# Patient Record
Sex: Male | Born: 1970 | Race: White | Hispanic: No | Marital: Married | Smoking: Never smoker
Health system: Southern US, Community
[De-identification: ages and names within clinical notes are randomized; demographics above are authoritative.]

## PROBLEM LIST (undated history)

## (undated) DIAGNOSIS — K222 Esophageal obstruction: Secondary | ICD-10-CM

## (undated) DIAGNOSIS — F32A Depression, unspecified: Secondary | ICD-10-CM

## (undated) DIAGNOSIS — F419 Anxiety disorder, unspecified: Secondary | ICD-10-CM

## (undated) DIAGNOSIS — F329 Major depressive disorder, single episode, unspecified: Secondary | ICD-10-CM

## (undated) DIAGNOSIS — K5790 Diverticulosis of intestine, part unspecified, without perforation or abscess without bleeding: Secondary | ICD-10-CM

## (undated) DIAGNOSIS — K219 Gastro-esophageal reflux disease without esophagitis: Secondary | ICD-10-CM

## (undated) DIAGNOSIS — I10 Essential (primary) hypertension: Secondary | ICD-10-CM

## (undated) DIAGNOSIS — E669 Obesity, unspecified: Secondary | ICD-10-CM

## (undated) DIAGNOSIS — K5792 Diverticulitis of intestine, part unspecified, without perforation or abscess without bleeding: Secondary | ICD-10-CM

## (undated) DIAGNOSIS — G4730 Sleep apnea, unspecified: Secondary | ICD-10-CM

## (undated) DIAGNOSIS — M479 Spondylosis, unspecified: Secondary | ICD-10-CM

## (undated) DIAGNOSIS — R011 Cardiac murmur, unspecified: Secondary | ICD-10-CM

## (undated) DIAGNOSIS — T7840XA Allergy, unspecified, initial encounter: Secondary | ICD-10-CM

## (undated) HISTORY — DX: Diverticulitis of intestine, part unspecified, without perforation or abscess without bleeding: K57.92

## (undated) HISTORY — DX: Depression, unspecified: F32.A

## (undated) HISTORY — DX: Essential (primary) hypertension: I10

## (undated) HISTORY — DX: Major depressive disorder, single episode, unspecified: F32.9

## (undated) HISTORY — DX: Obesity, unspecified: E66.9

## (undated) HISTORY — DX: Anxiety disorder, unspecified: F41.9

## (undated) HISTORY — DX: Allergy, unspecified, initial encounter: T78.40XA

## (undated) HISTORY — DX: Diverticulosis of intestine, part unspecified, without perforation or abscess without bleeding: K57.90

## (undated) HISTORY — PX: ESOPHAGEAL DILATION: SHX303

## (undated) HISTORY — DX: Sleep apnea, unspecified: G47.30

## (undated) HISTORY — PX: SHOULDER SURGERY: SHX246

## (undated) HISTORY — PX: FINGER SURGERY: SHX640

---

## 1999-03-10 ENCOUNTER — Encounter: Payer: Self-pay | Admitting: Neurosurgery

## 1999-03-10 ENCOUNTER — Encounter: Admission: RE | Admit: 1999-03-10 | Discharge: 1999-03-10 | Payer: Self-pay | Admitting: Neurosurgery

## 1999-10-07 ENCOUNTER — Ambulatory Visit (HOSPITAL_COMMUNITY): Admission: RE | Admit: 1999-10-07 | Discharge: 1999-10-07 | Payer: Self-pay | Admitting: Internal Medicine

## 1999-10-07 ENCOUNTER — Encounter: Payer: Self-pay | Admitting: Internal Medicine

## 2000-03-30 ENCOUNTER — Encounter: Payer: Self-pay | Admitting: Neurosurgery

## 2000-03-30 ENCOUNTER — Encounter: Admission: RE | Admit: 2000-03-30 | Discharge: 2000-03-30 | Payer: Self-pay | Admitting: Neurosurgery

## 2000-06-21 ENCOUNTER — Ambulatory Visit (HOSPITAL_COMMUNITY): Admission: RE | Admit: 2000-06-21 | Discharge: 2000-06-21 | Payer: Self-pay | Admitting: Family Medicine

## 2000-06-21 ENCOUNTER — Encounter: Payer: Self-pay | Admitting: Family Medicine

## 2002-07-04 ENCOUNTER — Encounter: Payer: Self-pay | Admitting: Family Medicine

## 2002-07-04 ENCOUNTER — Ambulatory Visit (HOSPITAL_COMMUNITY): Admission: RE | Admit: 2002-07-04 | Discharge: 2002-07-04 | Payer: Self-pay | Admitting: Family Medicine

## 2002-07-18 ENCOUNTER — Ambulatory Visit (HOSPITAL_BASED_OUTPATIENT_CLINIC_OR_DEPARTMENT_OTHER): Admission: RE | Admit: 2002-07-18 | Discharge: 2002-07-18 | Payer: Self-pay | Admitting: Orthopedic Surgery

## 2008-01-09 DIAGNOSIS — K219 Gastro-esophageal reflux disease without esophagitis: Secondary | ICD-10-CM | POA: Insufficient documentation

## 2008-01-09 DIAGNOSIS — K319 Disease of stomach and duodenum, unspecified: Secondary | ICD-10-CM | POA: Insufficient documentation

## 2008-01-10 ENCOUNTER — Telehealth: Payer: Self-pay | Admitting: Internal Medicine

## 2008-01-10 ENCOUNTER — Ambulatory Visit: Payer: Self-pay | Admitting: Internal Medicine

## 2008-01-10 DIAGNOSIS — R1310 Dysphagia, unspecified: Secondary | ICD-10-CM | POA: Insufficient documentation

## 2008-01-10 DIAGNOSIS — K222 Esophageal obstruction: Secondary | ICD-10-CM | POA: Insufficient documentation

## 2008-01-10 DIAGNOSIS — R1319 Other dysphagia: Secondary | ICD-10-CM | POA: Insufficient documentation

## 2008-02-08 ENCOUNTER — Ambulatory Visit: Payer: Self-pay | Admitting: Internal Medicine

## 2008-02-08 ENCOUNTER — Ambulatory Visit (HOSPITAL_COMMUNITY): Admission: RE | Admit: 2008-02-08 | Discharge: 2008-02-08 | Payer: Self-pay | Admitting: Internal Medicine

## 2008-10-12 ENCOUNTER — Encounter: Admission: RE | Admit: 2008-10-12 | Discharge: 2008-10-12 | Payer: Self-pay | Admitting: Neurosurgery

## 2009-04-09 ENCOUNTER — Encounter: Admission: RE | Admit: 2009-04-09 | Discharge: 2009-04-09 | Payer: Self-pay | Admitting: Neurosurgery

## 2009-06-24 ENCOUNTER — Encounter: Admission: RE | Admit: 2009-06-24 | Discharge: 2009-06-24 | Payer: Self-pay | Admitting: Neurosurgery

## 2009-11-20 ENCOUNTER — Ambulatory Visit (HOSPITAL_COMMUNITY): Admission: RE | Admit: 2009-11-20 | Discharge: 2009-11-20 | Payer: Self-pay | Admitting: Family Medicine

## 2009-11-20 ENCOUNTER — Encounter: Payer: Self-pay | Admitting: Cardiology

## 2009-11-20 ENCOUNTER — Ambulatory Visit: Payer: Self-pay | Admitting: Internal Medicine

## 2009-11-20 ENCOUNTER — Ambulatory Visit: Payer: Self-pay

## 2009-11-20 ENCOUNTER — Encounter: Payer: Self-pay | Admitting: Family Medicine

## 2010-04-18 ENCOUNTER — Encounter: Payer: Self-pay | Admitting: Cardiology

## 2010-04-29 ENCOUNTER — Encounter: Payer: Self-pay | Admitting: Cardiology

## 2010-04-30 ENCOUNTER — Encounter: Payer: Self-pay | Admitting: Cardiology

## 2010-04-30 ENCOUNTER — Ambulatory Visit (INDEPENDENT_AMBULATORY_CARE_PROVIDER_SITE_OTHER): Payer: PRIVATE HEALTH INSURANCE | Admitting: Cardiology

## 2010-04-30 DIAGNOSIS — R9431 Abnormal electrocardiogram [ECG] [EKG]: Secondary | ICD-10-CM

## 2010-04-30 NOTE — Progress Notes (Signed)
Exercise Treadmill Test  Pre-Exercise Testing Evaluation Rhythm: normal sinus  Rate: 67   PR:  .16 QRS:  .11  QT:  .40 QTc: .42     Test  Exercise Tolerance Test Ordering MD: Rudi Heap  Interpreting MD:  Angelina Sheriff, MD  Unique Test No: 1  Treadmill:  1  Indication for ETT: Abnormal EKG  Contraindication to ETT: No   Stress Modality: exercise - treadmill  Cardiac Imaging Performed: none  Protocol: standard Bruce - maximal  Max BP: 165/69  Max MPHR (bpm):  178 85% MPR (bpm):  151  MPHR obtained (bpm):  155 % MPHR obtained:  85  Reached 85% MPHR (min:sec): 10:30 Total Exercise Time (min-sec):  11:00  Workload in METS:  13.6 Borg Scale: 17  Reason ETT Terminated:  desired heart rate attained    ST Segment Analysis At Rest: normal ST segments - no evidence of significant ST depression With Exercise: no evidence of significant ST depression  Other Information Arrhythmia:  No Angina during ETT:  absent (0) Quality of ETT:  diagnostic  ETT Interpretation:  normal - no evidence of ischemia by ST analysis  Comments: The patient had an excellent exercise tolerance.  There was no chest pain.  There was an appropriate level of dyspnea.  There were no arrhythmias, a normal heart rate response and normal BP response.  There were no ischemic ST T wave changes and a normal heart rate recovery.  Recommendations: Negative adequate ETT.  No further testing is indicated.  Based on the above I gave the patient a prescription for exercise.

## 2010-05-05 ENCOUNTER — Encounter: Payer: Self-pay | Admitting: Family Medicine

## 2010-06-20 NOTE — Procedures (Signed)
[facility]  Patient:    Roy Lawrence, Roy Lawrence                       MRN: [id number] Proc. Date: 10/07/99 Adm. Date:  [id number] Disc. Date: [id number] Attending:  Estella Husk CC:         Monica Becton, M.D. in [place]   Procedure Report  PROCEDURE:  Esophagogastroduodenoscopy with Savary (guidewire) dilation of the esophagus with fluoroscopic assistance.  SURGEON:  Wilhemina Bonito. Eda Keys., M.D.  INDICATIONS:  Recurrent dysphagia secondary to known peptic stricture.  HISTORY:  This is a 40 year old gentleman with gastroesophageal reflux disease complicated by erosive esophagitis and peptic stricture for which he underwent prior esophageal dilation.  The last such dilation was performed March 06, 1997.  He was dilated to a maximal diameter of 20 mm.  He did well until recently when he was evaluated in the office for recurrent dysphagia.  He also had breakthrough heartburn symptoms for which he has been placed on Prevacid b.i.d.  He is now for upper endoscopy with esophageal dilation.  The nature of the procedure as well as the risks, benefits, and alternatives were reviewed.  He understood and agreed to proceed.  PHYSICAL EXAMINATION:  A well-appearing male in no acute distress.  He is alert and oriented.  Vital signs are stable.  Lungs are clear.  Heart is regular.  Abdomen is soft.  DESCRIPTION OF PROCEDURE:  After informed consent was obtained, the patient was sedated with 100 mg of Demerol and 10 mg of Versed IV.  The Olympus endoscope was passed orally under direct vision into the esophagus.  The esophagus revealed a large caliber fibrous stricture of the gastroesophageal junction.  No active esophagitis or Barretts esophagus.  The endoscope passed beyond this region without resistance.  The stomach revealed a small sliding hiatal hernia.  In addition, multiple small antral erosions.  The duodenal bulb and postbulbar duodenum were  normal.  THERAPY:  After completing the endoscopic survey, a Savary guidewire was placed into the gastric antrum under fluoroscopic control.  Subsequently, a 17, 18, and 19 mm dilators were passed sequentially over the guidewire. Fluoroscopy was utilized throughout each portion of each dilation.  No resistance was encountered with the passage of any dilator.  Scant heme present upon removal of the largest dilator.  The patient tolerated the procedure well.  IMPRESSION:  Gastroesophageal reflux disease complicated by peptic stricture, status post Savary dilation to a maximum diameter of 19 mm.  RECOMMENDATIONS: 1. NPO for two hours and then clear liquids for two hours and then a soft diet    until a.m. 2. Continue Prevacid 30 mg p.o. b.i.d. 3. GI followup as needed. DD:  10/07/99 TD:  10/08/99 Job: [id number]
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
## 2011-07-29 ENCOUNTER — Encounter: Payer: Self-pay | Admitting: Cardiology

## 2011-07-29 ENCOUNTER — Ambulatory Visit (INDEPENDENT_AMBULATORY_CARE_PROVIDER_SITE_OTHER): Payer: PRIVATE HEALTH INSURANCE | Admitting: Cardiology

## 2011-07-29 VITALS — BP 120/90 | HR 80 | Ht 69.0 in | Wt 240.0 lb

## 2011-07-29 DIAGNOSIS — I10 Essential (primary) hypertension: Secondary | ICD-10-CM

## 2011-07-29 DIAGNOSIS — R55 Syncope and collapse: Secondary | ICD-10-CM

## 2011-07-29 DIAGNOSIS — E663 Overweight: Secondary | ICD-10-CM

## 2011-07-29 NOTE — Assessment & Plan Note (Signed)
For now he can continue his Bystolic although if he has worsening symptoms we would need to get rid of this.

## 2011-07-29 NOTE — Assessment & Plan Note (Signed)
I do suspect this was a day let us. However, I would like to apply a 21 day event monitor. We discussed orthostatic symptoms and avoidance. I don't want salt loading fluid load because of his baseline hypertension.

## 2011-07-29 NOTE — Assessment & Plan Note (Signed)
The patient understands the need to lose weight with diet and exercise. We have discussed specific strategies for this.  

## 2011-07-29 NOTE — Patient Instructions (Addendum)
The current medical regimen is effective;  continue present plan and medications.  Your physician has recommended that you wear an event monitor for 21 days. Event monitors are medical devices that record the heart's electrical activity. Doctors most often [place] these monitors to diagnose arrhythmias. Arrhythmias are problems with the speed or rhythm of the heartbeat. The monitor is a small, portable device. You can wear one while you do your normal daily activities. This is usually used to diagnose what is causing palpitations/syncope (passing out).  Follow up will be based on the results of this testing.

## 2011-07-29 NOTE — Progress Notes (Signed)
HPI The patient is referred for evaluation of syncope.  I saw him in the past for an exercise treadmill test. This was done in March of last year and was unremarkable.  He had had an abnormal EKG at work that was not reproducible in the hospital. Because of a childhood murmur he also had an echocardiogram in 2011 was normal.  He presents now because of syncope it has been happening when he laughs. He has to get a good laugh he stops breathing and he becomes presyncopal and at one point he actually lost consciousness. He said he felt things going gray.   His hearing will start to go.  He doesn't feel palpitations. She's had some mild orthostasis and his wife who is a nurse after one episode did take orthostatic blood pressures and noted to be abnormal. He denies any chest pressure, neck or arm discomfort. He's not had any shortness of breath, PND or orthopnea. He's had no weight gain or edema. He does wheeze with exercise which has been chronic with his asthma.  Allergies  Allergen Reactions  . Diovan (Valsartan)   . Quinapril Hcl     Current Outpatient Prescriptions  Medication Sig Dispense Refill  . albuterol (PROVENTIL HFA;VENTOLIN HFA) 108 (90 BASE) MCG/ACT inhaler Inhale 2 puffs into the lungs every 6 (six) hours as needed.        . Ascorbic Acid (VITAMIN C) 1000 MG tablet Take 1,000 mg by mouth 2 (two) times daily.      Marland Kitchen b complex vitamins tablet Take 1 tablet by mouth daily.      . budesonide-formoterol (SYMBICORT) 160-4.5 MCG/ACT inhaler Inhale 2 puffs into the lungs daily.      . Cholecalciferol (VITAMIN D3) 5000 UNITS CAPS Take by mouth.      . dexlansoprazole (DEXILANT) 60 MG capsule Take 60 mg by mouth daily.      Marland Kitchen escitalopram (LEXAPRO) 10 MG tablet Take 10 mg by mouth daily.      . fexofenadine (ALLEGRA) 180 MG tablet Take 180 mg by mouth daily as needed.        . Magnesium Oxide (MAG-OX PO) Take by mouth daily.      . Melatonin 10 MG TABS Take by mouth daily.      .  Multiple Vitamin (MULTIVITAMIN) tablet Take 1 tablet by mouth daily.      . nebivolol (BYSTOLIC) 10 MG tablet Take 10 mg by mouth daily.      . niacin (NIASPAN) 500 MG CR tablet Take 500 mg by mouth. 3 tabs qhs       . omega-3 acid ethyl esters (LOVAZA) 1 G capsule Take 1 g by mouth 2 (two) times daily.        . Testosterone (AXIRON TD) Place 60 mcg onto the skin daily.      . traMADol (ULTRAM) 50 MG tablet Take 50 mg by mouth every 6 (six) hours as needed.        Past Medical History  Diagnosis Date  . Asthma   . Hypertension     Prehypertension    Past Surgical History  Procedure Date  . Shoulder surgery     right x 2    ROS:  As stated in the HPI and negative for all other systems.  PHYSICAL EXAM BP 120/90  Pulse 80  Ht 5\' 9"  (1.753 m)  Wt 240 lb (108.863 kg)  BMI 35.44 kg/m2 GENERAL:  Well appearing HEENT:  Pupils equal round and  reactive, fundi not visualized, oral mucosa unremarkable NECK:  No jugular venous distention, waveform within normal limits, carotid upstroke brisk and symmetric, no bruits, no thyromegaly LYMPHATICS:  No cervical, inguinal adenopathy LUNGS:  Clear to auscultation bilaterally BACK:  No CVA tenderness CHEST:  Unremarkable HEART:  PMI not displaced or sustained,S1 and S2 within normal limits, no S3, no S4, no clicks, no rubs, no murmurs ABD:  Flat, positive bowel sounds normal in frequency in pitch, no bruits, no rebound, no guarding, no midline pulsatile mass, no hepatomegaly, no splenomegaly, obese EXT:  2 plus pulses throughout, no edema, no cyanosis no clubbing SKIN:  No rashes no nodules NEURO:  Cranial nerves II through XII grossly intact, motor grossly intact throughout PSYCH:  Cognitively intact, oriented to person place and time   EKG:  Sinus rhythm, rate 80, axis within normal limits, intervals within normal limits, no acute ST-T wave changes.  ASSESSMENT AND PLAN

## 2012-04-25 ENCOUNTER — Other Ambulatory Visit: Payer: Self-pay | Admitting: Family Medicine

## 2012-04-25 ENCOUNTER — Telehealth: Payer: Self-pay | Admitting: Family Medicine

## 2012-04-25 NOTE — Telephone Encounter (Signed)
Needs rx for lexapro. [facility] [place]

## 2012-04-25 NOTE — Telephone Encounter (Signed)
Rx sent to [facility] in [place] on 04/14/2012

## 2012-04-25 NOTE — Telephone Encounter (Signed)
Pt.notified

## 2012-05-04 ENCOUNTER — Telehealth: Payer: Self-pay | Admitting: Family Medicine

## 2012-05-04 ENCOUNTER — Other Ambulatory Visit: Payer: Self-pay | Admitting: Family Medicine

## 2012-05-04 ENCOUNTER — Other Ambulatory Visit (INDEPENDENT_AMBULATORY_CARE_PROVIDER_SITE_OTHER): Payer: PRIVATE HEALTH INSURANCE

## 2012-05-04 DIAGNOSIS — R5381 Other malaise: Secondary | ICD-10-CM

## 2012-05-04 DIAGNOSIS — R7309 Other abnormal glucose: Secondary | ICD-10-CM

## 2012-05-04 DIAGNOSIS — E291 Testicular hypofunction: Secondary | ICD-10-CM

## 2012-05-04 DIAGNOSIS — I10 Essential (primary) hypertension: Secondary | ICD-10-CM

## 2012-05-04 DIAGNOSIS — K219 Gastro-esophageal reflux disease without esophagitis: Secondary | ICD-10-CM

## 2012-05-04 DIAGNOSIS — E559 Vitamin D deficiency, unspecified: Secondary | ICD-10-CM

## 2012-05-04 DIAGNOSIS — R5383 Other fatigue: Secondary | ICD-10-CM

## 2012-05-04 DIAGNOSIS — E785 Hyperlipidemia, unspecified: Secondary | ICD-10-CM

## 2012-05-04 DIAGNOSIS — E1059 Type 1 diabetes mellitus with other circulatory complications: Secondary | ICD-10-CM

## 2012-05-04 LAB — POCT CBC
Granulocyte percent: 63.8 %G (ref 37–80)
HCT, POC: 50.6 % (ref 43.5–53.7)
Hemoglobin: 16.8 g/dL (ref 14.1–18.1)
Lymph, poc: 1.5 (ref 0.6–3.4)
MCH, POC: 30 pg (ref 27–31.2)
MCHC: 33.2 g/dL (ref 31.8–35.4)
MCV: 90.3 fL (ref 80–97)
MPV: 9.9 fL (ref 0–99.8)
POC Granulocyte: 3.2 (ref 2–6.9)
POC LYMPH PERCENT: 30.1 %L (ref 10–50)
Platelet Count, POC: 104 10*3/uL — AB (ref 142–424)
RBC: 5.6 M/uL (ref 4.69–6.13)
RDW, POC: 12.8 %
WBC: 5 10*3/uL (ref 4.6–10.2)

## 2012-05-04 LAB — POCT GLYCOSYLATED HEMOGLOBIN (HGB A1C): Hemoglobin A1C: 5.1

## 2012-05-04 NOTE — Telephone Encounter (Signed)
Have him come in and be seen. For a visit. Then we can set things up.

## 2012-05-04 NOTE — Progress Notes (Unsigned)
Patient came in today for labs only. Has an appointment next week with doctor moore.

## 2012-05-04 NOTE — Telephone Encounter (Signed)
Please advise 

## 2012-05-05 ENCOUNTER — Other Ambulatory Visit: Payer: Self-pay | Admitting: *Deleted

## 2012-05-05 LAB — BASIC METABOLIC PANEL
BUN: 15 mg/dL (ref 6–23)
CO2: 31 mEq/L (ref 19–32)
Calcium: 9.6 mg/dL (ref 8.4–10.5)
Chloride: 103 mEq/L (ref 96–112)
Creat: 1.07 mg/dL (ref 0.50–1.35)
Glucose, Bld: 95 mg/dL (ref 70–99)
Potassium: 4.7 mEq/L (ref 3.5–5.3)
Sodium: 140 mEq/L (ref 135–145)

## 2012-05-05 LAB — HEPATIC FUNCTION PANEL
ALT: 28 U/L (ref 0–53)
AST: 20 U/L (ref 0–37)
Albumin: 4.7 g/dL (ref 3.5–5.2)
Alkaline Phosphatase: 47 U/L (ref 39–117)
Bilirubin, Direct: 0.2 mg/dL (ref 0.0–0.3)
Indirect Bilirubin: 0.5 mg/dL (ref 0.0–0.9)
Total Bilirubin: 0.7 mg/dL (ref 0.3–1.2)
Total Protein: 6.7 g/dL (ref 6.0–8.3)

## 2012-05-05 LAB — VITAMIN D 25 HYDROXY (VIT D DEFICIENCY, FRACTURES): Vit D, 25-Hydroxy: 46 ng/mL (ref 30–89)

## 2012-05-05 MED ORDER — ALPRAZOLAM 0.5 MG PO TABS: 0.5000 mg | ORAL_TABLET | Freq: Two times a day (BID) | ORAL | Status: DC | PRN

## 2012-05-05 NOTE — Telephone Encounter (Signed)
Pt requesting refill on xanax, last seen 11/17/11, last filled on that date. This will print, so have kay to call pt. At [phone] to pick up

## 2012-05-06 LAB — NMR LIPOPROFILE WITH LIPIDS
Cholesterol, Total: 132 mg/dL (ref <200)
HDL Particle Number: 28 umol/L — ABNORMAL LOW (ref >=30.5)
HDL Size: 8.5 nm — ABNORMAL LOW (ref >=9.2)
HDL-C: 38 mg/dL — ABNORMAL LOW (ref >=40)
LDL (calc): 72 mg/dL (ref <100)
LDL Particle Number: 927 nmol/L (ref <1000)
LDL Size: 20.4 nm — ABNORMAL LOW (ref >20.5)
LP-IR Score: 46 — ABNORMAL HIGH (ref <=45)
Large HDL-P: 1.3 umol/L — ABNORMAL LOW (ref >=4.8)
Large VLDL-P: <0.8 nmol/L (ref <=2.7)
Small LDL Particle Number: 442 nmol/L (ref <=527)
Triglycerides: 111 mg/dL (ref <150)
VLDL Size: 41.7 nm (ref <=46.6)

## 2012-05-06 LAB — TESTOSTERONE, TOTAL AND FREE DIRECT MEASURE
Free Testosterone, Direct: 13.5 pg/mL (ref 3.8–34.2)
Testosterone: 723 ng/dL (ref 300–890)

## 2012-05-06 NOTE — Telephone Encounter (Signed)
Script approved by Dr. Christell Constant and called to [facility] in [place].

## 2012-05-09 ENCOUNTER — Encounter: Payer: Self-pay | Admitting: Family Medicine

## 2012-05-09 ENCOUNTER — Ambulatory Visit (INDEPENDENT_AMBULATORY_CARE_PROVIDER_SITE_OTHER): Payer: PRIVATE HEALTH INSURANCE | Admitting: Family Medicine

## 2012-05-09 ENCOUNTER — Ambulatory Visit (INDEPENDENT_AMBULATORY_CARE_PROVIDER_SITE_OTHER): Payer: PRIVATE HEALTH INSURANCE

## 2012-05-09 VITALS — BP 101/65 | HR 74 | Temp 97.8°F | Ht 69.0 in | Wt 245.0 lb

## 2012-05-09 DIAGNOSIS — G8929 Other chronic pain: Secondary | ICD-10-CM

## 2012-05-09 DIAGNOSIS — M25579 Pain in unspecified ankle and joints of unspecified foot: Secondary | ICD-10-CM

## 2012-05-09 DIAGNOSIS — K219 Gastro-esophageal reflux disease without esophagitis: Secondary | ICD-10-CM

## 2012-05-09 DIAGNOSIS — E349 Endocrine disorder, unspecified: Secondary | ICD-10-CM | POA: Insufficient documentation

## 2012-05-09 DIAGNOSIS — M722 Plantar fascial fibromatosis: Secondary | ICD-10-CM

## 2012-05-09 DIAGNOSIS — I10 Essential (primary) hypertension: Secondary | ICD-10-CM

## 2012-05-09 DIAGNOSIS — E291 Testicular hypofunction: Secondary | ICD-10-CM

## 2012-05-09 LAB — PSA: PSA: 0.84 ng/mL (ref <=4.00)

## 2012-05-09 NOTE — Progress Notes (Signed)
  Subjective:    Patient ID: Roy Lawrence, male    DOB: 11-09-1970, 42 y.o.   MRN: [id number]  HPI This patient presents for recheck of multiple medical problems. No one accompanies the patient today.  Patient Active Problem List  Diagnosis  . ESOPHAGEAL STRICTURE  . GERD  . PEPTIC STRICTURE  . DYSPHAGIA UNSPECIFIED  . DYSPHAGIA  . Syncope  . HTN (hypertension)  . Overweight  . Testosterone deficiency    In addition, see ROS, he also has spondylolisthesis and this is followed by Dr. Channing Mutters q 6 months  The allergies, current medications, past medical history, surgical history, family and social history are reviewed.  Immunizations reviewed.  Health maintenance reviewed.  The following items are outstanding: none      Review of Systems  HENT: Positive for congestion, sneezing (due to allergies), postnasal drip and sinus pressure. Negative for ear pain.   Eyes: Negative.   Respiratory: Positive for wheezing (intermitent). Negative for cough and shortness of breath.   Cardiovascular: Negative.   Gastrointestinal: Negative.   Genitourinary: Negative.   Musculoskeletal: Positive for arthralgias (knees,ankles, R hip, R foot) and gait problem (due to foot and hip pain).  Skin: Negative.   Allergic/Immunologic: Positive for environmental allergies.  Neurological: Negative.   Psychiatric/Behavioral: Positive for sleep disturbance (nightly, shift work).       Objective:   Physical Exam BP 101/65  Pulse 74  Temp(Src) 97.8 F (36.6 C) (Oral)  Ht 5\' 9"  (1.753 m)  Wt 245 lb (111.131 kg)  BMI 36.16 kg/m2  The patient appeared well nourished and normally developed, alert and oriented to time and place. Speech, behavior and judgement appear normal. Vital signs as documented.  Head exam is unremarkable. No scleral icterus or pallor noted.  Neck is without jugular venous distension, thyromegally, or carotid bruits. Carotid upstrokes are brisk bilaterally. No cervical  adenopathy. Lungs are clear anteriorly and posteriorly to auscultation. Normal respiratory effort. Cardiac exam reveals regular rate and rhythm. First and second heart sounds normal. No murmurs, rubs or gallops.  Abdominal exam reveals normal bowl sounds, no masses, no organomegaly and no aortic enlargement. No inguinal adenopathy. Extremities are nonedematous and both femoral and pedal pulses are normal. Skin without pallor or jaundice.  Warm and dry, without rash. Neurologic exam reveals normal deep tendon reflexes and normal sensation.   [facility] reading (PRIMARY) by  Dr.Don Christell Constant  x-ray appears normal, there is no  heel spur present                                       Assessment & Plan:  1. GERD Continue PPI  2. HTN (hypertension) Continue current meds  3. Testosterone deficiency  4. Heel pain, chronic, right - DG Foot Complete Right; Future Moist heat and consider firm sole shoes as mentioned  5. Plantar fasciitis of right foot As above

## 2012-05-09 NOTE — Patient Instructions (Addendum)
FOBT already has, will return Continue current meds and therapeutic lifestyle changes

## 2012-05-19 NOTE — Telephone Encounter (Signed)
Resolved.  Patient was seen for an office visit.

## 2012-05-21 ENCOUNTER — Other Ambulatory Visit: Payer: Self-pay | Admitting: Family Medicine

## 2012-05-23 ENCOUNTER — Other Ambulatory Visit: Payer: Self-pay | Admitting: Nurse Practitioner

## 2012-05-23 NOTE — Telephone Encounter (Signed)
LAST OV 10/13 

## 2012-05-27 ENCOUNTER — Ambulatory Visit: Payer: PRIVATE HEALTH INSURANCE | Admitting: Nurse Practitioner

## 2012-06-16 ENCOUNTER — Other Ambulatory Visit: Payer: Self-pay | Admitting: Family Medicine

## 2012-06-17 NOTE — Telephone Encounter (Signed)
This is okay with a refills designated

## 2012-06-17 NOTE — Telephone Encounter (Signed)
Last filled 05/13/12,

## 2012-06-18 ENCOUNTER — Other Ambulatory Visit: Payer: Self-pay | Admitting: Family Medicine

## 2012-06-20 ENCOUNTER — Other Ambulatory Visit: Payer: Self-pay | Admitting: Family Medicine

## 2012-06-21 NOTE — Telephone Encounter (Signed)
RX called into [facility] Pt notified

## 2012-07-16 ENCOUNTER — Other Ambulatory Visit: Payer: Self-pay | Admitting: Family Medicine

## 2012-07-19 ENCOUNTER — Other Ambulatory Visit: Payer: Self-pay | Admitting: *Deleted

## 2012-07-19 MED ORDER — NIACIN ER (ANTIHYPERLIPIDEMIC) 500 MG PO TBCR: 500.0000 mg | EXTENDED_RELEASE_TABLET | Freq: Four times a day (QID) | ORAL | Status: DC

## 2012-08-22 ENCOUNTER — Other Ambulatory Visit: Payer: Self-pay | Admitting: Family Medicine

## 2012-08-24 NOTE — Telephone Encounter (Signed)
These are OK to refill 

## 2012-08-24 NOTE — Telephone Encounter (Signed)
Last seen 05/09/12  DWM   If approved print and have nurse call patient to pick up

## 2012-08-25 ENCOUNTER — Other Ambulatory Visit: Payer: Self-pay | Admitting: Family Medicine

## 2012-08-25 NOTE — Telephone Encounter (Signed)
Pt notified Rx's sent to pharmacy. 

## 2012-10-13 ENCOUNTER — Other Ambulatory Visit: Payer: Self-pay | Admitting: Family Medicine

## 2012-10-14 NOTE — Telephone Encounter (Signed)
Last seen 05/09/12  DWM  If approved route to nurse to call into [facility]

## 2012-10-15 NOTE — Telephone Encounter (Signed)
This is ok with 5 refills

## 2012-10-18 NOTE — Telephone Encounter (Signed)
Called into [facility] in [place]

## 2012-10-31 ENCOUNTER — Other Ambulatory Visit: Payer: Self-pay | Admitting: Family Medicine

## 2012-11-09 ENCOUNTER — Ambulatory Visit (INDEPENDENT_AMBULATORY_CARE_PROVIDER_SITE_OTHER): Payer: PRIVATE HEALTH INSURANCE

## 2012-11-09 ENCOUNTER — Ambulatory Visit (INDEPENDENT_AMBULATORY_CARE_PROVIDER_SITE_OTHER): Payer: PRIVATE HEALTH INSURANCE | Admitting: Family Medicine

## 2012-11-09 ENCOUNTER — Encounter: Payer: Self-pay | Admitting: Family Medicine

## 2012-11-09 VITALS — BP 111/72 | HR 52 | Temp 97.0°F | Ht 69.0 in | Wt 247.0 lb

## 2012-11-09 DIAGNOSIS — K219 Gastro-esophageal reflux disease without esophagitis: Secondary | ICD-10-CM

## 2012-11-09 DIAGNOSIS — Z8709 Personal history of other diseases of the respiratory system: Secondary | ICD-10-CM

## 2012-11-09 DIAGNOSIS — E291 Testicular hypofunction: Secondary | ICD-10-CM

## 2012-11-09 DIAGNOSIS — F411 Generalized anxiety disorder: Secondary | ICD-10-CM

## 2012-11-09 DIAGNOSIS — I10 Essential (primary) hypertension: Secondary | ICD-10-CM

## 2012-11-09 DIAGNOSIS — M545 Low back pain, unspecified: Secondary | ICD-10-CM

## 2012-11-09 DIAGNOSIS — M4316 Spondylolisthesis, lumbar region: Secondary | ICD-10-CM | POA: Insufficient documentation

## 2012-11-09 DIAGNOSIS — E559 Vitamin D deficiency, unspecified: Secondary | ICD-10-CM

## 2012-11-09 DIAGNOSIS — E349 Endocrine disorder, unspecified: Secondary | ICD-10-CM

## 2012-11-09 DIAGNOSIS — R1319 Other dysphagia: Secondary | ICD-10-CM

## 2012-11-09 DIAGNOSIS — F419 Anxiety disorder, unspecified: Secondary | ICD-10-CM | POA: Insufficient documentation

## 2012-11-09 DIAGNOSIS — M4310 Spondylolisthesis, site unspecified: Secondary | ICD-10-CM

## 2012-11-09 LAB — POCT CBC
Granulocyte percent: 57.4 %G (ref 37–80)
HCT, POC: 49.8 % (ref 43.5–53.7)
Hemoglobin: 16.7 g/dL (ref 14.1–18.1)
Lymph, poc: 1.9 (ref 0.6–3.4)
MCH, POC: 29.5 pg (ref 27–31.2)
MCHC: 33.5 g/dL (ref 31.8–35.4)
MCV: 87.8 fL (ref 80–97)
MPV: 9.3 fL (ref 0–99.8)
POC Granulocyte: 3.1 (ref 2–6.9)
POC LYMPH PERCENT: 35.6 %L (ref 10–50)
Platelet Count, POC: 114 10*3/uL — AB (ref 142–424)
RBC: 5.7 M/uL (ref 4.69–6.13)
RDW, POC: 12.4 %
WBC: 5.4 10*3/uL (ref 4.6–10.2)

## 2012-11-09 NOTE — Progress Notes (Signed)
Subjective:    Patient ID: Roy Lawrence, male    DOB: 08/22/1970, 42 y.o.   MRN: [id number]  HPI Pt here for follow up and management of chronic medical problems. Patient is doing well with his multiple problems. He visits with the chiropractor and this has helped his back. His asthma has been stable using the long acting bronchodilator with rescue help from albuterol. He visits with the neurosurgeon about every 6 months for his spondylolisthesis.     Patient Active Problem List   Diagnosis Date Noted  . Testosterone deficiency 05/09/2012  . Syncope 07/29/2011  . HTN (hypertension) 07/29/2011  . Overweight 07/29/2011  . ESOPHAGEAL STRICTURE 01/10/2008  . DYSPHAGIA UNSPECIFIED 01/10/2008  . DYSPHAGIA 01/10/2008  . GERD 01/09/2008  . PEPTIC STRICTURE 01/09/2008   Outpatient Encounter Prescriptions as of 11/09/2012  Medication Sig Dispense Refill  . albuterol (VENTOLIN HFA) 108 (90 BASE) MCG/ACT inhaler Inhale 1 puff into the lungs every 6 (six) hours as needed for wheezing.  18 each  3  . ALPRAZolam (XANAX) 0.5 MG tablet TAKE ONE TABLET BY MOUTH TWICE DAILY AS NEEDED  60 tablet  0  . Ascorbic Acid (VITAMIN C) 1000 MG tablet Take 1,000 mg by mouth 2 (two) times daily.      Randa Ngo 30 MG/ACT SOLN APPLY TWO PUMPS OF SOLUTION TOPICALLY TO EACH ARM ONCE A DAY  90 mL  0  . budesonide-formoterol (SYMBICORT) 160-4.5 MCG/ACT inhaler Inhale 2 puffs into the lungs daily.      Marland Kitchen BYSTOLIC 20 MG TABS TAKE ONE TABLET BY MOUTH EVERY DAY  30 each  2  . Cholecalciferol (VITAMIN D3) 5000 UNITS CAPS Take by mouth.      . escitalopram (LEXAPRO) 20 MG tablet One half to one tablet daily as directed  30 tablet  5  . fexofenadine (ALLEGRA) 180 MG tablet Take 180 mg by mouth daily as needed.        . Magnesium Oxide (MAG-OX PO) Take by mouth daily.      . Melatonin 10 MG TABS Take by mouth daily.      . Multiple Vitamin (MULTIVITAMIN) tablet Take 1 tablet by mouth daily.      Marland Kitchen omega-3 acid ethyl esters  (LOVAZA) 1 G capsule TAKE ONE CAPSULE BY MOUTH TWICE DAILY  60 capsule  5  . traMADol (ULTRAM) 50 MG tablet Take 50 mg by mouth every 6 (six) hours as needed.      . [DISCONTINUED] b complex vitamins tablet Take 1 tablet by mouth daily.      . [DISCONTINUED] niacin (NIASPAN) 500 MG CR tablet Take 1 tablet (500 mg total) by mouth 4 (four) times daily.  120 tablet  4  . DEXILANT 60 MG capsule TAKE ONE CAPSULE BY MOUTH EVERY DAY  30 capsule  5  . [DISCONTINUED] LYSINE HCL PO Take 1 tablet by mouth daily.       No facility-administered encounter medications on file as of 11/09/2012.    Review of Systems  Constitutional: Negative.   HENT: Negative.   Eyes: Negative.   Respiratory: Negative.   Cardiovascular: Negative.   Gastrointestinal: Negative.   Endocrine: Negative.   Genitourinary: Negative.   Musculoskeletal: Negative.   Skin: Negative.   Allergic/Immunologic: Negative.   Neurological: Negative.   Hematological: Negative.   Psychiatric/Behavioral: Negative.        Objective:   Physical Exam  Nursing note and vitals reviewed. Constitutional: He is oriented to person, place, and time. He  appears well-developed and well-nourished. No distress.  Calm  HENT:  Head: Normocephalic and atraumatic.  Right Ear: External ear normal.  Left Ear: External ear normal.  Nose: Nose normal.  Mouth/Throat: Oropharynx is clear and moist. No oropharyngeal exudate.  Eyes: Conjunctivae and EOM are normal. Pupils are equal, round, and reactive to light. Right eye exhibits no discharge. Left eye exhibits no discharge. No scleral icterus.  Neck: Normal range of motion. Neck supple. No tracheal deviation present. No thyromegaly present.  Cardiovascular: Normal rate, regular rhythm, normal heart sounds and intact distal pulses.  Exam reveals no gallop and no friction rub.   No murmur heard. At 72 per minute  Pulmonary/Chest: Effort normal and breath sounds normal. No respiratory distress. He has no  wheezes. He has no rales.  Abdominal: Soft. Bowel sounds are normal. He exhibits no distension and no mass. There is no tenderness. There is no rebound and no guarding.  Mild obesity  Genitourinary: Rectum normal, prostate normal and penis normal.  No rectal masses , no prostate nodules, no prostate enlargement, no hernia  Musculoskeletal: Normal range of motion. He exhibits no edema and no tenderness.  Lymphadenopathy:    He has no cervical adenopathy.  Neurological: He is alert and oriented to person, place, and time. He has normal reflexes. No cranial nerve deficit.  Skin: Skin is warm and dry. No rash noted. No erythema. No pallor.  Psychiatric: He has a normal mood and affect. His behavior is normal. Judgment and thought content normal.   BP 111/72  Pulse 52  Temp(Src) 97 F (36.1 C) (Oral)  Ht 5\' 9"  (1.753 m)  Wt 247 lb (112.038 kg)  BMI 36.46 kg/m2  [facility] reading (PRIMARY) by  Dr.Moore; chest x-ray:  Within normal limit                                    Assessment & Plan:    1. GERD   2. DYSPHAGIA   3. HTN (hypertension)   4. Testosterone deficiency   5. Vitamin D deficiency   6. Anxiety   7. History of asthma   8. Low back pain syndrome   9. Spondylolisthesis of lumbar region    Orders Placed This Encounter  Procedures  . DG Chest 2 View    Standing Status: Future     Number of Occurrences:      Standing Expiration Date: 01/09/2014    Order Specific Question:  Reason for Exam (SYMPTOM  OR DIAGNOSIS REQUIRED)    Answer:  htn    Order Specific Question:  Preferred imaging location?    Answer:  Internal  . Hepatic function panel  . BMP8+EGFR  . NMR, lipoprofile  . Testosterone,Free and Total  . Vit D  25 hydroxy (rtn osteoporosis monitoring)  . PSA, total and free  . POCT CBC   Patient Instructions  Continue current medications. Continue good therapeutic lifestyle changes.  Fall precautions discussed with patient. Schedule your flu vaccine the first of  October. Follow up as planned and earlier as needed.     Nyra Capes MD

## 2012-11-09 NOTE — Patient Instructions (Signed)
Continue current medications. Continue good therapeutic lifestyle changes.  Fall precautions discussed with patient. Schedule your flu vaccine the first of October. Follow up as planned and earlier as needed.

## 2012-11-11 ENCOUNTER — Other Ambulatory Visit (INDEPENDENT_AMBULATORY_CARE_PROVIDER_SITE_OTHER): Payer: PRIVATE HEALTH INSURANCE

## 2012-11-11 DIAGNOSIS — Z1212 Encounter for screening for malignant neoplasm of rectum: Secondary | ICD-10-CM

## 2012-11-11 NOTE — Progress Notes (Signed)
Pt dropped of FOBT only

## 2012-11-12 LAB — HEPATIC FUNCTION PANEL
ALT: 64 IU/L — ABNORMAL HIGH (ref 0–44)
AST: 34 IU/L (ref 0–40)
Albumin: 4.7 g/dL (ref 3.5–5.5)
Alkaline Phosphatase: 52 IU/L (ref 39–117)
Bilirubin, Direct: 0.13 mg/dL (ref 0.00–0.40)
Total Bilirubin: 0.5 mg/dL (ref 0.0–1.2)
Total Protein: 6.6 g/dL (ref 6.0–8.5)

## 2012-11-12 LAB — BMP8+EGFR
BUN/Creatinine Ratio: 13 (ref 9–20)
BUN: 15 mg/dL (ref 6–24)
CO2: 27 mmol/L (ref 18–29)
Calcium: 9.6 mg/dL (ref 8.7–10.2)
Chloride: 100 mmol/L (ref 97–108)
Creatinine, Ser: 1.18 mg/dL (ref 0.76–1.27)
GFR calc Af Amer: 86 mL/min/{1.73_m2} (ref >59)
GFR calc non Af Amer: 75 mL/min/{1.73_m2} (ref >59)
Glucose: 87 mg/dL (ref 65–99)
Potassium: 4.7 mmol/L (ref 3.5–5.2)
Sodium: 141 mmol/L (ref 134–144)

## 2012-11-12 LAB — TESTOSTERONE,FREE AND TOTAL
Testosterone, Free: 24 pg/mL — ABNORMAL HIGH (ref 6.8–21.5)
Testosterone: 802 ng/dL (ref 348–1197)

## 2012-11-12 LAB — NMR, LIPOPROFILE
Cholesterol: 161 mg/dL (ref <200)
HDL Cholesterol by NMR: 38 mg/dL — ABNORMAL LOW (ref >=40)
HDL Particle Number: 28.1 umol/L — ABNORMAL LOW (ref >=30.5)
LDL Particle Number: 1493 nmol/L — ABNORMAL HIGH (ref <1000)
LDL Size: 19.9 nm — ABNORMAL LOW (ref >20.5)
LDLC SERPL CALC-MCNC: 96 mg/dL (ref <100)
LP-IR Score: 70 — ABNORMAL HIGH (ref <=45)
Small LDL Particle Number: 1086 nmol/L — ABNORMAL HIGH (ref <=527)
Triglycerides by NMR: 137 mg/dL (ref <150)

## 2012-11-12 LAB — PSA, TOTAL AND FREE
PSA, Free Pct: 42.2 %
PSA, Free: 0.38 ng/mL
PSA: 0.9 ng/mL (ref 0.0–4.0)

## 2012-11-12 LAB — VITAMIN D 25 HYDROXY (VIT D DEFICIENCY, FRACTURES): Vit D, 25-Hydroxy: 45 ng/mL (ref 30.0–100.0)

## 2012-11-13 LAB — FECAL OCCULT BLOOD, IMMUNOCHEMICAL: Fecal Occult Bld: NEGATIVE

## 2012-11-15 ENCOUNTER — Encounter: Payer: Self-pay | Admitting: *Deleted

## 2012-11-16 ENCOUNTER — Telehealth: Payer: Self-pay | Admitting: Family Medicine

## 2012-11-18 NOTE — Telephone Encounter (Signed)
Lm again today 

## 2012-11-23 ENCOUNTER — Telehealth: Payer: Self-pay | Admitting: Family Medicine

## 2012-11-24 NOTE — Telephone Encounter (Signed)
Spoke with pt regarding lab results and results released to my chart

## 2012-11-26 ENCOUNTER — Other Ambulatory Visit: Payer: Self-pay | Admitting: Family Medicine

## 2012-11-29 NOTE — Telephone Encounter (Signed)
This is okay for 6 more months

## 2012-11-29 NOTE — Telephone Encounter (Signed)
Last filled 08/22/12, last seen 11/09/12. Route to pool A, call into [place] [phone]

## 2012-11-30 ENCOUNTER — Other Ambulatory Visit: Payer: Self-pay | Admitting: Family Medicine

## 2012-11-30 NOTE — Telephone Encounter (Signed)
RX for Axiron called into [facility]

## 2012-12-01 NOTE — Telephone Encounter (Signed)
Last seen 10/8 14  DWM  If approved route to nurse to phone into [facility]  [phone]

## 2012-12-04 ENCOUNTER — Emergency Department (INDEPENDENT_AMBULATORY_CARE_PROVIDER_SITE_OTHER)
Admission: EM | Admit: 2012-12-04 | Discharge: 2012-12-04 | Disposition: A | Discharge status: Nursing Home (Includes ICF) | Payer: PRIVATE HEALTH INSURANCE | Source: Home / Self Care | Attending: Emergency Medicine | Admitting: Emergency Medicine

## 2012-12-04 ENCOUNTER — Encounter (HOSPITAL_COMMUNITY): Payer: Self-pay | Admitting: Emergency Medicine

## 2012-12-04 ENCOUNTER — Emergency Department (HOSPITAL_COMMUNITY): Payer: PRIVATE HEALTH INSURANCE

## 2012-12-04 ENCOUNTER — Emergency Department (HOSPITAL_COMMUNITY)
Admission: EM | Admit: 2012-12-04 | Discharge: 2012-12-04 | Disposition: A | Discharge status: Home / Self Care | Payer: PRIVATE HEALTH INSURANCE | Attending: Emergency Medicine | Admitting: Emergency Medicine

## 2012-12-04 DIAGNOSIS — J45909 Unspecified asthma, uncomplicated: Secondary | ICD-10-CM | POA: Insufficient documentation

## 2012-12-04 DIAGNOSIS — K5732 Diverticulitis of large intestine without perforation or abscess without bleeding: Secondary | ICD-10-CM

## 2012-12-04 DIAGNOSIS — I10 Essential (primary) hypertension: Secondary | ICD-10-CM | POA: Insufficient documentation

## 2012-12-04 DIAGNOSIS — K5792 Diverticulitis of intestine, part unspecified, without perforation or abscess without bleeding: Secondary | ICD-10-CM

## 2012-12-04 DIAGNOSIS — Z79899 Other long term (current) drug therapy: Secondary | ICD-10-CM | POA: Insufficient documentation

## 2012-12-04 DIAGNOSIS — IMO0002 Reserved for concepts with insufficient information to code with codable children: Secondary | ICD-10-CM | POA: Insufficient documentation

## 2012-12-04 HISTORY — DX: Cardiac murmur, unspecified: R01.1

## 2012-12-04 HISTORY — DX: Gastro-esophageal reflux disease without esophagitis: K21.9

## 2012-12-04 HISTORY — DX: Spondylosis, unspecified: M47.9

## 2012-12-04 HISTORY — DX: Esophageal obstruction: K22.2

## 2012-12-04 LAB — CBC WITH DIFFERENTIAL/PLATELET
Basophils Absolute: 0 10*3/uL (ref 0.0–0.1)
Basophils Relative: 0 % (ref 0–1)
Eosinophils Absolute: 0 10*3/uL (ref 0.0–0.7)
Eosinophils Relative: 0 % (ref 0–5)
HCT: 46.9 % (ref 39.0–52.0)
Hemoglobin: 17.1 g/dL — ABNORMAL HIGH (ref 13.0–17.0)
Lymphocytes Relative: 17 % (ref 12–46)
Lymphs Abs: 1.8 10*3/uL (ref 0.7–4.0)
MCH: 30.5 pg (ref 26.0–34.0)
MCHC: 36.5 g/dL — ABNORMAL HIGH (ref 30.0–36.0)
MCV: 83.8 fL (ref 78.0–100.0)
Monocytes Absolute: 0.8 10*3/uL (ref 0.1–1.0)
Monocytes Relative: 7 % (ref 3–12)
Neutro Abs: 8.3 10*3/uL — ABNORMAL HIGH (ref 1.7–7.7)
Neutrophils Relative %: 76 % (ref 43–77)
Platelets: 130 10*3/uL — ABNORMAL LOW (ref 150–400)
RBC: 5.6 MIL/uL (ref 4.22–5.81)
RDW: 11.8 % (ref 11.5–15.5)
WBC: 10.9 10*3/uL — ABNORMAL HIGH (ref 4.0–10.5)

## 2012-12-04 LAB — COMPREHENSIVE METABOLIC PANEL
ALT: 73 U/L — ABNORMAL HIGH (ref 0–53)
AST: 33 U/L (ref 0–37)
Albumin: 4.4 g/dL (ref 3.5–5.2)
Alkaline Phosphatase: 64 U/L (ref 39–117)
BUN: 13 mg/dL (ref 6–23)
CO2: 28 mEq/L (ref 19–32)
Calcium: 9.5 mg/dL (ref 8.4–10.5)
Chloride: 100 mEq/L (ref 96–112)
Creatinine, Ser: 1.02 mg/dL (ref 0.50–1.35)
GFR calc Af Amer: >90 mL/min (ref >90)
GFR calc non Af Amer: 88 mL/min — ABNORMAL LOW (ref >90)
Glucose, Bld: 92 mg/dL (ref 70–99)
Potassium: 3.9 mEq/L (ref 3.5–5.1)
Sodium: 138 mEq/L (ref 135–145)
Total Bilirubin: 0.6 mg/dL (ref 0.3–1.2)
Total Protein: 7.3 g/dL (ref 6.0–8.3)

## 2012-12-04 LAB — URINALYSIS, ROUTINE W REFLEX MICROSCOPIC
Bilirubin Urine: NEGATIVE
Glucose, UA: NEGATIVE mg/dL
Hgb urine dipstick: NEGATIVE
Ketones, ur: NEGATIVE mg/dL
Leukocytes, UA: NEGATIVE
Nitrite: NEGATIVE
Protein, ur: NEGATIVE mg/dL
Specific Gravity, Urine: 1.013 (ref 1.005–1.030)
Urobilinogen, UA: 0.2 mg/dL (ref 0.0–1.0)
pH: 6.5 (ref 5.0–8.0)

## 2012-12-04 LAB — POCT URINALYSIS DIP (DEVICE)
Bilirubin Urine: NEGATIVE
Glucose, UA: NEGATIVE mg/dL
Hgb urine dipstick: NEGATIVE
Ketones, ur: NEGATIVE mg/dL
Leukocytes, UA: NEGATIVE
Nitrite: NEGATIVE
Protein, ur: NEGATIVE mg/dL
Specific Gravity, Urine: 1.015 (ref 1.005–1.030)
Urobilinogen, UA: 0.2 mg/dL (ref 0.0–1.0)
pH: 6.5 (ref 5.0–8.0)

## 2012-12-04 LAB — LIPASE, BLOOD: Lipase: 30 U/L (ref 11–59)

## 2012-12-04 MED ORDER — IOHEXOL 300 MG/ML  SOLN: 25.0000 mL | INTRAMUSCULAR | Status: DC | PRN

## 2012-12-04 MED ORDER — ONDANSETRON 4 MG PO TBDP
8.0000 mg | ORAL_TABLET | Freq: Once | ORAL | Status: AC
  Administered 2012-12-04: 8 mg via ORAL

## 2012-12-04 MED ORDER — CIPROFLOXACIN HCL 500 MG PO TABS: 500.0000 mg | ORAL_TABLET | Freq: Two times a day (BID) | ORAL | Status: DC

## 2012-12-04 MED ORDER — CIPROFLOXACIN IN D5W 400 MG/200ML IV SOLN
400.0000 mg | Freq: Once | INTRAVENOUS | Status: AC
  Administered 2012-12-04: 400 mg via INTRAVENOUS
  Filled 2012-12-04: qty 200

## 2012-12-04 MED ORDER — ONDANSETRON 4 MG PO TBDP
ORAL_TABLET | ORAL | Status: AC
  Filled 2012-12-04: qty 1

## 2012-12-04 MED ORDER — HYDROMORPHONE HCL PF 1 MG/ML IJ SOLN
1.0000 mg | Freq: Once | INTRAMUSCULAR | Status: AC
  Administered 2012-12-04: 1 mg via INTRAVENOUS
  Filled 2012-12-04: qty 1

## 2012-12-04 MED ORDER — METRONIDAZOLE IN NACL 5-0.79 MG/ML-% IV SOLN
500.0000 mg | Freq: Once | INTRAVENOUS | Status: AC
  Administered 2012-12-04: 500 mg via INTRAVENOUS
  Filled 2012-12-04: qty 100

## 2012-12-04 MED ORDER — IOHEXOL 300 MG/ML  SOLN
100.0000 mL | Freq: Once | INTRAMUSCULAR | Status: AC | PRN
  Administered 2012-12-04: 100 mL via INTRAVENOUS

## 2012-12-04 MED ORDER — METRONIDAZOLE 500 MG PO TABS: 500.0000 mg | ORAL_TABLET | Freq: Three times a day (TID) | ORAL | Status: DC

## 2012-12-04 MED ORDER — SODIUM CHLORIDE 0.9 % IV SOLN
Freq: Once | INTRAVENOUS | Status: AC
  Administered 2012-12-04: 18:00:00 via INTRAVENOUS

## 2012-12-04 MED ORDER — SODIUM CHLORIDE 0.9 % IV BOLUS (SEPSIS)
1000.0000 mL | Freq: Once | INTRAVENOUS | Status: AC
  Administered 2012-12-04: 1000 mL via INTRAVENOUS

## 2012-12-04 MED ORDER — HYDROMORPHONE HCL PF 1 MG/ML IJ SOLN
INTRAMUSCULAR | Status: AC
  Filled 2012-12-04: qty 2

## 2012-12-04 MED ORDER — OXYCODONE-ACETAMINOPHEN 5-325 MG PO TABS: 1.0000 | ORAL_TABLET | ORAL | Status: DC | PRN

## 2012-12-04 MED ORDER — HYDROMORPHONE HCL PF 1 MG/ML IJ SOLN
2.0000 mg | Freq: Once | INTRAMUSCULAR | Status: AC
  Administered 2012-12-04: 2 mg via INTRAMUSCULAR

## 2012-12-04 MED ORDER — KETOROLAC TROMETHAMINE 30 MG/ML IJ SOLN
30.0000 mg | Freq: Once | INTRAMUSCULAR | Status: AC
  Administered 2012-12-04: 30 mg via INTRAVENOUS
  Filled 2012-12-04: qty 1

## 2012-12-04 NOTE — ED Provider Notes (Signed)
Chief Complaint:   Chief Complaint  Patient presents with  . 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 It does not radiate. He's had mild pressure in this area for the past 2 days. He denies any fever, chills, nausea, or vomiting his appetite is good. He denies constipation, diarrhea, melena, or hematochezia. He has had some urinary frequency but denies any urgency, dysuria, or hematuria. He has had no history of diverticulitis, diverticulosis, kidney stones, kidney infections.  Review of Systems:  Other than noted above, the patient denies any of the following symptoms: Constitutional:  No fever, chills, fatigue, weight loss or anorexia. Lungs:  No cough or shortness of breath. Heart:  No chest pain, palpitations, syncope or edema.  No cardiac history. Abdomen:  No nausea, vomiting, hematememesis, melena, diarrhea, or hematochezia. GU:  No dysuria, frequency, urgency, or hematuria.  No testicular pain or swelling.  PMFSH:  Past medical history, family history, social history, meds, and allergies were reviewed along with nurse's notes.  No prior abdominal surgeries or history of GI problems.  No use of NSAIDs or aspirin.  No excessive  alcohol intake. He is allergic to Diovan quinapril. He takes allopurinol, alprazolam, Exelon, Symbicort, Bystolic, DEXILANT, Lexapro, Allegra, malaise, and tramadol.  Physical Exam:   Vital signs:  BP 117/72  Pulse 70  Temp(Src) 99.5 F (37.5 C) (Oral)  Resp 16  SpO2 100% Gen:  Alert, oriented, in no distress. Lungs:  Breath sounds clear and equal bilaterally.  No wheezes, rales or rhonchi. Heart:  Regular rhythm.  No gallops or murmers.   Abdomen:  There is exquisite tenderness to palpation with guarding and rebound in the left lower quadrant of the abdomen. Otherwise abdomen was nontender. No organomegaly or mass.  Bowel sounds are normally active. Skin:  Clear, warm and dry.  No rash.  Labs:   Results for orders placed during the hospital encounter of 12/04/12  POCT URINALYSIS DIP (DEVICE)      Result Value Range   Glucose, UA NEGATIVE  NEGATIVE mg/dL   Bilirubin Urine NEGATIVE  NEGATIVE   Ketones, ur NEGATIVE  NEGATIVE mg/dL   Specific Gravity, Urine 1.015  1.005 - 1.030   Hgb urine dipstick NEGATIVE  NEGATIVE   pH 6.5  5.0 - 8.0   Protein, ur NEGATIVE  NEGATIVE mg/dL   Urobilinogen, UA 0.2  0.0 - 1.0 mg/dL   Nitrite NEGATIVE  NEGATIVE   Leukocytes, UA NEGATIVE  NEGATIVE    Course in Urgent Care Center:   Given Dilaudid 2 mg IM and Zofran ODT 8 mg sublingually.  Assessment:  The encounter diagnosis was Diverticulitis.  Plan:  The patient was transferred to the ED via shuttle in stable condition.  Medical Decision Making: 42 year old male has a history since 4:30 this morning of severe LLQ pain.  No fever, nausea, vomiting, diarrhea, hematochezia, or urinary Sx.  UA is negative.  On exam he has guarding in LLQ.  I believe he has diverticulitis and needs a CT scan and IV antibiotics.        Reuben Likes, MD 12/04/12 [phone]

## 2012-12-04 NOTE — ED Notes (Signed)
Pt  Reports  llq  Pain       X  2  Days  Ago  Started  As  Pressure     Now  It  Is   Pain     - the     Pain  Scale  Is  10         Nausea             No  Vomiting

## 2012-12-04 NOTE — ED Notes (Signed)
Pt transported to ct scan

## 2012-12-04 NOTE — ED Notes (Signed)
Pt sent here from [facility]. Reports onset this am of LLQ pain, reports feeling "pressure" there for several days. Denies any urinary symptoms or n/v/d. Sent here for ct scan and r/o diverticulitis.

## 2012-12-04 NOTE — ED Notes (Signed)
Advised   npo 

## 2012-12-04 NOTE — ED Provider Notes (Signed)
CSN: [id number]     Arrival date & time 12/04/12  1333 History   First MD Initiated Contact with Patient 12/04/12 1538     Chief Complaint  Patient presents with  . Abdominal Pain   (Consider location/radiation/quality/duration/timing/severity/associated sxs/prior Treatment) HPI  42 year old male with abdominal pain. Left lower quadrant. Gradual onset 2 days ago. Initially intermittent and described as a pressure sensation. Progressively worsening and now constant. Pain does not radiate. No urinary complaints. No diarrhea or constipation. No bright blood parietal or melena. No fevers or chills. No history similar complaints. Nausea, but no vomiting. No prior history of abdominal surgery. Patient was seen in urgent care just prior to arrival. He was referred to the emergency room for further evaluation of suspected diverticulitis.  Past Medical History  Diagnosis Date  . Asthma   . Hypertension     Prehypertension   Past Surgical History  Procedure Laterality Date  . Shoulder surgery      right x 2   Family History  Problem Relation Age of Onset  . Hyperlipidemia Mother   . Hypertension Mother   . GER disease Mother   . Cancer Father     [place]  . Diabetes Father   . Thyroid disease Father    History  Substance Use Topics  . Smoking status: Never Smoker   . Smokeless tobacco: Not on file  . Alcohol Use: Yes     Comment: rarely    Review of Systems  All systems reviewed and negative, other than as noted in HPI.   Allergies  Diovan and Quinapril hcl  Home Medications   Current Outpatient Rx  Name  Route  Sig  Dispense  Refill  . acetaminophen (TYLENOL) 325 MG tablet   Oral   Take 650 mg by mouth every 6 (six) hours as needed for pain.         Marland Kitchen albuterol (VENTOLIN HFA) 108 (90 BASE) MCG/ACT inhaler   Inhalation   Inhale 1 puff into the lungs every 6 (six) hours as needed for wheezing.   18 each   3   . ALPRAZolam (XANAX) 0.5 MG tablet   Oral   Take  0.5 mg by mouth 2 (two) times daily as needed for sleep or anxiety. Takes everyday at bedtime         . Ascorbic Acid (VITAMIN C) 1000 MG tablet   Oral   Take 1,000 mg by mouth 2 (two) times daily.         . budesonide-formoterol (SYMBICORT) 160-4.5 MCG/ACT inhaler   Inhalation   Inhale 2 puffs into the lungs daily as needed (seasonal allergies).          . Cholecalciferol (VITAMIN D3) 5000 UNITS CAPS   Oral   Take 5,000 Units by mouth daily.          Marland Kitchen dexlansoprazole (DEXILANT) 60 MG capsule   Oral   Take 60 mg by mouth daily.         Marland Kitchen escitalopram (LEXAPRO) 20 MG tablet      One half to one tablet daily as directed   30 tablet   5   . fexofenadine (ALLEGRA) 180 MG tablet   Oral   Take 180 mg by mouth daily as needed (allergies).          . Magnesium Oxide (MAG-OX PO)   Oral   Take 1 tablet by mouth daily.          . Melatonin 10 MG  TABS   Oral   Take 10 mg by mouth every evening.          . Multiple Vitamin (MULTIVITAMIN) tablet   Oral   Take 1 tablet by mouth daily.         . nebivolol (BYSTOLIC) 10 MG tablet   Oral   Take 10 mg by mouth daily.         Marland Kitchen omega-3 acid ethyl esters (LOVAZA) 1 G capsule   Oral   Take 2 g by mouth daily.         . Testosterone (AXIRON) 30 MG/ACT SOLN   Topical   Apply 2 application topically daily. To each arm         . traMADol (ULTRAM) 50 MG tablet   Oral   Take 50 mg by mouth every 6 (six) hours as needed for pain.           BP 126/71  Pulse 75  Temp(Src) 98.8 F (37.1 C) (Oral)  Resp 16  Wt 241 lb (109.317 kg)  SpO2 97% Physical Exam  Nursing note and vitals reviewed. Constitutional: He appears well-developed and well-nourished. No distress.  HENT:  Head: Normocephalic and atraumatic.  Eyes: Conjunctivae are normal. Right eye exhibits no discharge. Left eye exhibits no discharge.  Neck: Neck supple.  Cardiovascular: Normal rate, regular rhythm and normal heart sounds.  Exam reveals  no gallop and no friction rub.   No murmur heard. Pulmonary/Chest: Effort normal and breath sounds normal. No respiratory distress.  Abdominal: Soft. He exhibits no distension and no mass. There is tenderness. There is guarding. There is no rebound.  significant tenderness in left lower quadrant and to a lesser degree suprapubically. Voluntary guarding. No rebound tenderness no distention.  Genitourinary:  No CVA tenderness  Musculoskeletal: He exhibits no edema and no tenderness.  Neurological: He is alert.  Skin: Skin is warm and dry.  Psychiatric: He has a normal mood and affect. His behavior is normal. Thought content normal.    ED Course  Procedures (including critical care time) Labs Review Labs Reviewed  CBC WITH DIFFERENTIAL - Abnormal; Notable for the following:    WBC 10.9 (*)    Hemoglobin 17.1 (*)    MCHC 36.5 (*)    Platelets 130 (*)    Neutro Abs 8.3 (*)    All other components within normal limits  COMPREHENSIVE METABOLIC PANEL - Abnormal; Notable for the following:    ALT 73 (*)    GFR calc non Af Amer 88 (*)    All other components within normal limits  LIPASE, BLOOD  URINALYSIS, ROUTINE W REFLEX MICROSCOPIC   Imaging Review No results found.  CT Abdomen Pelvis W Contrast  12/04/2012   CLINICAL DATA:  Left lower quadrant pain.  EXAM: CT ABDOMEN AND PELVIS WITH CONTRAST  TECHNIQUE: Multidetector CT imaging of the abdomen and pelvis was performed using the standard protocol following bolus administration of intravenous contrast.  CONTRAST:  OMNIPAQUE IOHEXOL 300 MG/ML  SOLN  COMPARISON:  None.  FINDINGS: The liver, gallbladder, pancreas, spleen, adrenal glands, and kidneys are normal in appearance. No evidence of hydronephrosis. No soft tissue masses or lymphadenopathy identified within the abdomen or pelvis.  Mild diverticulitis is seen involving the proximal sigmoid colon. No evidence of abscess or free fluid. No evidence of bowel obstruction. Normal appendix  is visualized. Incidentally noted are bilateral L5 pars defects, without evidence of associated spondylolisthesis.  IMPRESSION: Mild sigmoid diverticulitis. No evidence of abscess  or other complication.  Bilateral L5 pars defects incidentally noted.   Electronically Signed   By: Myles Rosenthal M.D.   On: 12/04/2012 17:55   EKG Interpretation   None       MDM   1. Diverticulitis     42 year old male with left lower quadrant pain. Referred from urgent care for evaluation of possible diverticulitis. I am in agreement that this is very high on the differential. No hernia appreciated on exam. Atypical for her UTI/pyelonephritis, renal colic. Previous provider note, nursing note and recent labs were reviewed. Mild leukocytosis. Otherwise fairly unremarkable. UA clean. Will place IV and pursue CT. Continued symptomatic management and reassessment. If ultimately diverticulitis and no evidence of abscess or perforation, anticipate outpatient treatment.     Raeford Razor, MD 12/08/12 [phone]

## 2012-12-07 ENCOUNTER — Ambulatory Visit: Payer: PRIVATE HEALTH INSURANCE | Admitting: Family Medicine

## 2012-12-08 ENCOUNTER — Encounter: Payer: Self-pay | Admitting: Family Medicine

## 2012-12-08 ENCOUNTER — Ambulatory Visit (INDEPENDENT_AMBULATORY_CARE_PROVIDER_SITE_OTHER): Payer: PRIVATE HEALTH INSURANCE | Admitting: Family Medicine

## 2012-12-08 VITALS — BP 117/75 | HR 56 | Temp 98.4°F | Ht 69.0 in | Wt 252.0 lb

## 2012-12-08 DIAGNOSIS — Z8719 Personal history of other diseases of the digestive system: Secondary | ICD-10-CM

## 2012-12-08 DIAGNOSIS — K5732 Diverticulitis of large intestine without perforation or abscess without bleeding: Secondary | ICD-10-CM

## 2012-12-08 DIAGNOSIS — K5792 Diverticulitis of intestine, part unspecified, without perforation or abscess without bleeding: Secondary | ICD-10-CM

## 2012-12-08 LAB — POCT CBC
Granulocyte percent: 62.6 %G (ref 37–80)
HCT, POC: 48.3 % (ref 43.5–53.7)
Hemoglobin: 16.3 g/dL (ref 14.1–18.1)
Lymph, poc: 1.7 (ref 0.6–3.4)
MCH, POC: 29.8 pg (ref 27–31.2)
MCHC: 33.7 g/dL (ref 31.8–35.4)
MCV: 88.4 fL (ref 80–97)
MPV: 8.8 fL (ref 0–99.8)
POC Granulocyte: 3.6 (ref 2–6.9)
POC LYMPH PERCENT: 30 %L (ref 10–50)
Platelet Count, POC: 150 10*3/uL (ref 142–424)
RBC: 5.5 M/uL (ref 4.69–6.13)
RDW, POC: 11.4 %
WBC: 5.7 10*3/uL (ref 4.6–10.2)

## 2012-12-08 NOTE — Progress Notes (Signed)
Subjective:    Patient ID: Roy Lawrence, male    DOB: 1970/02/22, 42 y.o.   MRN: [id number]  HPI Patient here today for hospital follow up from [facility] on 12-04-12 for primary diagnosis of diverticulitis. Patient comes in today with his mom. This is a followup visit from the emergency room because of a bout with sigmoid diverticulitis. Patient was seen in the emergency room, received a CT scan and lab work and was diagnosed with mild diverticulitis of the proximal sigmoid colon. He was given IV Cipro and metronidazole and started on these medications by mouth. He comes in today he, feeling much better still somewhat tender in the left lower quadrant. He indicates he has no more nausea or vomiting and that he has had no blood in the stool or black tarry bowel movements. He is feeling much better. He was given a 7 day course of Cipro and metronidazole which will be completed over this coming weekend.     Patient Active Problem List   Diagnosis Date Noted  . Anxiety 11/09/2012  . History of asthma 11/09/2012  . Low back pain syndrome 11/09/2012  . Spondylolisthesis of lumbar region 11/09/2012  . Testosterone deficiency 05/09/2012  . Syncope 07/29/2011  . HTN (hypertension) 07/29/2011  . Overweight 07/29/2011  . ESOPHAGEAL STRICTURE 01/10/2008  . DYSPHAGIA 01/10/2008  . GERD 01/09/2008  . PEPTIC STRICTURE 01/09/2008   Outpatient Encounter Prescriptions as of 12/08/2012  Medication Sig  . acetaminophen (TYLENOL) 325 MG tablet Take 650 mg by mouth every 6 (six) hours as needed for pain.  Marland Kitchen albuterol (VENTOLIN HFA) 108 (90 BASE) MCG/ACT inhaler Inhale 1 puff into the lungs every 6 (six) hours as needed for wheezing.  Marland Kitchen ALPRAZolam (XANAX) 0.5 MG tablet Take 0.5 mg by mouth 2 (two) times daily as needed for sleep or anxiety. Takes everyday at bedtime  . Ascorbic Acid (VITAMIN C) 1000 MG tablet Take 1,000 mg by mouth 2 (two) times daily.  . budesonide-formoterol (SYMBICORT) 160-4.5 MCG/ACT inhaler  Inhale 2 puffs into the lungs daily as needed (seasonal allergies).   . Cholecalciferol (VITAMIN D3) 5000 UNITS CAPS Take 5,000 Units by mouth daily.   . ciprofloxacin (CIPRO) 500 MG tablet Take 1 tablet (500 mg total) by mouth every 12 (twelve) hours.  Marland Kitchen dexlansoprazole (DEXILANT) 60 MG capsule Take 60 mg by mouth daily.  Marland Kitchen escitalopram (LEXAPRO) 20 MG tablet One half to one tablet daily as directed  . fexofenadine (ALLEGRA) 180 MG tablet Take 180 mg by mouth daily as needed (allergies).   . Magnesium Oxide (MAG-OX PO) Take 1 tablet by mouth daily.   . Melatonin 10 MG TABS Take 10 mg by mouth every evening.   . metroNIDAZOLE (FLAGYL) 500 MG tablet Take 1 tablet (500 mg total) by mouth 3 (three) times daily.  . Multiple Vitamin (MULTIVITAMIN) tablet Take 1 tablet by mouth daily.  . nebivolol (BYSTOLIC) 10 MG tablet Take 10 mg by mouth daily.  Marland Kitchen omega-3 acid ethyl esters (LOVAZA) 1 G capsule Take 2 g by mouth daily.  Marland Kitchen oxyCODONE-acetaminophen (PERCOCET/ROXICET) 5-325 MG per tablet Take 1-2 tablets by mouth every 4 (four) hours as needed for pain.  . Testosterone (AXIRON) 30 MG/ACT SOLN Apply 2 application topically daily. To each arm  . traMADol (ULTRAM) 50 MG tablet Take 50 mg by mouth every 6 (six) hours as needed for pain.     Review of Systems  Constitutional: Negative.   HENT: Negative.   Eyes: Negative.   Respiratory:  Negative.   Cardiovascular: Negative.   Gastrointestinal: Negative.   Endocrine: Negative.   Genitourinary: Negative.   Musculoskeletal: Negative.   Skin: Negative.   Allergic/Immunologic: Negative.   Neurological: Negative.   Hematological: Negative.   Psychiatric/Behavioral: Negative.        Objective:   Physical Exam  Nursing note and vitals reviewed. Constitutional: He is oriented to person, place, and time. He appears well-developed and well-nourished. No distress.  HENT:  Head: Normocephalic.  Eyes: Right eye exhibits no discharge. Left eye exhibits  no discharge. No scleral icterus.  Neck: Normal range of motion.  Cardiovascular: Normal rate, regular rhythm and normal heart sounds.   No murmur heard. 72 per minute  Pulmonary/Chest: Effort normal and breath sounds normal. He has no wheezes. He has no rales.  Abdominal: Soft. Bowel sounds are normal. He exhibits no mass. There is tenderness (he has both epigastricand left lower quadrant tenderness). There is no rebound and no guarding.  Musculoskeletal: Normal range of motion.  Neurological: He is alert and oriented to person, place, and time.  Skin: Skin is warm and dry. No rash noted.  Psychiatric: He has a normal mood and affect. His behavior is normal. Judgment and thought content normal.   BP 117/75  Pulse 56  Temp(Src) 98.4 F (36.9 C) (Oral)  Ht 5\' 9"  (1.753 m)  Wt 252 lb (114.306 kg)  BMI 37.20 kg/m2        Assessment & Plan:   1. Diverticulitis   2. History of diverticulitis of colon    Orders Placed This Encounter  Procedures  . Ambulatory referral to Gastroenterology    Referral Priority:  Routine    Referral Type:  Consultation    Referral Reason:  Specialty Services Required    Requested Specialty:  Gastroenterology    Number of Visits Requested:  1  . POCT CBC   No orders of the defined types were placed in this encounter.   Patient Instructions  Continue current medications. Finish antibiotics Continue good therapeutic lifestyle changes.  Follow up as planned and earlier as needed.  We will work on appointment for you for followup with the gastroenterologist for a future colonoscopy Continue to watch your diet especially for peanuts popcorn seedy type foods    Nyra Capes MD

## 2012-12-08 NOTE — Patient Instructions (Addendum)
Continue current medications. Finish antibiotics Continue good therapeutic lifestyle changes.  Follow up as planned and earlier as needed.  We will work on appointment for you for followup with the gastroenterologist for a future colonoscopy Continue to watch your diet especially for peanuts popcorn seedy type foods

## 2012-12-27 ENCOUNTER — Encounter: Payer: Self-pay | Admitting: Internal Medicine

## 2012-12-28 ENCOUNTER — Other Ambulatory Visit: Payer: Self-pay | Admitting: Family Medicine

## 2013-01-06 ENCOUNTER — Ambulatory Visit: Payer: PRIVATE HEALTH INSURANCE | Admitting: Internal Medicine

## 2013-01-24 ENCOUNTER — Encounter: Payer: Self-pay | Admitting: Cardiology

## 2013-02-07 ENCOUNTER — Encounter: Payer: Self-pay | Admitting: Internal Medicine

## 2013-02-07 ENCOUNTER — Ambulatory Visit (INDEPENDENT_AMBULATORY_CARE_PROVIDER_SITE_OTHER): Payer: PRIVATE HEALTH INSURANCE | Admitting: Internal Medicine

## 2013-02-07 VITALS — BP 110/78 | HR 64 | Ht 69.0 in | Wt 256.0 lb

## 2013-02-07 DIAGNOSIS — K219 Gastro-esophageal reflux disease without esophagitis: Secondary | ICD-10-CM

## 2013-02-07 DIAGNOSIS — K5732 Diverticulitis of large intestine without perforation or abscess without bleeding: Secondary | ICD-10-CM

## 2013-02-07 DIAGNOSIS — R1032 Left lower quadrant pain: Secondary | ICD-10-CM

## 2013-02-07 DIAGNOSIS — K5792 Diverticulitis of intestine, part unspecified, without perforation or abscess without bleeding: Secondary | ICD-10-CM

## 2013-02-07 DIAGNOSIS — K222 Esophageal obstruction: Secondary | ICD-10-CM

## 2013-02-07 MED ORDER — MOVIPREP 100 G PO SOLR: 1.0000 | Freq: Once | ORAL | Status: DC

## 2013-02-07 NOTE — Patient Instructions (Signed)

## 2013-02-07 NOTE — Progress Notes (Signed)
HISTORY OF PRESENT ILLNESS:  Roy Lawrence is a 43 y.o. male with hypertension and, asthma, and GERD complicated by peptic stricture for which she has required esophageal dilation. He presents today, upon referral from his PCP, regarding recently diagnosed diverticulitis. The patient was last seen January 2010 when he underwent upper endoscopy with esophageal dilation to 18 mm. He has continued on Dexilant 60 mg daily. Occasional breakthrough reflux symptoms. Occasional mild dysphagia to meats and breads. His current history is that of left lower quadrant pain began around Halloween. The discomfort progress. On 12/04/2012 he was evaluated and underwent CT scan of the abdomen and pelvis. Uncomplicated sigmoid diverticulitis diagnosed. He was treated with ciprofloxacin and metronidazole, he believes for 2 weeks. Symptoms resolved without recurrence. Currently doing well. No change in bowel habits or bleeding. No family history of colon cancer. Mother with significant diverticular disease and required segmental resection of her colon.  REVIEW OF SYSTEMS:  All non-GI ROS negative   Past Medical History  Diagnosis Date  . Asthma   . Hypertension     Prehypertension  . Spondylosis   . Heart murmur   . GERD (gastroesophageal reflux disease)   . Esophageal stricture   . Diverticulitis   . Anxiety   . Depression   . Diverticulosis   . Obesity     Past Surgical History  Procedure Laterality Date  . Shoulder surgery Right     x 2  . Finger surgery Right     3rd digit; imbedded glass  . Esophageal dilation      Social History Roy Lawrence  reports that he has never smoked. He has never used smokeless tobacco. He reports that he drinks alcohol. He reports that he does not use illicit drugs.  family history includes Diabetes in his father; Diverticulitis in his mother; GER disease in his mother; Hodgkin's lymphoma in his father; Hyperlipidemia in his mother; Hypertension in his mother;  Thyroid disease in his father. There is no history of Colon cancer.  Allergies  Allergen Reactions  . Diovan [Valsartan] Swelling  . Quinapril Hcl Swelling       PHYSICAL EXAMINATION: Vital signs: BP 110/78  Pulse 64  Ht 5\' 9"  (1.753 m)  Wt 256 lb (116.121 kg)  BMI 37.79 kg/m2  Constitutional: generally well-appearing, no acute distress Psychiatric: alert and oriented x3, cooperative Eyes: extraocular movements intact, anicteric, conjunctiva pink Mouth: oral pharynx moist, no lesions Neck: supple no lymphadenopathy Cardiovascular: heart regular rate and rhythm, no murmur Lungs: clear to auscultation bilaterally Abdomen: soft, nontender, nondistended, no obvious ascites, no peritoneal signs, normal bowel sounds, no organomegaly Rectal: Deferred until colonoscopy Extremities: no lower extremity edema bilaterally Skin: no lesions on visible extremities Neuro: No focal deficits.   ASSESSMENT:  #1. Acute sigmoid diverticulitis. Resolved #2. GERD complicated by peptic stricture. Some breakthrough symptoms and mild dysphagia. He was offered dilation, but does not feel it is necessary at this time   PLAN:  #1. Continue PPI #2. Continue reflux precautions #3. Repeat esophageal dilation if dysphagia worsens #4. Colonoscopy to rule out other entities masquerading as diverticulitis. As well, provide colorectal neoplasia screening in this 43 year old

## 2013-02-20 ENCOUNTER — Encounter: Payer: Self-pay | Admitting: Internal Medicine

## 2013-02-20 ENCOUNTER — Ambulatory Visit (AMBULATORY_SURGERY_CENTER): Payer: PRIVATE HEALTH INSURANCE | Admitting: Internal Medicine

## 2013-02-20 VITALS — BP 122/68 | HR 61 | Temp 96.1°F | Resp 18 | Ht 69.0 in | Wt 256.0 lb

## 2013-02-20 DIAGNOSIS — K5792 Diverticulitis of intestine, part unspecified, without perforation or abscess without bleeding: Secondary | ICD-10-CM

## 2013-02-20 DIAGNOSIS — K5730 Diverticulosis of large intestine without perforation or abscess without bleeding: Secondary | ICD-10-CM

## 2013-02-20 DIAGNOSIS — K5732 Diverticulitis of large intestine without perforation or abscess without bleeding: Secondary | ICD-10-CM

## 2013-02-20 DIAGNOSIS — R1032 Left lower quadrant pain: Secondary | ICD-10-CM

## 2013-02-20 DIAGNOSIS — R933 Abnormal findings on diagnostic imaging of other parts of digestive tract: Secondary | ICD-10-CM

## 2013-02-20 MED ORDER — SODIUM CHLORIDE 0.9 % IV SOLN: 500.0000 mL | INTRAVENOUS | Status: DC

## 2013-02-20 NOTE — Progress Notes (Signed)
Report to pacu rn, vss, bbs=clear 

## 2013-02-20 NOTE — Op Note (Signed)
[facility]  [address] [place], [id number]   COLONOSCOPY PROCEDURE REPORT  PATIENT: Roy Lawrence, Roy Lawrence  MR#: [id number] BIRTHDATE: [birth year] , 42  yrs. old GENDER: Male ENDOSCOPIST: Eustace Quail, MD REFERRED BY:.  Self / Office PROCEDURE DATE:  02/20/2013 PROCEDURE:   Colonoscopy, diagnostic First Screening Colonoscopy - Avg.  risk and is 50 yrs.  old or older - No.  Prior Negative Screening - Now for repeat screening. N/A  History of Adenoma - Now for follow-up colonoscopy & has been > or = to 3 yrs.  N/A  Polyps Removed Today? No.  Recommend repeat exam, <10 yrs? No. ASA CLASS:   Class II INDICATIONS:an abnormal CT and abdominal pain in the lower left quadrant.   Dx and treated for diverticulitis recently MEDICATIONS: MAC sedation, administered by CRNA and propofol (Diprivan) 300mg  IV  DESCRIPTION OF PROCEDURE:   After the risks benefits and alternatives of the procedure were thoroughly explained, informed consent was obtained.  A digital rectal exam revealed no abnormalities of the rectum.   The LB [id number]  endoscope was introduced through the anus and advanced to the cecum, which was identified by both the appendix and ileocecal valve. No adverse events experienced.   The quality of the prep was excellent, using MoviPrep  The instrument was then slowly withdrawn as the colon was fully examined.   COLON FINDINGS: Moderate diverticulosis was noted throughout the entire examined colon.   The colon mucosa was otherwise normal. Retroflexed views revealed internal hemorrhoids. The time to cecum=2 minutes 42 seconds.  Withdrawal time=9 minutes 38 seconds. The scope was withdrawn and the procedure completed. COMPLICATIONS: There were no complications.  ENDOSCOPIC IMPRESSION: 1.   Moderate diverticulosis was noted throughout the entire examined colon 2.   The colon mucosa was otherwise normal  RECOMMENDATIONS: 1. Continue current colorectal  screening recommendations for "routine risk" patients with a repeat colonoscopy in 10 years.   eSigned:  Eustace Quail, MD 02/20/2013 8:57 AM   cc: Redge Gainer, MD and The Patient

## 2013-02-20 NOTE — Patient Instructions (Signed)
YOU HAD AN ENDOSCOPIC PROCEDURE TODAY AT THE [facility]: Refer to the procedure report that was given to you for any specific questions about what was found during the examination.  If the procedure report does not answer your questions, please call your gastroenterologist to clarify.  If you requested that your care partner not be given the details of your procedure findings, then the procedure report has been included in a sealed envelope for you to review at your convenience later.  YOU SHOULD EXPECT: Some feelings of bloating in the abdomen. Passage of more gas than usual.  Walking can help get rid of the air that was put into your GI tract during the procedure and reduce the bloating. If you had a lower endoscopy (such as a colonoscopy or flexible sigmoidoscopy) you may notice spotting of blood in your stool or on the toilet paper. If you underwent a bowel prep for your procedure, then you may not have a normal bowel movement for a few days.  DIET: Your first meal following the procedure should be a light meal and then it is ok to progress to your normal diet.  A half-sandwich or bowl of soup is an example of a good first meal.  Heavy or fried foods are harder to digest and may make you feel nauseous or bloated.  Likewise meals heavy in dairy and vegetables can cause extra gas to form and this can also increase the bloating.  Drink plenty of fluids but you should avoid alcoholic beverages for 24 hours.  ACTIVITY: Your care partner should take you home directly after the procedure.  You should plan to take it easy, moving slowly for the rest of the day.  You can resume normal activity the day after the procedure however you should NOT DRIVE or use heavy machinery for 24 hours (because of the sedation medicines used during the test).    SYMPTOMS TO REPORT IMMEDIATELY: A gastroenterologist can be reached at any hour.  During normal business hours, 8:30 AM to 5:00 PM Monday through Friday,  call [phone].  After hours and on weekends, please call the GI answering service at [phone] who will take a message and have the physician on call contact you.   Following lower endoscopy (colonoscopy or flexible sigmoidoscopy):  Excessive amounts of blood in the stool  Significant tenderness or worsening of abdominal pains  Swelling of the abdomen that is new, acute  Fever of 100F or higher    FOLLOW UP: If any biopsies were taken you will be contacted by phone or by letter within the next 1-3 weeks.  Call your gastroenterologist if you have not heard about the biopsies in 3 weeks.  Our staff will call the home number listed on your records the next business day following your procedure to check on you and address any questions or concerns that you may have at that time regarding the information given to you following your procedure. This is a courtesy call and so if there is no answer at the home number and we have not heard from you through the emergency physician on call, we will assume that you have returned to your regular daily activities without incident.  SIGNATURES/CONFIDENTIALITY: You and/or your care partner have signed paperwork which will be entered into your electronic medical record.  These signatures attest to the fact that that the information above on your After Visit Summary has been reviewed and is understood.  Full responsibility of the confidentiality   this discharge information lies with you and/or your care-partner.   INFORMATION ON DIVERTICULOSIS GIVEN TO YOU TODAY 

## 2013-02-21 ENCOUNTER — Telehealth: Payer: Self-pay | Admitting: *Deleted

## 2013-02-21 NOTE — Telephone Encounter (Signed)
  Follow up Call-  Call back number 02/20/2013  Post procedure Call Back phone  # [phone]  Permission to leave phone message Yes     Patient questions:  Do you have a fever, pain , or abdominal swelling? no Pain Score  0 *  Have you tolerated food without any problems? yes  Have you been able to return to your normal activities? yes  Do you have any questions about your discharge instructions: Diet   no Medications  no Follow up visit  no  Do you have questions or concerns about your Care? no  Actions: * If pain score is 4 or above: No action needed, pain <4.

## 2013-03-06 ENCOUNTER — Other Ambulatory Visit: Payer: Self-pay | Admitting: Family Medicine

## 2013-03-15 ENCOUNTER — Telehealth: Payer: Self-pay | Admitting: *Deleted

## 2013-03-15 NOTE — Telephone Encounter (Signed)
Call in a prescription for10 milligrams and have him take 2 day enough for one month with refills x6

## 2013-03-15 NOTE — Telephone Encounter (Signed)
Received fax from [facility] about bystolic. He currently takes 20mg  but pharmacy is no longer able to get. Do you want to change dose? Please advise

## 2013-03-17 ENCOUNTER — Other Ambulatory Visit: Payer: Self-pay | Admitting: *Deleted

## 2013-03-17 MED ORDER — NEBIVOLOL HCL 10 MG PO TABS: ORAL_TABLET | ORAL | Status: DC

## 2013-03-17 NOTE — Telephone Encounter (Signed)
Sent rx in to [facility]

## 2013-04-03 ENCOUNTER — Encounter: Payer: Self-pay | Admitting: *Deleted

## 2013-04-24 ENCOUNTER — Other Ambulatory Visit: Payer: Self-pay | Admitting: Family Medicine

## 2013-04-27 ENCOUNTER — Other Ambulatory Visit: Payer: Self-pay | Admitting: Family Medicine

## 2013-05-05 ENCOUNTER — Other Ambulatory Visit: Payer: Self-pay | Admitting: Family Medicine

## 2013-05-09 NOTE — Telephone Encounter (Signed)
Patient last seen in office on 12-08-12. Rx last filled on 03-27-13 for #[phone]. Please advise. If approved please route to Pool A so nurse can phone in to pharmacy

## 2013-05-09 NOTE — Telephone Encounter (Signed)
Please call in xanax with 1 refills 

## 2013-05-09 NOTE — Telephone Encounter (Signed)
Left authorization on pharmacy voicemail. 

## 2013-05-14 ENCOUNTER — Other Ambulatory Visit: Payer: Self-pay | Admitting: Family Medicine

## 2013-05-22 ENCOUNTER — Ambulatory Visit: Payer: PRIVATE HEALTH INSURANCE | Admitting: Family Medicine

## 2013-05-24 ENCOUNTER — Ambulatory Visit: Payer: PRIVATE HEALTH INSURANCE | Admitting: Family Medicine

## 2013-06-14 ENCOUNTER — Other Ambulatory Visit: Payer: Self-pay | Admitting: Family Medicine

## 2013-06-15 NOTE — Telephone Encounter (Signed)
Last lipid 11/09/12 [facility]

## 2013-06-16 ENCOUNTER — Other Ambulatory Visit: Payer: Self-pay | Admitting: Family Medicine

## 2013-06-19 ENCOUNTER — Ambulatory Visit (INDEPENDENT_AMBULATORY_CARE_PROVIDER_SITE_OTHER): Payer: PRIVATE HEALTH INSURANCE | Admitting: Family Medicine

## 2013-06-19 ENCOUNTER — Encounter: Payer: Self-pay | Admitting: Family Medicine

## 2013-06-19 VITALS — BP 115/70 | HR 64 | Temp 97.7°F | Ht 69.0 in | Wt 248.0 lb

## 2013-06-19 DIAGNOSIS — E785 Hyperlipidemia, unspecified: Secondary | ICD-10-CM

## 2013-06-19 DIAGNOSIS — M5010 Cervical disc disorder with radiculopathy, unspecified cervical region: Secondary | ICD-10-CM | POA: Insufficient documentation

## 2013-06-19 DIAGNOSIS — M5412 Radiculopathy, cervical region: Secondary | ICD-10-CM

## 2013-06-19 DIAGNOSIS — I10 Essential (primary) hypertension: Secondary | ICD-10-CM

## 2013-06-19 DIAGNOSIS — E291 Testicular hypofunction: Secondary | ICD-10-CM

## 2013-06-19 DIAGNOSIS — K219 Gastro-esophageal reflux disease without esophagitis: Secondary | ICD-10-CM

## 2013-06-19 DIAGNOSIS — R51 Headache: Secondary | ICD-10-CM

## 2013-06-19 DIAGNOSIS — E559 Vitamin D deficiency, unspecified: Secondary | ICD-10-CM

## 2013-06-19 DIAGNOSIS — E349 Endocrine disorder, unspecified: Secondary | ICD-10-CM

## 2013-06-19 LAB — POCT URINALYSIS DIPSTICK
Bilirubin, UA: NEGATIVE
Blood, UA: NEGATIVE
Glucose, UA: NEGATIVE
Ketones, UA: NEGATIVE
Leukocytes, UA: NEGATIVE
Nitrite, UA: NEGATIVE
Protein, UA: NEGATIVE
Spec Grav, UA: 1.01
Urobilinogen, UA: NEGATIVE
pH, UA: 6.5

## 2013-06-19 LAB — POCT UA - MICROSCOPIC ONLY
Casts, Ur, LPF, POC: NEGATIVE
Crystals, Ur, HPF, POC: NEGATIVE
Epithelial cells, urine per micros: NEGATIVE
Mucus, UA: NEGATIVE
RBC, urine, microscopic: NEGATIVE
WBC, Ur, HPF, POC: NEGATIVE
Yeast, UA: NEGATIVE

## 2013-06-19 LAB — POCT CBC
Granulocyte percent: 62 %G (ref 37–80)
HCT, POC: 51.1 % (ref 43.5–53.7)
Hemoglobin: 16.4 g/dL (ref 14.1–18.1)
Lymph, poc: 2.2 (ref 0.6–3.4)
MCH, POC: 27.8 pg (ref 27–31.2)
MCHC: 32 g/dL (ref 31.8–35.4)
MCV: 86.9 fL (ref 80–97)
MPV: 9.4 fL (ref 0–99.8)
POC Granulocyte: 4 (ref 2–6.9)
POC LYMPH PERCENT: 33.7 %L (ref 10–50)
Platelet Count, POC: 132 10*3/uL — AB (ref 142–424)
RBC: 5.9 M/uL (ref 4.69–6.13)
RDW, POC: 13.2 %
WBC: 6.4 10*3/uL (ref 4.6–10.2)

## 2013-06-19 MED ORDER — TESTOSTERONE 30 MG/ACT TD SOLN: 2.0000 "application " | Freq: Every day | TRANSDERMAL | Status: DC

## 2013-06-19 MED ORDER — ESCITALOPRAM OXALATE 20 MG PO TABS: ORAL_TABLET | ORAL | Status: DC

## 2013-06-19 MED ORDER — ALPRAZOLAM 0.5 MG PO TABS: ORAL_TABLET | ORAL | Status: DC

## 2013-06-19 MED ORDER — NEBIVOLOL HCL 10 MG PO TABS: 10.0000 mg | ORAL_TABLET | Freq: Two times a day (BID) | ORAL | Status: DC

## 2013-06-19 MED ORDER — DEXLANSOPRAZOLE 60 MG PO CPDR: DELAYED_RELEASE_CAPSULE | ORAL | Status: DC

## 2013-06-19 NOTE — Patient Instructions (Addendum)
Continue current medications. Continue good therapeutic lifestyle changes which include good diet and exercise. Fall precautions discussed with patient. If an FOBT was given today- please return it to our front desk. If you are over 45 years old - you may need Prevnar 54 or the adult Pneumonia vaccine.  Please get a copy of your recent lab work and have that sent to our office We will try to obtain the x-rays of your neck that were done at an outside hospital. If you keep having these episodes of sharp headaches and numbness on the face we may need to get an MRI Continue her current medication return to clinic in 6 month

## 2013-06-19 NOTE — Progress Notes (Addendum)
Subjective:    Patient ID: Roy Lawrence, male    DOB: [birth year], 43 y.o.   MRN: [id number]  HPI Pt here for follow up and management of chronic medical problems. The patient complains of some numbness on the left side of his face and a sharp pain on the right parietal scalp, this has occurred on 2 occasions over the past month. PSA was done at [facility] and was 0.9- this is the same as 7 mos prior.        Patient Active Problem List   Diagnosis Date Noted  . History of diverticulitis of colon 12/08/2012  . Anxiety 11/09/2012  . History of asthma 11/09/2012  . Low back pain syndrome 11/09/2012  . Spondylolisthesis of lumbar region 11/09/2012  . Testosterone deficiency 05/09/2012  . Syncope 07/29/2011  . HTN (hypertension) 07/29/2011  . Overweight 07/29/2011  . ESOPHAGEAL STRICTURE 01/10/2008  . DYSPHAGIA 01/10/2008  . GERD 01/09/2008  . PEPTIC STRICTURE 01/09/2008   Outpatient Encounter Prescriptions as of 06/19/2013  Medication Sig  . acetaminophen (TYLENOL) 325 MG tablet Take 650 mg by mouth every 6 (six) hours as needed for pain.  Marland Kitchen albuterol (VENTOLIN HFA) 108 (90 BASE) MCG/ACT inhaler Inhale 1 puff into the lungs every 6 (six) hours as needed for wheezing.  Marland Kitchen ALPRAZolam (XANAX) 0.5 MG tablet TAKE ONE TABLET BY MOUTH TWICE DAILY AS NEEDED  . Ascorbic Acid (VITAMIN C) 1000 MG tablet Take 1,000 mg by mouth 2 (two) times daily.  Marland Kitchen BYSTOLIC 20 MG TABS TAKE ONE TABLET BY MOUTH ONCE DAILY  . Cholecalciferol (VITAMIN D3) 5000 UNITS CAPS Take 5,000 Units by mouth daily.   Marland Kitchen DEXILANT 60 MG capsule TAKE ONE  BY MOUTH ONCE DAILY  . escitalopram (LEXAPRO) 20 MG tablet TAKE ONE-HALF TO ONE TABLET BY MOUTH ONCE DAILY  . fexofenadine (ALLEGRA) 180 MG tablet Take 180 mg by mouth daily as needed (allergies).   . Magnesium Oxide (MAG-OX PO) Take 1 tablet by mouth daily.   . Melatonin 10 MG TABS Take 10 mg by mouth every evening.   . Multiple Vitamin (MULTIVITAMIN) tablet Take 1  tablet by mouth daily.  . nebivolol (BYSTOLIC) 10 MG tablet Take two tabs every day  . omega-3 acid ethyl esters (LOVAZA) 1 G capsule TAKE ONE CAPSULE BY MOUTH TWICE DAILY  . oxyCODONE-acetaminophen (PERCOCET/ROXICET) 5-325 MG per tablet Take 1-2 tablets by mouth every 4 (four) hours as needed for pain.  . SYMBICORT 160-4.5 MCG/ACT inhaler INHALE TWO PUFFS BY MOUTH TWICE DAILY. RINSE MOUTH AFTER USING.  . Testosterone (AXIRON) 30 MG/ACT SOLN Apply 2 application topically daily. To each arm  . traMADol (ULTRAM) 50 MG tablet Take 50 mg by mouth every 6 (six) hours as needed for pain.   . [DISCONTINUED] nebivolol (BYSTOLIC) 10 MG tablet Take 10 mg by mouth daily.    Review of Systems  Constitutional: Negative.   Eyes: Negative.   Respiratory: Negative.   Cardiovascular: Negative.        ?reduced circulation in legs, feet stay cold  Gastrointestinal: Negative.   Endocrine: Negative.   Genitourinary: Negative.   Musculoskeletal: Negative.   Skin: Negative.   Allergic/Immunologic: Negative.   Neurological: Positive for headaches (sharp headaches and some numbness on face- this has happened twice.).  Hematological: Negative.   Psychiatric/Behavioral: Negative.        Objective:   Physical Exam  Nursing note and vitals reviewed. Constitutional: He is oriented to person, place, and time. He appears well-developed  and well-nourished. No distress.  HENT:  Head: Normocephalic and atraumatic.  Right Ear: External ear normal.  Left Ear: External ear normal.  Mouth/Throat: Oropharynx is clear and moist. No oropharyngeal exudate.  Nasal congestion bilaterally  Eyes: Conjunctivae and EOM are normal. Pupils are equal, round, and reactive to light. Right eye exhibits no discharge. Left eye exhibits no discharge. No scleral icterus.  Neck: Normal range of motion. Neck supple. No thyromegaly present.  No carotid bruit  Cardiovascular: Normal rate, regular rhythm, normal heart sounds and intact  distal pulses.  Exam reveals no gallop and no friction rub.   No murmur heard. At 72 per minute  Pulmonary/Chest: Effort normal and breath sounds normal. No respiratory distress. He has no wheezes. He has no rales. He exhibits no tenderness.  No wheezes  Abdominal: Soft. Bowel sounds are normal. He exhibits no mass. There is tenderness. There is no rebound and no guarding.  There was some slight epigastric tenderness.  Genitourinary: Rectum normal, prostate normal and penis normal.  The rectal exam was normal. The prostate was not enlarged. There were no lumps or masses. There were no rectal masses. The external genitalia were within normal limits. There is no inguinal hernia or inguinal nodes.  Musculoskeletal: Normal range of motion. He exhibits no edema and no tenderness.  Lymphadenopathy:    He has no cervical adenopathy.  Neurological: He is alert and oriented to person, place, and time. He has normal reflexes. No cranial nerve deficit.  Skin: Skin is warm and dry. No rash noted. No erythema. No pallor.  Psychiatric: He has a normal mood and affect. His behavior is normal. Judgment and thought content normal.   BP 115/70  Pulse 64  Temp(Src) 97.7 F (36.5 C) (Oral)  Ht 5' 9"  (1.753 m)  Wt 248 lb (112.492 kg)  BMI 36.61 kg/m2        Assessment & Plan:   1. Testosterone deficiency - POCT UA - Microscopic Only - POCT urinalysis dipstick - POCT CBC - Testosterone,Free and Total  2. HTN (hypertension) - POCT CBC - BMP8+EGFR - Hepatic function panel - NMR, lipoprofile  3. GERD - POCT CBC - NMR, lipoprofile  4. Vitamin D deficiency - Vit D  25 hydroxy (rtn osteoporosis monitoring)  5. Headache(784.0)  6. Cervical disc disorder with radiculopathy of cervical region  7. Hyperlipidemia  Meds ordered this encounter  Medications  . DISCONTD: nebivolol (BYSTOLIC) 10 MG tablet    Sig: Take 10 mg by mouth daily.  . Testosterone (AXIRON) 30 MG/ACT SOLN    Sig: Apply 2  application topically daily. To each arm    Dispense:  180 mL    Refill:  5  . ALPRAZolam (XANAX) 0.5 MG tablet    Sig: TAKE ONE TABLET BY MOUTH TWICE DAILY AS NEEDED    Dispense:  60 tablet    Refill:  2  . dexlansoprazole (DEXILANT) 60 MG capsule    Sig: TAKE ONE  BY MOUTH ONCE DAILY    Dispense:  30 capsule    Refill:  6  . nebivolol (BYSTOLIC) 10 MG tablet    Sig: Take 1 tablet (10 mg total) by mouth 2 (two) times daily.    Dispense:  180 tablet    Refill:  1  . escitalopram (LEXAPRO) 20 MG tablet    Sig: TAKE ONE-HALF TO ONE TABLET BY MOUTH ONCE DAILY    Dispense:  90 tablet    Refill:  1   Patient Instructions  Continue current medications. Continue good therapeutic lifestyle changes which include good diet and exercise. Fall precautions discussed with patient. If an FOBT was given today- please return it to our front desk. If you are over 89 years old - you may need Prevnar 37 or the adult Pneumonia vaccine.  Please get a copy of your recent lab work and have that sent to our office We will try to obtain the x-rays of your neck that were done at an outside hospital. If you keep having these episodes of sharp headaches and numbness on the face we may need to get an MRI Continue her current medication return to clinic in 6 month   Arrie Senate MD

## 2013-06-19 NOTE — Addendum Note (Signed)
Addended by: Zannie Cove on: 06/19/2013 04:21 PM   Modules accepted: Orders

## 2013-06-20 LAB — NMR, LIPOPROFILE
Cholesterol: 160 mg/dL (ref 100–199)
HDL Cholesterol by NMR: 37 mg/dL — ABNORMAL LOW (ref >39)
HDL Particle Number: 29 umol/L — ABNORMAL LOW (ref >=30.5)
LDL Particle Number: 1119 nmol/L — ABNORMAL HIGH (ref <1000)
LDL Size: 20.9 nm (ref >20.5)
LDLC SERPL CALC-MCNC: 95 mg/dL (ref 0–99)
LP-IR Score: 74 — ABNORMAL HIGH (ref <=45)
Small LDL Particle Number: 149 nmol/L (ref <=527)
Triglycerides by NMR: 139 mg/dL (ref 0–149)

## 2013-06-20 LAB — HEPATIC FUNCTION PANEL
ALT: 52 IU/L — ABNORMAL HIGH (ref 0–44)
AST: 40 IU/L (ref 0–40)
Albumin: 5 g/dL (ref 3.5–5.5)
Alkaline Phosphatase: 61 IU/L (ref 39–117)
Bilirubin, Direct: 0.15 mg/dL (ref 0.00–0.40)
Total Bilirubin: 0.5 mg/dL (ref 0.0–1.2)
Total Protein: 6.7 g/dL (ref 6.0–8.5)

## 2013-06-20 LAB — BMP8+EGFR
BUN/Creatinine Ratio: 14 (ref 9–20)
BUN: 17 mg/dL (ref 6–24)
CO2: 19 mmol/L (ref 18–29)
Calcium: 10.1 mg/dL (ref 8.7–10.2)
Chloride: 102 mmol/L (ref 97–108)
Creatinine, Ser: 1.25 mg/dL (ref 0.76–1.27)
GFR calc Af Amer: 80 mL/min/{1.73_m2} (ref >59)
GFR calc non Af Amer: 69 mL/min/{1.73_m2} (ref >59)
Glucose: 96 mg/dL (ref 65–99)
Potassium: 4.7 mmol/L (ref 3.5–5.2)
Sodium: 144 mmol/L (ref 134–144)

## 2013-06-20 LAB — TESTOSTERONE,FREE AND TOTAL
Testosterone, Free: 18.9 pg/mL (ref 6.8–21.5)
Testosterone: 600 ng/dL (ref 348–1197)

## 2013-06-20 LAB — PSA, TOTAL AND FREE
PSA, Free Pct: 52.5 %
PSA, Free: 0.42 ng/mL
PSA: 0.8 ng/mL (ref 0.0–4.0)

## 2013-06-20 LAB — VITAMIN D 25 HYDROXY (VIT D DEFICIENCY, FRACTURES): Vit D, 25-Hydroxy: 35.4 ng/mL (ref 30.0–100.0)

## 2013-06-29 ENCOUNTER — Other Ambulatory Visit (INDEPENDENT_AMBULATORY_CARE_PROVIDER_SITE_OTHER): Payer: PRIVATE HEALTH INSURANCE

## 2013-06-29 DIAGNOSIS — M5412 Radiculopathy, cervical region: Secondary | ICD-10-CM

## 2013-06-29 DIAGNOSIS — R51 Headache: Secondary | ICD-10-CM

## 2013-06-29 DIAGNOSIS — M5010 Cervical disc disorder with radiculopathy, unspecified cervical region: Secondary | ICD-10-CM

## 2013-06-30 ENCOUNTER — Telehealth: Payer: Self-pay

## 2013-06-30 NOTE — Telephone Encounter (Signed)
[facility] on home answering machine

## 2013-06-30 NOTE — Telephone Encounter (Signed)
Message copied by Koren Bound on Fri Jun 30, 2013 11:00 AM ------      Message from: Chipper Herb      Created: Thu Jun 29, 2013 12:52 PM       As per radiology report ------

## 2013-07-03 NOTE — Telephone Encounter (Signed)
DR Laurance Flatten - he is still having facial numbness- is there anything else you suggest???

## 2013-07-03 NOTE — Telephone Encounter (Signed)
Please arrange for an appointment for this patient with the neurologist to see if maybe they want to get an MRI to further evaluate the facial numbness

## 2013-07-04 ENCOUNTER — Telehealth: Payer: Self-pay | Admitting: Family Medicine

## 2013-07-04 NOTE — Telephone Encounter (Signed)
Dr Jannifer Franklin per Laurance Flatten

## 2013-07-04 NOTE — Telephone Encounter (Signed)
Neurologist referral made  Pt not aware at this time- lmtcb jhb

## 2013-07-04 NOTE — Telephone Encounter (Signed)
Will call pt on original encounter

## 2013-07-04 NOTE — Telephone Encounter (Signed)
Pt aware.

## 2013-07-14 ENCOUNTER — Other Ambulatory Visit: Payer: Self-pay | Admitting: Family Medicine

## 2013-10-17 ENCOUNTER — Other Ambulatory Visit: Payer: Self-pay | Admitting: Family Medicine

## 2013-10-17 NOTE — Telephone Encounter (Signed)
Last seen 06/19/13, last filled 09/14/13. Route to pool A.Nurse call into [facility] [phone]

## 2013-10-26 ENCOUNTER — Other Ambulatory Visit: Payer: Self-pay | Admitting: Family Medicine

## 2013-10-27 NOTE — Telephone Encounter (Signed)
Called in.

## 2013-12-18 ENCOUNTER — Ambulatory Visit: Payer: PRIVATE HEALTH INSURANCE | Admitting: Family Medicine

## 2014-01-29 ENCOUNTER — Other Ambulatory Visit: Payer: Self-pay | Admitting: Family Medicine

## 2014-01-30 NOTE — Telephone Encounter (Signed)
Please advise on refill.  Last seen 06/19/13, no follow up scheduled.

## 2014-02-26 ENCOUNTER — Other Ambulatory Visit: Payer: Self-pay | Admitting: Family Medicine

## 2014-03-02 ENCOUNTER — Ambulatory Visit (INDEPENDENT_AMBULATORY_CARE_PROVIDER_SITE_OTHER): Payer: PRIVATE HEALTH INSURANCE | Admitting: Family Medicine

## 2014-03-02 ENCOUNTER — Encounter: Payer: Self-pay | Admitting: Family Medicine

## 2014-03-02 VITALS — BP 102/60 | HR 65 | Temp 97.0°F | Ht 69.0 in | Wt 258.0 lb

## 2014-03-02 DIAGNOSIS — N400 Enlarged prostate without lower urinary tract symptoms: Secondary | ICD-10-CM

## 2014-03-02 DIAGNOSIS — K219 Gastro-esophageal reflux disease without esophagitis: Secondary | ICD-10-CM

## 2014-03-02 DIAGNOSIS — R0683 Snoring: Secondary | ICD-10-CM

## 2014-03-02 DIAGNOSIS — E785 Hyperlipidemia, unspecified: Secondary | ICD-10-CM

## 2014-03-02 DIAGNOSIS — E291 Testicular hypofunction: Secondary | ICD-10-CM

## 2014-03-02 DIAGNOSIS — I10 Essential (primary) hypertension: Secondary | ICD-10-CM

## 2014-03-02 DIAGNOSIS — R5383 Other fatigue: Secondary | ICD-10-CM

## 2014-03-02 DIAGNOSIS — E559 Vitamin D deficiency, unspecified: Secondary | ICD-10-CM

## 2014-03-02 DIAGNOSIS — E349 Endocrine disorder, unspecified: Secondary | ICD-10-CM

## 2014-03-02 DIAGNOSIS — M25562 Pain in left knee: Secondary | ICD-10-CM

## 2014-03-02 DIAGNOSIS — R079 Chest pain, unspecified: Secondary | ICD-10-CM

## 2014-03-02 LAB — POCT UA - MICROSCOPIC ONLY
Bacteria, U Microscopic: NEGATIVE
Casts, Ur, LPF, POC: NEGATIVE
Crystals, Ur, HPF, POC: NEGATIVE
Mucus, UA: NEGATIVE
RBC, urine, microscopic: NEGATIVE
WBC, Ur, HPF, POC: NEGATIVE
Yeast, UA: NEGATIVE

## 2014-03-02 LAB — POCT URINALYSIS DIPSTICK
Bilirubin, UA: NEGATIVE
Blood, UA: NEGATIVE
Glucose, UA: NEGATIVE
Ketones, UA: NEGATIVE
Leukocytes, UA: NEGATIVE
Nitrite, UA: NEGATIVE
Protein, UA: NEGATIVE
Spec Grav, UA: 1.015
Urobilinogen, UA: NEGATIVE
pH, UA: 7

## 2014-03-02 MED ORDER — ALBUTEROL SULFATE HFA 108 (90 BASE) MCG/ACT IN AERS: 1.0000 | INHALATION_SPRAY | Freq: Four times a day (QID) | RESPIRATORY_TRACT | Status: DC | PRN

## 2014-03-02 MED ORDER — ALPRAZOLAM 0.5 MG PO TABS: 0.5000 mg | ORAL_TABLET | Freq: Two times a day (BID) | ORAL | Status: DC | PRN

## 2014-03-02 MED ORDER — OMEGA-3-ACID ETHYL ESTERS 1 G PO CAPS: 1.0000 | ORAL_CAPSULE | Freq: Two times a day (BID) | ORAL | Status: DC

## 2014-03-02 MED ORDER — BUDESONIDE-FORMOTEROL FUMARATE 160-4.5 MCG/ACT IN AERO: INHALATION_SPRAY | RESPIRATORY_TRACT | Status: DC

## 2014-03-02 MED ORDER — TESTOSTERONE 30 MG/ACT TD SOLN: 2.0000 "application " | Freq: Every day | TRANSDERMAL | Status: DC

## 2014-03-02 MED ORDER — DEXLANSOPRAZOLE 60 MG PO CPDR: DELAYED_RELEASE_CAPSULE | ORAL | Status: DC

## 2014-03-02 MED ORDER — NEBIVOLOL HCL 10 MG PO TABS: 10.0000 mg | ORAL_TABLET | Freq: Two times a day (BID) | ORAL | Status: DC

## 2014-03-02 NOTE — Progress Notes (Signed)
Subjective:    Patient ID: Roy Lawrence, male    DOB: 04/22/1970, 44 y.o.   MRN: [id number]  HPI Pt here for follow up and management of chronic medical problems which include hypertension, hyperlipidemia, and testosterone def. He is taking medications regularly. The patient still has shifting hours at work and it is hard to adjust to this. He states fatigued a lot. He is still a Higher education careers adviser. He is having problems with his left knee which comes and goes the chest wall pain on the left side has been giving him trouble for a few weeks. He indicates that his back is stable and still sees the neurosurgeon about this periodically. He had run out of his inhalers recently and has noticed more problems with wheezing. His home blood pressures have been running in the 120s over the 60s. The patient indicates that his sexual function is normal.         Patient Active Problem List   Diagnosis Date Noted  . Cervical disc disorder with radiculopathy of cervical region 06/19/2013  . Vitamin D deficiency 06/19/2013  . History of diverticulitis of colon 12/08/2012  . Anxiety 11/09/2012  . History of asthma 11/09/2012  . Low back pain syndrome 11/09/2012  . Spondylolisthesis of lumbar region 11/09/2012  . Testosterone deficiency 05/09/2012  . Syncope 07/29/2011  . HTN (hypertension) 07/29/2011  . Overweight 07/29/2011  . ESOPHAGEAL STRICTURE 01/10/2008  . DYSPHAGIA 01/10/2008  . GERD 01/09/2008  . PEPTIC STRICTURE 01/09/2008   Outpatient Encounter Prescriptions as of 03/02/2014  Medication Sig  . acetaminophen (TYLENOL) 325 MG tablet Take 650 mg by mouth every 6 (six) hours as needed for pain.  Marland Kitchen albuterol (VENTOLIN HFA) 108 (90 BASE) MCG/ACT inhaler Inhale 1 puff into the lungs every 6 (six) hours as needed for wheezing.  Marland Kitchen ALPRAZolam (XANAX) 0.5 MG tablet TAKE ONE TABLET BY MOUTH TWICE DAILY AS NEEDED  . Ascorbic Acid (VITAMIN C) 1000 MG tablet Take 1,000 mg by mouth 2 (two) times daily.  .  Cholecalciferol (VITAMIN D3) 5000 UNITS CAPS Take 5,000 Units by mouth daily.   Marland Kitchen dexlansoprazole (DEXILANT) 60 MG capsule TAKE ONE  BY MOUTH ONCE DAILY  . escitalopram (LEXAPRO) 20 MG tablet TAKE ONE-HALF TO ONE TABLET BY MOUTH ONCE DAILY  . fexofenadine (ALLEGRA) 180 MG tablet Take 180 mg by mouth daily as needed (allergies).   . Magnesium Oxide (MAG-OX PO) Take 1 tablet by mouth daily.   . Melatonin 10 MG TABS Take 10 mg by mouth every evening.   . Multiple Vitamin (MULTIVITAMIN) tablet Take 1 tablet by mouth daily.  . nebivolol (BYSTOLIC) 10 MG tablet Take 1 tablet (10 mg total) by mouth 2 (two) times daily.  Marland Kitchen omega-3 acid ethyl esters (LOVAZA) 1 G capsule TAKE ONE CAPSULE BY MOUTH TWICE DAILY  . SYMBICORT 160-4.5 MCG/ACT inhaler INHALE TWO PUFFS BY MOUTH TWICE DAILY, RINSE MOUTH AFTER USING  . Testosterone (AXIRON) 30 MG/ACT SOLN Apply 2 application topically daily. To each arm  . traMADol (ULTRAM) 50 MG tablet Take 50 mg by mouth every 6 (six) hours as needed for pain.   . [DISCONTINUED] oxyCODONE-acetaminophen (PERCOCET/ROXICET) 5-325 MG per tablet Take 1-2 tablets by mouth every 4 (four) hours as needed for pain.    Review of Systems  Constitutional: Negative.   HENT: Negative.   Eyes: Negative.   Respiratory: Negative.   Cardiovascular: Negative.   Gastrointestinal: Negative.   Endocrine: Negative.   Genitourinary: Negative.   Musculoskeletal: Positive for  myalgias (left side chest pains- for 3-4 wks) and arthralgias (left knee pain).  Skin: Negative.   Allergic/Immunologic: Negative.   Neurological: Negative.   Hematological: Negative.   Psychiatric/Behavioral: Negative.        Objective:   Physical Exam  Constitutional: He is oriented to person, place, and time. He appears well-developed and well-nourished. No distress.  HENT:  Head: Normocephalic and atraumatic.  Right Ear: External ear normal.  Left Ear: External ear normal.  Nose: Nose normal.  Mouth/Throat:  Oropharynx is clear and moist. No oropharyngeal exudate.  Eyes: Conjunctivae and EOM are normal. Pupils are equal, round, and reactive to light. Right eye exhibits no discharge. Left eye exhibits no discharge. No scleral icterus.  Neck: Normal range of motion. Neck supple. No thyromegaly present.  Neck without bruits or thyroid  Cardiovascular: Normal rate, regular rhythm, normal heart sounds and intact distal pulses.  Exam reveals no gallop and no friction rub.   No murmur heard. The heart has a regular rate and rhythm at 72/m without murmurs or gallops  Pulmonary/Chest: Effort normal and breath sounds normal. No respiratory distress. He has no wheezes. He has no rales. He exhibits no tenderness.  The chest was clear anteriorly and posteriorly without wheezing.  Abdominal: Soft. Bowel sounds are normal. He exhibits no mass. There is no tenderness. There is no rebound and no guarding.  The abdomen is obese and there is slight left lower quadrant tenderness  Genitourinary: Rectum normal and penis normal.  The prostate was minimally enlarged and smooth without lumps or masses. There were no rectal masses. The external genitalia were normal without hernias. There are no inguinal adenopathy.  Musculoskeletal: Normal range of motion. He exhibits no edema or tenderness.  Lymphadenopathy:    He has no cervical adenopathy.  Neurological: He is alert and oriented to person, place, and time. He has normal reflexes. No cranial nerve deficit.  Skin: Skin is warm and dry. No rash noted. No erythema. No pallor.  The skin was dry on his lower extremities  Psychiatric: He has a normal mood and affect. His behavior is normal. Judgment and thought content normal.  Nursing note and vitals reviewed.  BP 102/60 mmHg  Pulse 65  Temp(Src) 97 F (36.1 C) (Oral)  Ht 5' 9"  (1.753 m)  Wt 258 lb (117.028 kg)  BMI 38.08 kg/m2  EKG: Within normal limits       Assessment & Plan:  1. Essential  hypertension -Continue current treatment - POCT CBC; Future - BMP8+EGFR; Future - Hepatic function panel; Future - EKG 12-Lead  2. Testosterone deficiency -Continue current treatment - POCT CBC; Future - PSA, total and free; Future - Testosterone,Free and Total; Future - POCT UA - Microscopic Only - POCT urinalysis dipstick  3. Vitamin D deficiency -Continue current treatment pending lab work to be returned - POCT CBC; Future - Vit D  25 hydroxy (rtn osteoporosis monitoring); Future  4. Gastroesophageal reflux disease, esophagitis presence not specified -Continued excellent and avoid fried foods and NSAIDs. - POCT CBC; Future  5. Hyperlipidemia -Continue current treatment pending lab work returned - POCT CBC; Future - NMR, lipoprofile; Future  6. Left sided chest pain -Take Tylenol and use warm wet compresses -If pain continues refer back to cardiology for additional stress test - EKG 12-Lead  7. Other fatigue -Arrange for sleep study - Ambulatory referral to Pulmonology  8. Snoring -Sleep study - Ambulatory referral to Pulmonology  9. Left knee pain -Exercises for chondromalacia  10. BPH (benign prostatic hyperplasia) -We will check prostate yearly  Meds ordered this encounter  Medications  . Testosterone (AXIRON) 30 MG/ACT SOLN    Sig: Apply 2 application topically daily. To each arm    Dispense:  180 mL    Refill:  5  . ALPRAZolam (XANAX) 0.5 MG tablet    Sig: Take 1 tablet (0.5 mg total) by mouth 2 (two) times daily as needed.    Dispense:  60 tablet    Refill:  1   Patient Instructions  Continue current medications. Continue good therapeutic lifestyle changes which include good diet and exercise. Fall precautions discussed with patient. If an FOBT was given today- please return it to our front desk. If you are over 55 years old - you may need Prevnar 96 or the adult Pneumonia vaccine.  Flu Shots are still available at our office. If you still  haven't had one please call to set up a nurse visit to get one.   After your visit with [place] today you will receive a survey in the mail or online from [employer] regarding your care with [place]. Please take a moment to fill this out. Your feedback is very important to [place] as you can help [place] better understand your patient needs as well as improve your experience and satisfaction. WE CARE ABOUT YOU!!!  The patient should do knee exercises and if the knee pain doesn't improve we should get him see the orthopedic surgeon Because of the increased snoring and fatigue we will arrange for him to have a sleep study He should use in inhalers regularly and try not to run out of these. If the patient continues to have left chest pain, we should arrange for him to see the cardiologist and get another stress test. The last stress test was about 4 years ago. Remember to use scent free soaps, detergents, and fabric softeners. In the wintertime moisturizers are very helpful for the skin.   Roy Senate MD

## 2014-03-02 NOTE — Patient Instructions (Addendum)
Continue current medications. Continue good therapeutic lifestyle changes which include good diet and exercise. Fall precautions discussed with patient. If an FOBT was given today- please return it to our front desk. If you are over 46 years old - you may need Prevnar 68 or the adult Pneumonia vaccine.  Flu Shots are still available at our office. If you still haven't had one please call to set up a nurse visit to get one.   After your visit with [place] today you will receive a survey in the mail or online from [employer] regarding your care with [place]. Please take a moment to fill this out. Your feedback is very important to [place] as you can help [place] better understand your patient needs as well as improve your experience and satisfaction. WE CARE ABOUT YOU!!!  The patient should do knee exercises and if the knee pain doesn't improve we should get him see the orthopedic surgeon Because of the increased snoring and fatigue we will arrange for him to have a sleep study He should use in inhalers regularly and try not to run out of these. If the patient continues to have left chest pain, we should arrange for him to see the cardiologist and get another stress test. The last stress test was about 4 years ago. Remember to use scent free soaps, detergents, and fabric softeners. In the wintertime moisturizers are very helpful for the skin.

## 2014-03-06 ENCOUNTER — Telehealth: Payer: Self-pay | Admitting: Family Medicine

## 2014-03-07 ENCOUNTER — Encounter: Payer: Self-pay | Admitting: Family Medicine

## 2014-03-07 ENCOUNTER — Other Ambulatory Visit (INDEPENDENT_AMBULATORY_CARE_PROVIDER_SITE_OTHER): Payer: PRIVATE HEALTH INSURANCE

## 2014-03-07 DIAGNOSIS — E559 Vitamin D deficiency, unspecified: Secondary | ICD-10-CM

## 2014-03-07 DIAGNOSIS — E785 Hyperlipidemia, unspecified: Secondary | ICD-10-CM

## 2014-03-07 DIAGNOSIS — E291 Testicular hypofunction: Secondary | ICD-10-CM

## 2014-03-07 DIAGNOSIS — K219 Gastro-esophageal reflux disease without esophagitis: Secondary | ICD-10-CM

## 2014-03-07 DIAGNOSIS — D696 Thrombocytopenia, unspecified: Secondary | ICD-10-CM | POA: Insufficient documentation

## 2014-03-07 DIAGNOSIS — I10 Essential (primary) hypertension: Secondary | ICD-10-CM

## 2014-03-07 LAB — POCT CBC
Granulocyte percent: 58 %G (ref 37–80)
HCT, POC: 49.2 % (ref 43.5–53.7)
Hemoglobin: 15.3 g/dL (ref 14.1–18.1)
Lymph, poc: 1.7 (ref 0.6–3.4)
MCH, POC: 27.5 pg (ref 27–31.2)
MCHC: 31.2 g/dL — AB (ref 31.8–35.4)
MCV: 88.2 fL (ref 80–97)
MPV: 10.3 fL (ref 0–99.8)
POC Granulocyte: 2.7 (ref 2–6.9)
POC LYMPH PERCENT: 36.4 %L (ref 10–50)
Platelet Count, POC: 107 10*3/uL — AB (ref 142–424)
RBC: 5.6 M/uL (ref 4.69–6.13)
RDW, POC: 12.7 %
WBC: 4.6 10*3/uL (ref 4.6–10.2)

## 2014-03-07 NOTE — Progress Notes (Signed)
Lab only 

## 2014-03-09 LAB — TESTOSTERONE,FREE AND TOTAL
Testosterone, Free: 28.3 pg/mL — ABNORMAL HIGH (ref 6.8–21.5)
Testosterone: 693 ng/dL (ref 348–1197)

## 2014-03-09 LAB — PSA, TOTAL AND FREE
PSA, Free Pct: 35 %
PSA, Free: 0.28 ng/mL
PSA: 0.8 ng/mL (ref 0.0–4.0)

## 2014-03-09 LAB — NMR, LIPOPROFILE
Cholesterol: 169 mg/dL (ref 100–199)
HDL Cholesterol by NMR: 31 mg/dL — ABNORMAL LOW (ref >39)
HDL Particle Number: 27.3 umol/L — ABNORMAL LOW (ref >=30.5)
LDL Particle Number: 1255 nmol/L — ABNORMAL HIGH (ref <1000)
LDL Size: 20.6 nm (ref >20.5)
LDL-C: 103 mg/dL — ABNORMAL HIGH (ref 0–99)
LP-IR Score: 89 — ABNORMAL HIGH (ref <=45)
Small LDL Particle Number: 426 nmol/L (ref <=527)
Triglycerides by NMR: 176 mg/dL — ABNORMAL HIGH (ref 0–149)

## 2014-03-09 LAB — HEPATIC FUNCTION PANEL
ALT: 50 IU/L — ABNORMAL HIGH (ref 0–44)
AST: 30 IU/L (ref 0–40)
Albumin: 4.4 g/dL (ref 3.5–5.5)
Alkaline Phosphatase: 51 IU/L (ref 39–117)
Bilirubin, Direct: 0.15 mg/dL (ref 0.00–0.40)
Total Bilirubin: 0.7 mg/dL (ref 0.0–1.2)
Total Protein: 6.4 g/dL (ref 6.0–8.5)

## 2014-03-09 LAB — BMP8+EGFR
BUN/Creatinine Ratio: 10 (ref 9–20)
BUN: 13 mg/dL (ref 6–24)
CO2: 27 mmol/L (ref 18–29)
Calcium: 9.3 mg/dL (ref 8.7–10.2)
Chloride: 100 mmol/L (ref 97–108)
Creatinine, Ser: 1.25 mg/dL (ref 0.76–1.27)
GFR calc Af Amer: 80 mL/min/{1.73_m2} (ref >59)
GFR calc non Af Amer: 69 mL/min/{1.73_m2} (ref >59)
Glucose: 98 mg/dL (ref 65–99)
Potassium: 4.7 mmol/L (ref 3.5–5.2)
Sodium: 139 mmol/L (ref 134–144)

## 2014-03-09 LAB — VITAMIN D 25 HYDROXY (VIT D DEFICIENCY, FRACTURES): Vit D, 25-Hydroxy: 33.1 ng/mL (ref 30.0–100.0)

## 2014-03-26 ENCOUNTER — Telehealth: Payer: Self-pay | Admitting: Family Medicine

## 2014-03-26 NOTE — Telephone Encounter (Signed)
Stp he needs an rx for nexium 20 mg 2 a day since his insurance is no longer paying for dexilant. If approved will go to [facility] in [place].

## 2014-03-26 NOTE — Telephone Encounter (Signed)
You can do 20 twice a day or 40 twice a day. I would recommend to start with 20 twice a day and make sure this controls his symptoms first and if it works we will continue this

## 2014-03-27 MED ORDER — ESOMEPRAZOLE MAGNESIUM 20 MG PO CPDR: 20.0000 mg | DELAYED_RELEASE_CAPSULE | Freq: Two times a day (BID) | ORAL | Status: DC

## 2014-03-27 NOTE — Telephone Encounter (Signed)
Pt aware and med sent to pharm 

## 2014-04-04 ENCOUNTER — Other Ambulatory Visit: Payer: Self-pay | Admitting: Family Medicine

## 2014-04-05 NOTE — Telephone Encounter (Signed)
Last seen 03/02/14. Looks like there was a refill left, but pharmacy sent for one

## 2014-04-20 ENCOUNTER — Encounter: Payer: Self-pay | Admitting: Pulmonary Disease

## 2014-04-20 ENCOUNTER — Ambulatory Visit (INDEPENDENT_AMBULATORY_CARE_PROVIDER_SITE_OTHER): Payer: PRIVATE HEALTH INSURANCE | Admitting: Pulmonary Disease

## 2014-04-20 VITALS — BP 124/72 | HR 53 | Temp 97.0°F | Ht 69.0 in | Wt 257.2 lb

## 2014-04-20 DIAGNOSIS — G4733 Obstructive sleep apnea (adult) (pediatric): Secondary | ICD-10-CM | POA: Insufficient documentation

## 2014-04-20 NOTE — Patient Instructions (Signed)
Will schedule for home sleep testing, and arrange for followup once the results are available. Work on weight loss

## 2014-04-20 NOTE — Progress Notes (Signed)
Subjective:    Patient ID: Roy Lawrence, male    DOB: [birth year], 44 y.o.   MRN: [id number]  HPI The patient is a 44 year old male who I've been asked to see for possible obstructive sleep apnea. He has been noted to have loud snoring as well as witnessed apneas by his bed partner, and he also admits to having snoring arousals. He has frequent awakenings at night, as well as nonrestorative sleep. He notes intermittent sleep pressure during the day with periods of inactivity, and his spouse states that he will doze in the evening while watching television or movies. He also notes sleep pressure driving longer distances, and occasionally with shorter distances. Complicating all of this is that he works rotating shifts as a Engineer, structural. This can certainly contribute to his sleepiness issues. The patient states that his weight is up about 10 pounds over the last 2 years, and his Epworth score today is 10   Sleep Questionnaire What time do you typically go to bed?( Between what hours) 8:30p-9:30 or 7:30a-8:30 a. pt works 1st and 3 rd shift 8:30p-9:30 or 7:30a-8:30 a. pt works 1st and 3 rd shift at 1337 on 04/20/14 by Inge Rise, CMA How long does it take you to fall asleep? 36min-1 1/2 hrs 44min-1 1/2 hrs at 1337 on 04/20/14 by Inge Rise, CMA How many times during the night do you wake up? 3 3 at 1337 on 04/20/14 by Inge Rise, [place] What time do you get out of bed to start your day? 0400 0400 at 1337 on 04/20/14 by Inge Rise, CMA Do you drive or operate heavy machinery in your occupation? Yes Yes at 1337 on 04/20/14 by Inge Rise, CMA How much has your weight changed (up or down) over the past two years? (In pounds) 10 lb (4.536 kg) 10 lb (4.536 kg) at 1337 on 04/20/14 by Inge Rise, CMA Have you ever had a sleep study before? No No at 1337 on 04/20/14 by Inge Rise, CMA Do you currently use CPAP? No No at 1337 on 04/20/14 by Inge Rise, CMA Do you wear  oxygen at any time? No No at 1337 on 04/20/14 by Inge Rise, CMA   Review of Systems  Constitutional: Negative for fever, chills, activity change, appetite change and unexpected weight change.  HENT: Positive for dental problem and trouble swallowing. Negative for congestion, postnasal drip, rhinorrhea, sneezing, sore throat and voice change.   Eyes: Negative for visual disturbance.  Respiratory: Positive for shortness of breath. Negative for cough and choking.   Cardiovascular: Negative for chest pain and leg swelling.  Gastrointestinal: Negative for nausea, vomiting and abdominal pain.  Genitourinary: Negative for difficulty urinating.  Musculoskeletal: Negative for arthralgias.  Skin: Negative for rash.  Neurological: Positive for headaches.  Psychiatric/Behavioral: Negative for behavioral problems and confusion. The patient is nervous/anxious.        Objective:   Physical Exam Constitutional:  Obese male, no acute distress  HENT:  Nares patent without discharge, but narrowed bilat.  Oropharynx without exudate, palate and uvula are mildly elongated.  Eyes:  Perrla, eomi, no scleral icterus  Neck:  No JVD, no TMG  Cardiovascular:  Normal rate, regular rhythm, no rubs or gallops.  No murmurs        Intact distal pulses  Pulmonary :  Normal breath sounds, no stridor or respiratory distress   No rales, rhonchi, or wheezing  Abdominal:  Soft, nondistended, bowel sounds  present.  No tenderness noted.   Musculoskeletal:  No lower extremity edema noted.  Lymph Nodes:  No cervical lymphadenopathy noted  Skin:  No cyanosis noted  Neurologic:  Alert, appropriate, moves all 4 extremities without obvious deficit.         Assessment & Plan:

## 2014-04-20 NOTE — Assessment & Plan Note (Signed)
The patient's history is very suggestive of clinically significant sleep disordered breathing. I have had a long discussion with him about the pathophysiology of sleep apnea, including its impact to his quality of life and cardiovascular health. Will need to have a sleep study for diagnosis, and I think he is a good candidate for home sleep testing. The patient is agreeable to this approach.

## 2014-05-18 DIAGNOSIS — G4733 Obstructive sleep apnea (adult) (pediatric): Secondary | ICD-10-CM | POA: Diagnosis not present

## 2014-05-23 ENCOUNTER — Other Ambulatory Visit: Payer: Self-pay | Admitting: *Deleted

## 2014-05-23 ENCOUNTER — Telehealth: Payer: Self-pay | Admitting: Pulmonary Disease

## 2014-05-23 DIAGNOSIS — G4733 Obstructive sleep apnea (adult) (pediatric): Secondary | ICD-10-CM | POA: Diagnosis not present

## 2014-05-23 NOTE — Telephone Encounter (Signed)
Pt needs ov to review sleep study 

## 2014-05-23 NOTE — Telephone Encounter (Signed)
Called pt and appt scheduled for 4/27 at 4:30. Nothing further needed

## 2014-05-23 NOTE — Telephone Encounter (Signed)
[id number] x1 

## 2014-05-30 ENCOUNTER — Encounter: Payer: Self-pay | Admitting: Pulmonary Disease

## 2014-05-30 ENCOUNTER — Ambulatory Visit (INDEPENDENT_AMBULATORY_CARE_PROVIDER_SITE_OTHER): Payer: PRIVATE HEALTH INSURANCE | Admitting: Pulmonary Disease

## 2014-05-30 VITALS — BP 114/62 | HR 64 | Temp 97.7°F | Ht 69.0 in | Wt 256.8 lb

## 2014-05-30 DIAGNOSIS — G4733 Obstructive sleep apnea (adult) (pediatric): Secondary | ICD-10-CM

## 2014-05-30 NOTE — Patient Instructions (Signed)
Will arrange for cpap at a moderate pressure level.  Please call if having issues with tolerance. Work on weight loss followup again in 12 weeks.

## 2014-05-30 NOTE — Assessment & Plan Note (Signed)
The patient has mild to moderate obstructive sleep apnea by his recent home sleep test, and I have reviewed both a conservative and aggressive approach to treatment. The conservative approach would be a trial of weight loss alone and avoiding supine position while sleeping. A more aggressive approach would be a trial of a dental appliance or CPAP while trying to work on the weight loss. After a long discussion, he and I both agreed that he would do best with more aggressive approach, and he would like to start with C Pap.

## 2014-05-30 NOTE — Progress Notes (Signed)
   Subjective:    Patient ID: Roy Lawrence, male    DOB: 1970-11-23, 44 y.o.   MRN: [id number]  HPI The patient comes in today for follow-up of his recent home sleep test. He was found to have mild to moderate OSA, with an AHI of 15 events per hour and oxygen desaturation transiently as low as 70%. I have reviewed the study with he and his wife in detail, and answered all of their questions.   Review of Systems  Constitutional: Negative for fever and unexpected weight change.  HENT: Negative for congestion, dental problem, ear pain, nosebleeds, postnasal drip, rhinorrhea, sinus pressure, sneezing, sore throat and trouble swallowing.   Eyes: Negative for redness and itching.  Respiratory: Negative for cough, chest tightness, shortness of breath and wheezing.   Cardiovascular: Negative for palpitations and leg swelling.  Gastrointestinal: Negative for nausea and vomiting.  Genitourinary: Negative for dysuria.  Musculoskeletal: Negative for joint swelling.  Skin: Negative for rash.  Neurological: Negative for headaches.  Hematological: Does not bruise/bleed easily.  Psychiatric/Behavioral: Negative for dysphoric mood. The patient is not nervous/anxious.        Objective:   Physical Exam Obese male in no acute distress Nose without purulence or discharge noted Neck without lymphadenopathy or thyromegaly Lower extremities without edema, no cyanosis Alert and oriented, moves all 4 extremities.       Assessment & Plan:

## 2014-06-13 ENCOUNTER — Other Ambulatory Visit: Payer: Self-pay | Admitting: *Deleted

## 2014-06-13 ENCOUNTER — Telehealth: Payer: Self-pay | Admitting: Family Medicine

## 2014-06-13 NOTE — Telephone Encounter (Signed)
Last filled 05/06/14, last seen 03/02/14, Call in at [facility] [phone]

## 2014-06-15 MED ORDER — ALPRAZOLAM 0.5 MG PO TABS: 0.5000 mg | ORAL_TABLET | Freq: Two times a day (BID) | ORAL | Status: DC | PRN

## 2014-08-07 ENCOUNTER — Telehealth: Payer: Self-pay | Admitting: Family Medicine

## 2014-08-07 NOTE — Telephone Encounter (Signed)
Stp and he is c/o abdominal discomfort with full bladder and weaker flow, pt also states he had a PSA level drawn in April and it had went up from Feb level. Pt is going to bring those results in when he comes to his appt with Osa Craver 7/8 at 8:25.

## 2014-08-10 ENCOUNTER — Ambulatory Visit (INDEPENDENT_AMBULATORY_CARE_PROVIDER_SITE_OTHER): Payer: PRIVATE HEALTH INSURANCE | Admitting: Physician Assistant

## 2014-08-10 ENCOUNTER — Encounter: Payer: Self-pay | Admitting: Physician Assistant

## 2014-08-10 VITALS — BP 118/80 | HR 54 | Temp 97.0°F | Ht 69.0 in | Wt 258.8 lb

## 2014-08-10 DIAGNOSIS — R300 Dysuria: Secondary | ICD-10-CM | POA: Diagnosis not present

## 2014-08-10 LAB — POCT UA - MICROSCOPIC ONLY
Bacteria, U Microscopic: NEGATIVE
Casts, Ur, LPF, POC: NEGATIVE
Crystals, Ur, HPF, POC: NEGATIVE
Yeast, UA: NEGATIVE

## 2014-08-10 LAB — POCT URINALYSIS DIPSTICK
Bilirubin, UA: NEGATIVE
Blood, UA: NEGATIVE
Glucose, UA: NEGATIVE
Ketones, UA: NEGATIVE
Leukocytes, UA: NEGATIVE
Nitrite, UA: NEGATIVE
Protein, UA: NEGATIVE
Spec Grav, UA: 1.015
Urobilinogen, UA: NEGATIVE
pH, UA: 5

## 2014-08-10 NOTE — Patient Instructions (Signed)

## 2014-08-10 NOTE — Progress Notes (Signed)
Subjective:     Patient ID: Roy Lawrence, male   DOB: Feb 18, 1970, 44 y.o.   MRN: [id number]  HPI Pt with 6 month intermit hx of lower abd pain This is assoc with some urinary sx Sx will resolve in a day but this time has lasted for several days He has noted an increase in his PSA over this time  Review of Systems  Constitutional: Negative for fever, chills, activity change, appetite change and fatigue.  Gastrointestinal: Negative.   Genitourinary: Positive for dysuria, frequency, decreased urine volume and difficulty urinating. Negative for hematuria, penile swelling, scrotal swelling, penile pain and testicular pain.       Objective:   Physical Exam  Constitutional: He appears well-developed and well-nourished.  Cardiovascular: Normal rate, regular rhythm and normal heart sounds.   Pulmonary/Chest: Effort normal and breath sounds normal.  Abdominal: Soft. Bowel sounds are normal. He exhibits no distension and no mass. There is tenderness. There is no rebound and no guarding.  Suprapubic TTP  Nursing note and vitals reviewed.      Assessment:     Dysuria- prob prostatitis    Plan:     Septra DS bid x 2weeks Nl course reviewed Pt has f/u with [facility] near that time F/U sooner if sx worsen

## 2014-08-21 ENCOUNTER — Other Ambulatory Visit: Payer: Self-pay | Admitting: Family Medicine

## 2014-09-10 ENCOUNTER — Encounter: Payer: Self-pay | Admitting: Family Medicine

## 2014-09-10 ENCOUNTER — Ambulatory Visit (INDEPENDENT_AMBULATORY_CARE_PROVIDER_SITE_OTHER): Payer: PRIVATE HEALTH INSURANCE | Admitting: Family Medicine

## 2014-09-10 VITALS — BP 121/78 | HR 59 | Temp 97.1°F | Ht 69.0 in | Wt 257.2 lb

## 2014-09-10 DIAGNOSIS — E785 Hyperlipidemia, unspecified: Secondary | ICD-10-CM | POA: Diagnosis not present

## 2014-09-10 DIAGNOSIS — E559 Vitamin D deficiency, unspecified: Secondary | ICD-10-CM

## 2014-09-10 DIAGNOSIS — I10 Essential (primary) hypertension: Secondary | ICD-10-CM

## 2014-09-10 DIAGNOSIS — N400 Enlarged prostate without lower urinary tract symptoms: Secondary | ICD-10-CM

## 2014-09-10 DIAGNOSIS — K219 Gastro-esophageal reflux disease without esophagitis: Secondary | ICD-10-CM

## 2014-09-10 DIAGNOSIS — E291 Testicular hypofunction: Secondary | ICD-10-CM | POA: Diagnosis not present

## 2014-09-10 DIAGNOSIS — E349 Endocrine disorder, unspecified: Secondary | ICD-10-CM

## 2014-09-10 LAB — POCT URINALYSIS DIPSTICK
Bilirubin, UA: NEGATIVE
Blood, UA: NEGATIVE
Glucose, UA: NEGATIVE
Ketones, UA: NEGATIVE
Leukocytes, UA: NEGATIVE
Nitrite, UA: NEGATIVE
Protein, UA: NEGATIVE
Spec Grav, UA: >=1.03
Urobilinogen, UA: NEGATIVE
pH, UA: 5

## 2014-09-10 LAB — POCT UA - MICROSCOPIC ONLY
Bacteria, U Microscopic: NEGATIVE
Casts, Ur, LPF, POC: NEGATIVE
Crystals, Ur, HPF, POC: NEGATIVE
Epithelial cells, urine per micros: NEGATIVE
Mucus, UA: NEGATIVE
RBC, urine, microscopic: NEGATIVE
WBC, Ur, HPF, POC: NEGATIVE
Yeast, UA: NEGATIVE

## 2014-09-10 NOTE — Progress Notes (Signed)
Subjective:    Patient ID: Roy Lawrence, male    DOB: Dec 19, 1970, 44 y.o.   MRN: [id number]  HPI  Pt here for follow up and management of chronic medical problems which consist of hypertension and hyperlipidemia.  He takes medications regularly. The patient brings in lab work from the outside that was done in April of this year. His hemoglobin was good at 15.7 and the platelet was slightly decreased and this is been the case in the past. 141,000. The blood sugar was good, the electrolytes including the creatinine were good also. Cholesterol numbers with traditional lipid testing have a triglyceride that was slightly elevated at 168 and LDL C cholesterol that was slightly elevated at 102. This PSA was 1.6 and previously this was 0.8. The TSH was within normal limits. All of this was reviewed with him again today. A copy will be scanned into the record. 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 He has had a work over 60 hours of overtime the past month. He is under a lot of stress. He has had some problems with his asthma recently. He denies chest pain except she does have occasional tightness which he says is related to gas and belching and not associated with activity. He has no trouble swallowing his food, no heartburn no indigestion no blood in the stool or black tarry bowel movements. He is passing his water without problems. He is using a CPAP and he can't tell that this helps a lot but he does rest more with this. His wife thinks that the testosterone replacement is helpful and that when he is taking this he is less agitated.             Patient Active Problem List   Diagnosis Date Noted  . OSA (obstructive sleep apnea) 04/20/2014  . Thrombocytopenia 03/07/2014  . BPH (benign prostatic hyperplasia) 03/02/2014  . Hyperlipidemia 03/02/2014  . Cervical disc disorder with radiculopathy of cervical region 06/19/2013  .  Vitamin D deficiency 06/19/2013  . History of diverticulitis of colon 12/08/2012  . Anxiety 11/09/2012  . History of asthma 11/09/2012  . Low back pain syndrome 11/09/2012  . Spondylolisthesis of lumbar region 11/09/2012  . Testosterone deficiency 05/09/2012  . Syncope 07/29/2011  . HTN (hypertension) 07/29/2011  . Overweight(278.02) 07/29/2011  . ESOPHAGEAL STRICTURE 01/10/2008  . DYSPHAGIA 01/10/2008  . GERD 01/09/2008  . PEPTIC STRICTURE 01/09/2008   Outpatient Encounter Prescriptions as of 09/10/2014  Medication Sig  . acetaminophen (TYLENOL) 325 MG tablet Take 650 mg by mouth every 6 (six) hours as needed for pain.  Marland Kitchen albuterol (VENTOLIN HFA) 108 (90 BASE) MCG/ACT inhaler Inhale 1 puff into the lungs every 6 (six) hours as needed for wheezing.  Marland Kitchen ALPRAZolam (XANAX) 0.5 MG tablet Take 1 tablet (0.5 mg total) by mouth 2 (two) times daily as needed.  . budesonide-formoterol (SYMBICORT) 160-4.5 MCG/ACT inhaler INHALE TWO PUFFS BY MOUTH TWICE DAILY, RINSE MOUTH AFTER USING  . Cholecalciferol (VITAMIN D3) 5000 UNITS CAPS Take 5,000 Units by mouth daily.   Marland Kitchen escitalopram (LEXAPRO) 20 MG tablet TAKE ONE-HALF TO ONE TABLET BY MOUTH ONCE DAILY  . esomeprazole (NEXIUM) 20 MG capsule TAKE ONE CAPSULE BY MOUTH TWICE DAILY BEFORE A MEAL  . loratadine (CLARITIN) 10 MG tablet Take 10 mg by mouth daily.  . Magnesium Oxide (MAG-OX PO) Take 1 tablet by mouth daily.   . Melatonin 10  MG TABS Take 10 mg by mouth every evening.   . nebivolol (BYSTOLIC) 10 MG tablet Take 1 tablet (10 mg total) by mouth 2 (two) times daily. (Patient taking differently: Take 10 mg by mouth daily. )  . Testosterone (AXIRON) 30 MG/ACT SOLN Apply 2 application topically daily. To each arm  . traMADol (ULTRAM) 50 MG tablet Take 50 mg by mouth every 6 (six) hours as needed for pain.    No facility-administered encounter medications on file as of 09/10/2014.     Review of Systems  Constitutional: Negative.   HENT: Negative.         Muscle spasms left eye  Eyes: Negative.   Respiratory: Negative.   Cardiovascular: Negative.   Gastrointestinal: Negative.   Endocrine: Negative.   Genitourinary: Negative.   Musculoskeletal: Negative.   Skin: Negative.   Allergic/Immunologic: Negative.   Neurological: Negative.   Hematological: Negative.   Psychiatric/Behavioral: Negative.        Objective:   Physical Exam  Constitutional: He is oriented to person, place, and time. He appears well-developed and well-nourished. No distress.  HENT:  Head: Normocephalic and atraumatic.  Right Ear: External ear normal.  Left Ear: External ear normal.  Nose: Nose normal.  Mouth/Throat: Oropharynx is clear and moist. No oropharyngeal exudate.  Eyes: Conjunctivae and EOM are normal. Pupils are equal, round, and reactive to light. Right eye exhibits no discharge. Left eye exhibits no discharge. No scleral icterus.  Neck: Normal range of motion. Neck supple. No thyromegaly present.  No thyromegaly or anterior cervical adenopathy or carotid bruits  Cardiovascular: Normal rate, regular rhythm, normal heart sounds and intact distal pulses.  Exam reveals no gallop and no friction rub.   No murmur heard. At 60/m  Pulmonary/Chest: Effort normal and breath sounds normal. No respiratory distress. He has no wheezes. He has no rales. He exhibits no tenderness.  No axillary adenopathy and no wheezing or chest tightness. There is minimal congestion with coughing.  Abdominal: Soft. Bowel sounds are normal. He exhibits no mass. There is no tenderness. There is no rebound and no guarding.  Abdomen is obese without masses tenderness or organ enlargement and there was no inguinal adenopathy.  Genitourinary: Rectum normal and penis normal.  The prostate is slightly enlarged 83 or 4 out of 10. It is smooth to palpation and there are no rectal masses. There are no inguinal hernias. There were no inguinal nodes. External genitalia were within normal  limits.  Musculoskeletal: Normal range of motion. He exhibits no edema or tenderness.  Lymphadenopathy:    He has no cervical adenopathy.  Neurological: He is alert and oriented to person, place, and time. He has normal reflexes. No cranial nerve deficit.  He does have a slight twitch of the left eye which was noticeable during the visit today.  Skin: Skin is warm and dry. No rash noted. No erythema. No pallor.  Psychiatric: He has a normal mood and affect. His behavior is normal. Judgment and thought content normal.  Nursing note and vitals reviewed.   BP 121/78 mmHg  Pulse 59  Temp(Src) 97.1 F (36.2 C) (Oral)  Ht _0  (1.753 m)  Wt 257 lb 3.2 oz (116.665 kg)  BMI 37.96 kg/m2         Assessment & Plan:  1. Essential hypertension -The blood pressure is good today the patient should continue with his current treatment in addition to watching his sodium intake. - POCT CBC - BMP8+EGFR - Hepatic function panel  2. Vitamin D deficiency -The patient should continue with his current dose of vitamin D pending results of lab work - POCT CBC - Vit D  25 hydroxy (rtn osteoporosis monitoring)  3. Gastroesophageal reflux disease, esophagitis presence not specified -He is doing well with his reflux and stricture with no trouble swallowing and he should continue with his Nexium once daily - POCT CBC  4. Hyperlipidemia -Traditional cholesterol numbers in April were slightly elevated and we will follow-up with additional testing today and call him with the results. In the meantime he should continue with aggressive therapeutic lifestyle changes - POCT CBC - BMP8+EGFR - Hepatic function panel - NMR, lipoprofile - PSA - POCT urinalysis dipstick - POCT UA - Microscopic Only  5. BPH (benign prostatic hyperplasia) -The prostate was slightly enlarged and he is not having symptoms regarding this. - PSA - POCT urinalysis dipstick - POCT UA - Microscopic Only  6. Testosterone  deficiency -He continues to use his testosterone replacement. - Testosterone,Free and Total  No orders of the defined types were placed in this encounter.   Patient Instructions  Continue current medications. Continue good therapeutic lifestyle changes which include good diet and exercise. Fall precautions discussed with patient. If an FOBT was given today- please return it to our front desk. If you are over 77 years old - you may need Prevnar 38 or the adult Pneumonia vaccine.   After your visit with [place] today you will receive a survey in the mail or online from [employer] regarding your care with [place]. Please take a moment to fill this out. Your feedback is very important to [place] as you can help [place] better understand your patient needs as well as improve your experience and satisfaction. WE CARE ABOUT YOU!!!   The patient should drink more water and less caffeine if possible Continue the CPAP Continue follow-up with neurosurgeon regarding back Use inhalers as recommended Get as much rest as possible    Arrie Senate MD

## 2014-09-10 NOTE — Patient Instructions (Addendum)
Continue current medications. Continue good therapeutic lifestyle changes which include good diet and exercise. Fall precautions discussed with patient. If an FOBT was given today- please return it to our front desk. If you are over 46 years old - you may need Prevnar 7 or the adult Pneumonia vaccine.   After your visit with [place] today you will receive a survey in the mail or online from [employer] regarding your care with [place]. Please take a moment to fill this out. Your feedback is very important to [place] as you can help [place] better understand your patient needs as well as improve your experience and satisfaction. WE CARE ABOUT YOU!!!   The patient should drink more water and less caffeine if possible Continue the CPAP Continue follow-up with neurosurgeon regarding back Use inhalers as recommended Get as much rest as possible

## 2014-09-11 ENCOUNTER — Other Ambulatory Visit: Payer: Self-pay | Admitting: Family Medicine

## 2014-09-11 LAB — BMP8+EGFR
BUN/Creatinine Ratio: 13 (ref 9–20)
BUN: 16 mg/dL (ref 6–24)
CO2: 23 mmol/L (ref 18–29)
Calcium: 8.9 mg/dL (ref 8.7–10.2)
Chloride: 102 mmol/L (ref 97–108)
Creatinine, Ser: 1.22 mg/dL (ref 0.76–1.27)
GFR calc Af Amer: 82 mL/min/{1.73_m2} (ref >59)
GFR calc non Af Amer: 71 mL/min/{1.73_m2} (ref >59)
Glucose: 97 mg/dL (ref 65–99)
Potassium: 4.6 mmol/L (ref 3.5–5.2)
Sodium: 143 mmol/L (ref 134–144)

## 2014-09-11 LAB — CBC WITH DIFFERENTIAL/PLATELET
Basophils Absolute: 0.1 10*3/uL (ref 0.0–0.2)
Basos: 1 %
EOS (ABSOLUTE): 0.2 10*3/uL (ref 0.0–0.4)
Eos: 3 %
Hematocrit: 45.2 % (ref 37.5–51.0)
Hemoglobin: 15.2 g/dL (ref 12.6–17.7)
Immature Grans (Abs): 0 10*3/uL (ref 0.0–0.1)
Immature Granulocytes: 0 %
Lymphocytes Absolute: 1.7 10*3/uL (ref 0.7–3.1)
Lymphs: 36 %
MCH: 29.1 pg (ref 26.6–33.0)
MCHC: 33.6 g/dL (ref 31.5–35.7)
MCV: 86 fL (ref 79–97)
Monocytes Absolute: 0.4 10*3/uL (ref 0.1–0.9)
Monocytes: 8 %
Neutrophils Absolute: 2.3 10*3/uL (ref 1.4–7.0)
Neutrophils: 52 %
Platelets: 138 10*3/uL — ABNORMAL LOW (ref 150–379)
RBC: 5.23 x10E6/uL (ref 4.14–5.80)
RDW: 13.7 % (ref 12.3–15.4)
WBC: 4.6 10*3/uL (ref 3.4–10.8)

## 2014-09-11 LAB — NMR, LIPOPROFILE
Cholesterol: 161 mg/dL (ref 100–199)
HDL Cholesterol by NMR: 35 mg/dL — ABNORMAL LOW (ref >39)
HDL Particle Number: 27.2 umol/L — ABNORMAL LOW (ref >=30.5)
LDL Particle Number: 1192 nmol/L — ABNORMAL HIGH (ref <1000)
LDL Size: 20.7 nm (ref >20.5)
LDL-C: 96 mg/dL (ref 0–99)
LP-IR Score: 77 — ABNORMAL HIGH (ref <=45)
Small LDL Particle Number: 453 nmol/L (ref <=527)
Triglycerides by NMR: 148 mg/dL (ref 0–149)

## 2014-09-11 LAB — TESTOSTERONE,FREE AND TOTAL
Testosterone, Free: 28.5 pg/mL — ABNORMAL HIGH (ref 6.8–21.5)
Testosterone: 914 ng/dL (ref 348–1197)

## 2014-09-11 LAB — HEPATIC FUNCTION PANEL
ALT: 60 IU/L — ABNORMAL HIGH (ref 0–44)
AST: 25 IU/L (ref 0–40)
Albumin: 4.3 g/dL (ref 3.5–5.5)
Alkaline Phosphatase: 58 IU/L (ref 39–117)
Bilirubin Total: 0.3 mg/dL (ref 0.0–1.2)
Bilirubin, Direct: 0.09 mg/dL (ref 0.00–0.40)
Total Protein: 6.1 g/dL (ref 6.0–8.5)

## 2014-09-11 LAB — PSA: Prostate Specific Ag, Serum: 1 ng/mL (ref 0.0–4.0)

## 2014-09-11 LAB — VITAMIN D 25 HYDROXY (VIT D DEFICIENCY, FRACTURES): Vit D, 25-Hydroxy: 37 ng/mL (ref 30.0–100.0)

## 2014-09-12 NOTE — Telephone Encounter (Signed)
Last filled 08/13/14, last seen 09/10/14. Call into [facility]

## 2014-09-18 ENCOUNTER — Ambulatory Visit: Payer: PRIVATE HEALTH INSURANCE | Admitting: Pulmonary Disease

## 2014-09-20 ENCOUNTER — Other Ambulatory Visit: Payer: Self-pay | Admitting: Family Medicine

## 2014-11-14 ENCOUNTER — Ambulatory Visit (INDEPENDENT_AMBULATORY_CARE_PROVIDER_SITE_OTHER): Payer: PRIVATE HEALTH INSURANCE | Admitting: Pulmonary Disease

## 2014-11-14 ENCOUNTER — Encounter: Payer: Self-pay | Admitting: Pulmonary Disease

## 2014-11-14 VITALS — BP 118/84 | HR 65 | Temp 97.3°F | Ht 69.0 in | Wt 252.8 lb

## 2014-11-14 DIAGNOSIS — G4733 Obstructive sleep apnea (adult) (pediatric): Secondary | ICD-10-CM

## 2014-11-14 DIAGNOSIS — G4700 Insomnia, unspecified: Secondary | ICD-10-CM | POA: Diagnosis not present

## 2014-11-14 DIAGNOSIS — E669 Obesity, unspecified: Secondary | ICD-10-CM | POA: Diagnosis not present

## 2014-11-14 DIAGNOSIS — G4726 Circadian rhythm sleep disorder, shift work type: Secondary | ICD-10-CM | POA: Diagnosis not present

## 2014-11-14 DIAGNOSIS — Z9989 Dependence on other enabling machines and devices: Principal | ICD-10-CM

## 2014-11-14 NOTE — Progress Notes (Signed)
Chief Complaint  Patient presents with  . Follow-up    Former [facility] pt following for OSA. pt states he is doing well. pt states he does sleep better but occasionaly does still wake up restless. pt sttaes he is still working on the temperature in the hose. pt using CPAP every night for at least 5 hours hours. pressure and mask good for pt. DME: [facility]     History of Present Illness: Roy Lawrence is a 44 y.o. male with OSA.  He was previously followed by Dr. Gwenette Greet.  He has been doing well with CPAP.  He has no issues with his mask fit.  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  He will take melatonin before going to sleep.  He has been using xanax before bedtime to help him slow down and fall asleep.  He has been using xanax for about 2 years.  He will sometimes wake up during the night to use the bathroom, but usually will sleep through the night.   TESTS: HST 05/18/14 >> AHI 15 Auto CPAP 10/14/14 to 11/12/14 >> used on 27 of 30 nights with average 5 hrs and 22 min.  Average AHI is 2.2 with median CPAP 7 cm H2O and 95 th percentile CPAP 9 cm H20.   PMhx >> Asthma, Allergies, HTN, Spondylosis, GERD, Esophageal stricture, Diverticulosis, Anxiety, Depression  Past surgical hx, Medications, Allergies, Family hx, Social hx all reviewed.   Physical Exam: BP 118/84 mmHg  Pulse 65  Temp(Src) 97.3 F (36.3 C) (Oral)  Ht 5\' 9"  (1.753 m)  Wt 252 lb 12.8 oz (114.669 kg)  BMI 37.31 kg/m2  SpO2 95%  General - No distress ENT - No sinus tenderness, no oral exudate, no LAN, MP 4 Cardiac - s1s2 regular, no murmur Chest - No wheeze/rales/dullness Back - No focal tenderness Abd - Soft, non-tender Ext - No edema Neuro - Normal strength Skin - No rashes Psych - normal mood, and behavior   Assessment/Plan:  Obstructive sleep apnea. He is compliant with CPAP and reports benefit. Plan: - continue auto CPAP - advised him  to try using his CPAP during naps as able  Obesity. Plan: - discussed importance of weigh loss  Insomnia. Plan: - discussed timing of melatonin dose - he is to continue xanax qhs >> explained that it would be slow process for him to wean off xanax if he wanted to try this - advised he could try alternative to xanax if he wanted to >> he would like to continue his current regimen for now  Shift work sleep schedule. Plan: - reviewed proper sleep hygiene - discussed importance of consolidating his sleep   Chesley Mires, MD [place] [facility] Pager:  [phone]

## 2014-11-14 NOTE — Patient Instructions (Signed)
Follow up in 1 year.

## 2014-11-16 ENCOUNTER — Other Ambulatory Visit: Payer: Self-pay | Admitting: Family Medicine

## 2014-11-16 NOTE — Telephone Encounter (Signed)
Xanax last filled 10/16/14, axiron last filled 04/06/14. Last seen 08/08/146, call both in to [facility] [phone]

## 2014-12-07 ENCOUNTER — Ambulatory Visit (INDEPENDENT_AMBULATORY_CARE_PROVIDER_SITE_OTHER): Payer: PRIVATE HEALTH INSURANCE | Admitting: Family Medicine

## 2014-12-07 ENCOUNTER — Ambulatory Visit (HOSPITAL_COMMUNITY)
Admission: RE | Admit: 2014-12-07 | Discharge: 2014-12-07 | Disposition: A | Discharge status: Home / Self Care | Payer: PRIVATE HEALTH INSURANCE | Source: Ambulatory Visit | Attending: Family Medicine | Admitting: Family Medicine

## 2014-12-07 ENCOUNTER — Encounter: Payer: Self-pay | Admitting: *Deleted

## 2014-12-07 ENCOUNTER — Encounter: Payer: Self-pay | Admitting: Family Medicine

## 2014-12-07 ENCOUNTER — Ambulatory Visit: Payer: PRIVATE HEALTH INSURANCE | Admitting: Sports Medicine

## 2014-12-07 VITALS — BP 112/85 | HR 66 | Temp 97.6°F | Ht 69.0 in | Wt 254.0 lb

## 2014-12-07 DIAGNOSIS — R569 Unspecified convulsions: Secondary | ICD-10-CM

## 2014-12-07 DIAGNOSIS — I10 Essential (primary) hypertension: Secondary | ICD-10-CM | POA: Diagnosis not present

## 2014-12-07 DIAGNOSIS — E291 Testicular hypofunction: Secondary | ICD-10-CM | POA: Diagnosis not present

## 2014-12-07 DIAGNOSIS — R55 Syncope and collapse: Secondary | ICD-10-CM | POA: Diagnosis not present

## 2014-12-07 DIAGNOSIS — R253 Fasciculation: Secondary | ICD-10-CM | POA: Insufficient documentation

## 2014-12-07 DIAGNOSIS — E349 Endocrine disorder, unspecified: Secondary | ICD-10-CM

## 2014-12-07 NOTE — Patient Instructions (Signed)
Since we are not exactly sure what happened with this questionable seizure activity, we would request that you do not drive and did not return to work until we can get additional x-rays and a visit with the neurologist We will arrange appropriate x-rays as soon as possible and a visit to the neurologist Once again, do not drive, do not climb

## 2014-12-07 NOTE — Progress Notes (Signed)
Subjective:    Patient ID: Roy Lawrence, male    DOB: 09-Dec-1970, 44 y.o.   MRN: [id number]  HPI Patient here today for an episode that occurred on Wednesday. He had a spell where he had what appeared to be a seizure and he passed out. His wife witnessed this and she is here with him today. The wife indicates that there was a twitching of the right side of the body and that he was nonresponsive for 10-15 seconds. She checked her blood pressure right after this happened it was 119/71 with a pulse rate of 58. He has seen the cardiologist in the past for what was thought was vasovagal episodes. And he had an echocardiogram and Holter monitor. He also had a recent visit to the pulmonologist and that report was also good. 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 His wife indicates that it seemed to take more time to him recover from the change and she have spent has in the past. He has a remote history of migraine headache in the past. He does play a lot of video games. This is on his phone. He denies any problem with his vision that has been a significant change recently. He also denies headache except after this event he had somewhat of a dull headache following the event. It took him several hours to recover from this witnessed episode by his wife who is a Marine scientist. He did not have any incontinence of bowel or stool. There is no previous history of any seizure activity. He also denies chest pain shortness of breath trouble swallowing heartburn indigestion nausea vomiting diarrhea or blood in the stool.      Patient Active Problem List   Diagnosis Date Noted  . OSA (obstructive sleep apnea) 04/20/2014  . Thrombocytopenia ([place]) 03/07/2014  . BPH (benign prostatic hyperplasia) 03/02/2014  . Hyperlipidemia 03/02/2014  . Cervical disc disorder with radiculopathy of cervical region 06/19/2013  . Vitamin D deficiency 06/19/2013  . History of diverticulitis of colon 12/08/2012  . Anxiety  11/09/2012  . History of asthma 11/09/2012  . Low back pain syndrome 11/09/2012  . Spondylolisthesis of lumbar region 11/09/2012  . Testosterone deficiency 05/09/2012  . Syncope 07/29/2011  . HTN (hypertension) 07/29/2011  . Overweight(278.02) 07/29/2011  . ESOPHAGEAL STRICTURE 01/10/2008  . DYSPHAGIA 01/10/2008  . GERD 01/09/2008  . PEPTIC STRICTURE 01/09/2008   Outpatient Encounter Prescriptions as of 12/07/2014  Medication Sig  . acetaminophen (TYLENOL) 325 MG tablet Take 650 mg by mouth every 6 (six) hours as needed for pain.  Marland Kitchen albuterol (VENTOLIN HFA) 108 (90 BASE) MCG/ACT inhaler Inhale 1 puff into the lungs every 6 (six) hours as needed for wheezing.  Marland Kitchen ALPRAZolam (XANAX) 0.5 MG tablet TAKE ONE TABLET BY MOUTH TWICE DAILY  . AXIRON 30 MG/ACT SOLN APPLY 2 APPLICATIONS TOPICALLY DAILY TO EACH ARM  . budesonide-formoterol (SYMBICORT) 160-4.5 MCG/ACT inhaler INHALE TWO PUFFS BY MOUTH TWICE DAILY, RINSE MOUTH AFTER USING  . Cholecalciferol (VITAMIN D3) 5000 UNITS CAPS Take 5,000 Units by mouth daily.   Marland Kitchen escitalopram (LEXAPRO) 20 MG tablet TAKE ONE-HALF TO ONE TABLET BY MOUTH ONCE DAILY  . esomeprazole (NEXIUM) 20 MG capsule TAKE ONE CAPSULE BY MOUTH TWICE DAILY BEFORE  A MEAL  . loratadine (CLARITIN) 10 MG tablet Take 10 mg by mouth daily.  . Magnesium Oxide (MAG-OX PO) Take 1 tablet by mouth daily.   . nebivolol (BYSTOLIC) 10 MG tablet Take 1 tablet (10 mg total)  by mouth 2 (two) times daily. (Patient taking differently: Take 10 mg by mouth daily. )  . Omega-3 Fatty Acids (FISH OIL) 1000 MG CPDR Take 2 tablets by mouth daily.  . traMADol (ULTRAM) 50 MG tablet Take 50 mg by mouth every 6 (six) hours as needed for pain.   . Melatonin 10 MG TABS Take 30 mg by mouth every evening.    No facility-administered encounter medications on file as of 12/07/2014.      Review of Systems  Constitutional: Negative.   HENT: Negative.   Eyes: Negative.   Respiratory: Negative.     Cardiovascular: Negative.   Gastrointestinal: Negative.   Endocrine: Negative.   Genitourinary: Negative.   Musculoskeletal: Negative.   Skin: Negative.   Allergic/Immunologic: Negative.   Neurological: Negative.   Hematological: Negative.   Psychiatric/Behavioral: Negative.        Objective:   Physical Exam  Constitutional: He is oriented to person, place, and time. He appears well-developed and well-nourished.  HENT:  Head: Normocephalic and atraumatic.  Right Ear: External ear normal.  Left Ear: External ear normal.  Nose: Nose normal.  Mouth/Throat: Oropharynx is clear and moist. No oropharyngeal exudate.  Eyes: Conjunctivae and EOM are normal. Pupils are equal, round, and reactive to light. Right eye exhibits no discharge. Left eye exhibits no discharge. No scleral icterus.  The disc were visualized and they were normal bilaterally.  Neck: Normal range of motion. Neck supple. No thyromegaly present.  No bruits or thyromegaly  Cardiovascular: Normal rate, regular rhythm and normal heart sounds.   No murmur heard. Pulmonary/Chest: Effort normal and breath sounds normal. No respiratory distress. He has no wheezes. He has no rales. He exhibits no tenderness.  Clear anteriorly and posteriorly  Abdominal: Soft. Bowel sounds are normal. He exhibits no mass. There is no tenderness. There is no rebound and no guarding.  Obesity without masses or tenderness  Musculoskeletal: Normal range of motion. He exhibits no edema.  Lymphadenopathy:    He has no cervical adenopathy.  Neurological: He is alert and oriented to person, place, and time. He has normal reflexes. No cranial nerve deficit.  Good strength and normal reflexes bilaterally.  Skin: Skin is warm and dry. No rash noted.  Psychiatric: He has a normal mood and affect. His behavior is normal. Judgment and thought content normal.  Nursing note and vitals reviewed.  BP 112/85 mmHg  Pulse 66  Temp(Src) 97.6 F (36.4 C)  (Oral)  Ht 5' 9" (1.753 m)  Wt 254 lb (115.214 kg)  BMI 37.49 kg/m2        Assessment & Plan:  1. Syncope, unspecified syncope type -Additional studies plus visit to neurology will be planned and patient will not drive or operate a motor vehicle or return to work until further evaluation is obtained - BMP8+EGFR - CBC with Differential/Platelet - Hepatic function panel - Ambulatory referral to Neurology - CT Head Wo Contrast; Future  2. Seizure-like activity (HCC) -As above - BMP8+EGFR - CBC with Differential/Platelet - Hepatic function panel - Ambulatory referral to Neurology - CT Head Wo Contrast; Future  3. Essential hypertension -Blood pressure is good  4. Testosterone deficiency -Recent testosterone levels were good and stable.  Patient Instructions  Since we are not exactly sure what happened with this questionable seizure activity, we would request that you do not drive and did not return to work until we can get additional x-rays and a visit with the neurologist We will arrange appropriate x-rays as   soon as possible and a visit to the neurologist Once again, do not drive, do not climb    Arrie Senate MD

## 2014-12-08 LAB — CBC WITH DIFFERENTIAL/PLATELET
Basophils Absolute: 0.1 10*3/uL (ref 0.0–0.2)
Basos: 1 %
EOS (ABSOLUTE): 0.2 10*3/uL (ref 0.0–0.4)
Eos: 4 %
Hematocrit: 45 % (ref 37.5–51.0)
Hemoglobin: 14.9 g/dL (ref 12.6–17.7)
Immature Grans (Abs): 0 10*3/uL (ref 0.0–0.1)
Immature Granulocytes: 0 %
Lymphocytes Absolute: 1.9 10*3/uL (ref 0.7–3.1)
Lymphs: 38 %
MCH: 28.5 pg (ref 26.6–33.0)
MCHC: 33.1 g/dL (ref 31.5–35.7)
MCV: 86 fL (ref 79–97)
Monocytes Absolute: 0.3 10*3/uL (ref 0.1–0.9)
Monocytes: 7 %
Neutrophils Absolute: 2.4 10*3/uL (ref 1.4–7.0)
Neutrophils: 50 %
Platelets: 126 10*3/uL — ABNORMAL LOW (ref 150–379)
RBC: 5.22 x10E6/uL (ref 4.14–5.80)
RDW: 13.6 % (ref 12.3–15.4)
WBC: 5 10*3/uL (ref 3.4–10.8)

## 2014-12-08 LAB — BMP8+EGFR
BUN/Creatinine Ratio: 19 (ref 9–20)
BUN: 20 mg/dL (ref 6–24)
CO2: 27 mmol/L (ref 18–29)
Calcium: 9.3 mg/dL (ref 8.7–10.2)
Chloride: 102 mmol/L (ref 97–106)
Creatinine, Ser: 1.07 mg/dL (ref 0.76–1.27)
GFR calc Af Amer: 96 mL/min/{1.73_m2} (ref >59)
GFR calc non Af Amer: 83 mL/min/{1.73_m2} (ref >59)
Glucose: 83 mg/dL (ref 65–99)
Potassium: 4.5 mmol/L (ref 3.5–5.2)
Sodium: 143 mmol/L (ref 136–144)

## 2014-12-08 LAB — HEPATIC FUNCTION PANEL
ALT: 82 IU/L — ABNORMAL HIGH (ref 0–44)
AST: 33 IU/L (ref 0–40)
Albumin: 4.4 g/dL (ref 3.5–5.5)
Alkaline Phosphatase: 54 IU/L (ref 39–117)
Bilirubin Total: 0.4 mg/dL (ref 0.0–1.2)
Bilirubin, Direct: 0.12 mg/dL (ref 0.00–0.40)
Total Protein: 6.2 g/dL (ref 6.0–8.5)

## 2014-12-11 ENCOUNTER — Encounter: Payer: Self-pay | Admitting: Neurology

## 2014-12-11 ENCOUNTER — Ambulatory Visit (INDEPENDENT_AMBULATORY_CARE_PROVIDER_SITE_OTHER): Payer: PRIVATE HEALTH INSURANCE | Admitting: Neurology

## 2014-12-11 VITALS — Ht 69.0 in | Wt 254.0 lb

## 2014-12-11 DIAGNOSIS — R55 Syncope and collapse: Secondary | ICD-10-CM | POA: Diagnosis not present

## 2014-12-11 NOTE — Patient Instructions (Addendum)
We will check an EEG and a 2D echo.   Syncope Syncope is a medical term for fainting or passing out. This means you lose consciousness and drop to the ground. People are generally unconscious for less than 5 minutes. You may have some muscle twitches for up to 15 seconds before waking up and returning to normal. Syncope occurs more often in older adults, but it can happen to anyone. While most causes of syncope are not dangerous, syncope can be a sign of a serious medical problem. It is important to seek medical care.  CAUSES  Syncope is caused by a sudden drop in blood flow to the brain. The specific cause is often not determined. Factors that can bring on syncope include:  Taking medicines that lower blood pressure.  Sudden changes in posture, such as standing up quickly.  Taking more medicine than prescribed.  Standing in one place for too long.  Seizure disorders.  Dehydration and excessive exposure to heat.  Low blood sugar (hypoglycemia).  Straining to have a bowel movement.  Heart disease, irregular heartbeat, or other circulatory problems.  Fear, emotional distress, seeing blood, or severe pain. SYMPTOMS  Right before fainting, you may:  Feel dizzy or light-headed.  Feel nauseous.  See all white or all black in your field of vision.  Have cold, clammy skin. DIAGNOSIS  Your health care provider will ask about your symptoms, perform a physical exam, and perform an electrocardiogram (ECG) to record the electrical activity of your heart. Your health care provider may also perform other heart or blood tests to determine the cause of your syncope which may include:  Transthoracic echocardiogram (TTE). During echocardiography, sound waves are used to evaluate how blood flows through your heart.  Transesophageal echocardiogram (TEE).  Cardiac monitoring. This allows your health care provider to monitor your heart rate and rhythm in real time.  Holter monitor. This is a  portable device that records your heartbeat and can help diagnose heart arrhythmias. It allows your health care provider to track your heart activity for several days, if needed.  Stress tests by exercise or by giving medicine that makes the heart beat faster. TREATMENT  In most cases, no treatment is needed. Depending on the cause of your syncope, your health care provider may recommend changing or stopping some of your medicines. HOME CARE INSTRUCTIONS  Have someone stay with you until you feel stable.  Do not drive, use machinery, or play sports until your health care provider says it is okay.  Keep all follow-up appointments as directed by your health care provider.  Lie down right away if you start feeling like you might faint. Breathe deeply and steadily. Wait until all the symptoms have passed.  Drink enough fluids to keep your urine clear or pale yellow.  If you are taking blood pressure or heart medicine, get up slowly and take several minutes to sit and then stand. This can reduce dizziness. SEEK IMMEDIATE MEDICAL CARE IF:   You have a severe headache.  You have unusual pain in the chest, abdomen, or back.  You are bleeding from your mouth or rectum, or you have black or tarry stool.  You have an irregular or very fast heartbeat.  You have pain with breathing.  You have repeated fainting or seizure-like jerking during an episode.  You faint when sitting or lying down.  You have confusion.  You have trouble walking.  You have severe weakness.  You have vision problems. If you fainted,  call your local emergency services (911 in [place] Do not drive yourself to the hospital.    This information is not intended to replace advice given to you by your health care provider. Make sure you discuss any questions you have with your health care provider.   Document Released: 01/19/2005 Document Revised: 06/05/2014 Document Reviewed: 03/20/2011 Elsevier Interactive Patient  Education [employer].

## 2014-12-11 NOTE — Progress Notes (Signed)
Reason for visit: Syncope  Referring physician: Dr. [name] is a 44 y.o. male  History of present illness:  Roy Lawrence is a 44 year old right-handed white male with a history of syncope in the past. The patient has a history of asthma, obesity, and sleep apnea on CPAP. He indicates that over the last 10 years he has had 15 to 20  prior events, the vast majority of the events have been associated with episodes of vigorous laughing. The patient was at home on 12/05/2014, he began laughing with vigor, and suddenly he began to feel dizzy. The patient lost conscious for only a few seconds, certainly less than 15 seconds. His wife noted that he would lean to the right, and had some smooth ataxic movements of the right arm. The patient came to very rapidly, he appeared to be blanched in the face, and diaphoretic. The patient did not feel well, and he lay down afterwards, and he felt drained and wiped out. The patient reported no focal numbness or weakness of the face, arms, or legs. He does have some sensation of numbness on a chronic basis on the left side of the face. He does report daily headaches while working during the day. He denies any issues controlling the bowels or the bladder, and no significant balance issues. He did not bite his tongue, or lose control of the bowels or bladder with the blackout episode. The patient underwent a CT scan of the brain that is unremarkable, no evidence of Arnold-Chiari malformation is noted. He comes to this office for an evaluation. Previously, he has had a 2-D echocardiogram and a cardiac evaluation, but this has been greater than 7 years ago.  Past Medical History  Diagnosis Date  . Asthma   . Hypertension     Prehypertension  . Spondylosis   . Heart murmur   . GERD (gastroesophageal reflux disease)   . Esophageal stricture   . Diverticulitis   . Anxiety   . Depression   . Diverticulosis   . Obesity   . Allergy     SEASONAL  .  Anxiety and depression     Past Surgical History  Procedure Laterality Date  . Shoulder surgery Right     x 2  . Finger surgery Right     3rd digit; imbedded glass  . Esophageal dilation      Family History  Problem Relation Age of Onset  . Hyperlipidemia Mother   . Hypertension Mother   . GER disease Mother   . Diverticulitis Mother     partial colectomy due to this  . Asthma Mother   . Hodgkin's lymphoma Father   . Diabetes Father   . Thyroid disease Father   . Graves' disease Father   . Colon cancer Neg Hx   . Thyroid disease Sister   . Dementia Maternal Grandmother   . Hypertension Maternal Grandmother   . Dementia Maternal Grandfather   . Hypertension Maternal Grandfather     Social history:  reports that he has never smoked. He has never used smokeless tobacco. He reports that he drinks alcohol. He reports that he does not use illicit drugs.  Medications:  Prior to Admission medications   Medication Sig Start Date End Date Taking? Authorizing Provider  b complex vitamins tablet Take 1 tablet by mouth daily.   Yes Historical Provider, MD  diphenhydrAMINE (BENADRYL) 25 mg capsule Take 25 mg by mouth every 6 (six) hours as needed.  Yes Historical Provider, MD  acetaminophen (TYLENOL) 325 MG tablet Take 650 mg by mouth every 6 (six) hours as needed for pain.    Historical Provider, MD  albuterol (VENTOLIN HFA) 108 (90 BASE) MCG/ACT inhaler Inhale 1 puff into the lungs every 6 (six) hours as needed for wheezing. 03/02/14   Chipper Herb, MD  ALPRAZolam Duanne Moron) 0.5 MG tablet TAKE ONE TABLET BY MOUTH TWICE DAILY 11/16/14   Chipper Herb, MD  AXIRON 30 MG/ACT SOLN APPLY 2 APPLICATIONS TOPICALLY DAILY TO [facility] ARM 11/16/14   Chipper Herb, MD  budesonide-formoterol [facility]) 160-4.5 MCG/ACT inhaler INHALE TWO PUFFS BY MOUTH TWICE DAILY, RINSE MOUTH AFTER USING 03/02/14   Chipper Herb, MD  Cholecalciferol (VITAMIN D3) 5000 UNITS CAPS Take 5,000 Units by mouth daily.      Historical Provider, MD  escitalopram (LEXAPRO) 20 MG tablet TAKE ONE-HALF TO ONE TABLET BY MOUTH ONCE DAILY 08/21/14   Chipper Herb, MD  esomeprazole (NEXIUM) 20 MG capsule TAKE ONE CAPSULE BY MOUTH TWICE DAILY BEFORE  A MEAL 09/21/14   Chipper Herb, MD  loratadine (CLARITIN) 10 MG tablet Take 10 mg by mouth daily.    Historical Provider, MD  Magnesium Oxide (MAG-OX PO) Take 1 tablet by mouth daily.     Historical Provider, MD  Melatonin 10 MG TABS Take 30 mg by mouth every evening.     Historical Provider, MD  nebivolol (BYSTOLIC) 10 MG tablet Take 1 tablet (10 mg total) by mouth 2 (two) times daily. Patient taking differently: Take 10 mg by mouth daily.  03/02/14   Chipper Herb, MD  Omega-3 Fatty Acids (FISH OIL) 1000 MG CPDR Take 2 tablets by mouth daily.    Historical Provider, MD  traMADol (ULTRAM) 50 MG tablet Take 50 mg by mouth every 6 (six) hours as needed for pain.     Historical Provider, MD      Allergies  Allergen Reactions  . Diovan [Valsartan] Swelling  . Quinapril Hcl Swelling    ROS:  Out of a complete 14 system review of symptoms, the patient complains only of the following symptoms, and all other reviewed systems are negative.  Fatigue Heart murmur Ringing in the ears, dizziness Shortness of breath, wheezing, snoring Feeling cold, increased thirst Joint pain Headache, numbness, dizziness, passing out Anxiety, not enough sleep, decreased energy, racing thoughts Insomnia, sleepiness, shift work, restless legs  Height 5\' 9"  (1.753 m), weight 254 lb (115.214 kg).  Physical Exam  General: The patient is alert and cooperative at the time of the examination. The patient is moderately to markedly obese.  Eyes: Pupils are equal, round, and reactive to light. Discs are flat bilaterally.  Neck: The neck is supple, no carotid bruits are noted.  Respiratory: The respiratory examination is clear.  Cardiovascular: The cardiovascular examination reveals a regular  rate and rhythm, no obvious murmurs or rubs are noted.  Skin: Extremities are without significant edema.  Neurologic Exam  Mental status: The patient is alert and oriented x 3 at the time of the examination. The patient has apparent normal recent and remote memory, with an apparently normal attention span and concentration ability.  Cranial nerves: Facial symmetry is present. There is good sensation of the face to pinprick and soft touch on the right, somewhat decreased on the left. The strength of the facial muscles and the muscles to head turning and shoulder shrug are normal bilaterally. Speech is well enunciated, no aphasia or dysarthria is noted.  Extraocular movements are full. Visual fields are full. The tongue is midline, and the patient has symmetric elevation of the soft palate. No obvious hearing deficits are noted.  Motor: The motor testing reveals 5 over 5 strength of all 4 extremities. Good symmetric motor tone is noted throughout.  Sensory: Sensory testing is intact to pinprick, soft touch, vibration sensation, and position sense on all 4 extremities. No evidence of extinction is noted.  Coordination: Cerebellar testing reveals good finger-nose-finger and heel-to-shin bilaterally.  Gait and station: Gait is normal. Tandem gait is normal. Romberg is negative. No drift is seen.  Reflexes: Deep tendon reflexes are symmetric and normal bilaterally. Toes are downgoing bilaterally.   Assessment/Plan:  1. Syncope  2. Sleep apnea on CPAP  3. Asthma  The patient has had multiple events of syncope in the past, usually associated with vigorous episodes of laughing. The patient also has some asthma, and history of sleep apnea. The patient is somewhat obese. These factors make him more likely to black out with laughter. This event reduces cardiac blood return to the heart with increased intrathoracic pressure. The mechanism is similar to tussive syncope. The most recent blackout event  appeared to have vasovagal qualities to the episode as well. The patient will be sent for an EEG study, and I will repeat the 2-D echocardiogram as this study has not been done in many years. 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  Jill Alexanders MD 12/11/2014 6:52 PM  [facility] [address] [place], [place] [address]  Phone [phone] Fax [phone]

## 2014-12-12 ENCOUNTER — Encounter: Payer: Self-pay | Admitting: Neurology

## 2014-12-12 ENCOUNTER — Telehealth: Payer: Self-pay

## 2014-12-12 NOTE — Telephone Encounter (Signed)
I will write a letter, this issue is addressed in the new patient evaluation as well.

## 2014-12-12 NOTE — Telephone Encounter (Signed)
Hilda Blades in Medical Records called patient to advise FMLA form completed, ready for pick up. Patient requesting letter stating when he is to return to work.

## 2014-12-13 DIAGNOSIS — Z0289 Encounter for other administrative examinations: Secondary | ICD-10-CM

## 2014-12-17 ENCOUNTER — Telehealth: Payer: Self-pay | Admitting: Neurology

## 2014-12-17 NOTE — Telephone Encounter (Signed)
Pt called inquiring where ECHO is to be done. Please call and advise

## 2014-12-17 NOTE — Telephone Encounter (Signed)
Called Roy Lawrence patient's apt Friday 12/21/2014. Arrive 7:30 . Spoke to patient he is aware of details of his apt.

## 2014-12-21 ENCOUNTER — Other Ambulatory Visit (HOSPITAL_COMMUNITY): Payer: PRIVATE HEALTH INSURANCE | Admitting: Cardiology

## 2014-12-21 ENCOUNTER — Telehealth: Payer: Self-pay | Admitting: Neurology

## 2014-12-21 ENCOUNTER — Ambulatory Visit (HOSPITAL_COMMUNITY): Payer: PRIVATE HEALTH INSURANCE | Attending: Neurology

## 2014-12-21 ENCOUNTER — Other Ambulatory Visit: Payer: Self-pay

## 2014-12-21 DIAGNOSIS — I340 Nonrheumatic mitral (valve) insufficiency: Secondary | ICD-10-CM | POA: Diagnosis not present

## 2014-12-21 DIAGNOSIS — R55 Syncope and collapse: Secondary | ICD-10-CM

## 2014-12-21 DIAGNOSIS — Z6837 Body mass index (BMI) 37.0-37.9, adult: Secondary | ICD-10-CM | POA: Insufficient documentation

## 2014-12-21 DIAGNOSIS — I071 Rheumatic tricuspid insufficiency: Secondary | ICD-10-CM | POA: Diagnosis not present

## 2014-12-21 DIAGNOSIS — I10 Essential (primary) hypertension: Secondary | ICD-10-CM | POA: Insufficient documentation

## 2014-12-21 DIAGNOSIS — I5189 Other ill-defined heart diseases: Secondary | ICD-10-CM | POA: Insufficient documentation

## 2014-12-21 DIAGNOSIS — E669 Obesity, unspecified: Secondary | ICD-10-CM | POA: Diagnosis not present

## 2014-12-21 MED ORDER — PERFLUTREN LIPID MICROSPHERE
1.0000 mL | INTRAVENOUS | Status: AC | PRN
  Administered 2014-12-21: 2 mL via INTRAVENOUS

## 2014-12-21 NOTE — Telephone Encounter (Signed)
I called the patient. The 2-D echo cardiogram was unremarkable. EEG study is pending.   2-D echocardiogram 12/21/14:  Study Conclusions  - Left ventricle: The cavity size was normal. Wall thickness was normal. Systolic function was normal. The estimated ejection fraction was in the range of 55% to 60%. Wall motion was normal; there were no regional wall motion abnormalities. Doppler parameters are consistent with abnormal left ventricular relaxation (grade 1 diastolic dysfunction).  Impressions:  - Technically difficult; definity used; normal LV systolic function; grade 1 diastolic dysfunction; trace MR and TR.

## 2014-12-31 ENCOUNTER — Ambulatory Visit (INDEPENDENT_AMBULATORY_CARE_PROVIDER_SITE_OTHER): Payer: PRIVATE HEALTH INSURANCE | Admitting: Neurology

## 2014-12-31 DIAGNOSIS — R55 Syncope and collapse: Secondary | ICD-10-CM

## 2015-01-01 ENCOUNTER — Encounter: Payer: Self-pay | Admitting: Neurology

## 2015-01-01 ENCOUNTER — Telehealth: Payer: Self-pay | Admitting: Neurology

## 2015-01-01 NOTE — Telephone Encounter (Signed)
I called the patient. EEG study was unremarkable. He has also had an echocardiogram that was unremarkable. He has had a couple episodes over the Thanksgiving holiday where he started laughing, he started feeling dizzy, he was able to put his head down and prevent a blackout. The patient does not have seizures, likely has blackouts related to an increase in intrathoracic pressure associated with laughing. He is cleared to return to work in full capacity.  I will write a letter in this regard.

## 2015-01-01 NOTE — Procedures (Signed)
ELECTROENCEPHALOGRAM REPORT  Date of Study: 12/31/2014  Patient's Name: Roy Lawrence MRN: [id number] Date of Birth: [birth year]  Referring Provider: Dr. Margette Fast  Clinical History: This is a 44 year old man with recurrent episodes of loss of consciousness.  Medications: B complex vitamins, Bendryl. Tylenol, Ventolin HFA, Xanax, Symbicort, Axiron, Vitamin D3, Lexapro, Nexium, Claritin. Omega 3 fatty acids  Technical Summary: A multichannel digital EEG recording measured by the international 10-20 system with electrodes applied with paste and impedances below 5000 ohms performed in our laboratory with EKG monitoring in an awake and drowsy patient.  Hyperventilation and photic stimulation were performed.  The digital EEG was referentially recorded, reformatted, and digitally filtered in a variety of bipolar and referential montages for optimal display.    Description: The patient is awake and drowsy during the recording.  During maximal wakefulness, there is a symmetric, medium voltage 10.5 Hz posterior dominant rhythm that attenuates with eye opening.  The record is symmetric.  During drowsiness and sleep, there is an increase in theta slowing of the background. Deeper stages of sleep were not seen. Hyperventilation and photic stimulation did not elicit any abnormalities.  There were no epileptiform discharges or electrographic seizures seen.    EKG lead showed sinus bradycardia at 54 bpm.  Impression: This awake and drowsy EEG is normal.    Clinical Correlation: A normal EEG does not exclude a clinical diagnosis of epilepsy. Clinical correlation is advised.   Ellouise Newer, M.D.

## 2015-01-22 ENCOUNTER — Other Ambulatory Visit: Payer: Self-pay | Admitting: Family Medicine

## 2015-01-23 NOTE — Telephone Encounter (Signed)
Last seen 12/07/14  dwm  If approved route to nurse to call into [facility]  [phone]

## 2015-02-07 ENCOUNTER — Telehealth: Payer: Self-pay | Admitting: Family Medicine

## 2015-02-07 MED ORDER — ESOMEPRAZOLE MAGNESIUM 20 MG PO CPDR: DELAYED_RELEASE_CAPSULE | ORAL | Status: DC

## 2015-02-07 NOTE — Telephone Encounter (Signed)
Pt aware and rx re- submitted

## 2015-02-22 ENCOUNTER — Telehealth: Payer: Self-pay | Admitting: Family Medicine

## 2015-02-22 NOTE — Telephone Encounter (Signed)
Stp and he states he had some discomfort in his left testicle for about an hour today, no dysuria but does state that his urine flow has been weaker the past few days but this is common for him as he has had prostatitis and is being monitored by Dr. Laurance Flatten. Offered appt this afternoon with Dr.Bradshaw but pt declined due to work. Pt states he has an appt early Feb and will continue to monitor and if it occurs again he will CB to schedule an earlier appt.

## 2015-03-09 ENCOUNTER — Other Ambulatory Visit: Payer: Self-pay | Admitting: Family Medicine

## 2015-03-15 ENCOUNTER — Encounter: Payer: Self-pay | Admitting: Family Medicine

## 2015-03-15 ENCOUNTER — Ambulatory Visit (INDEPENDENT_AMBULATORY_CARE_PROVIDER_SITE_OTHER): Payer: PRIVATE HEALTH INSURANCE | Admitting: Family Medicine

## 2015-03-15 VITALS — BP 119/78 | HR 77 | Temp 97.3°F | Ht 69.0 in | Wt 255.0 lb

## 2015-03-15 DIAGNOSIS — E291 Testicular hypofunction: Secondary | ICD-10-CM | POA: Diagnosis not present

## 2015-03-15 DIAGNOSIS — R3912 Poor urinary stream: Secondary | ICD-10-CM | POA: Diagnosis not present

## 2015-03-15 DIAGNOSIS — M4316 Spondylolisthesis, lumbar region: Secondary | ICD-10-CM | POA: Diagnosis not present

## 2015-03-15 DIAGNOSIS — F419 Anxiety disorder, unspecified: Secondary | ICD-10-CM | POA: Diagnosis not present

## 2015-03-15 DIAGNOSIS — K219 Gastro-esophageal reflux disease without esophagitis: Secondary | ICD-10-CM | POA: Diagnosis not present

## 2015-03-15 DIAGNOSIS — D696 Thrombocytopenia, unspecified: Secondary | ICD-10-CM

## 2015-03-15 DIAGNOSIS — E349 Endocrine disorder, unspecified: Secondary | ICD-10-CM

## 2015-03-15 DIAGNOSIS — E559 Vitamin D deficiency, unspecified: Secondary | ICD-10-CM

## 2015-03-15 DIAGNOSIS — R0789 Other chest pain: Secondary | ICD-10-CM | POA: Diagnosis not present

## 2015-03-15 DIAGNOSIS — J9801 Acute bronchospasm: Secondary | ICD-10-CM

## 2015-03-15 DIAGNOSIS — I10 Essential (primary) hypertension: Secondary | ICD-10-CM | POA: Diagnosis not present

## 2015-03-15 DIAGNOSIS — N400 Enlarged prostate without lower urinary tract symptoms: Secondary | ICD-10-CM

## 2015-03-15 DIAGNOSIS — E785 Hyperlipidemia, unspecified: Secondary | ICD-10-CM | POA: Diagnosis not present

## 2015-03-15 LAB — POCT UA - MICROSCOPIC ONLY
Bacteria, U Microscopic: NEGATIVE
Casts, Ur, LPF, POC: NEGATIVE
Crystals, Ur, HPF, POC: NEGATIVE
Epithelial cells, urine per micros: NEGATIVE
RBC, urine, microscopic: NEGATIVE
WBC, Ur, HPF, POC: NEGATIVE
Yeast, UA: NEGATIVE

## 2015-03-15 LAB — POCT URINALYSIS DIPSTICK
Bilirubin, UA: NEGATIVE
Blood, UA: NEGATIVE
Glucose, UA: NEGATIVE
Ketones, UA: NEGATIVE
Leukocytes, UA: NEGATIVE
Nitrite, UA: NEGATIVE
Spec Grav, UA: >=1.03
Urobilinogen, UA: NEGATIVE
pH, UA: 6

## 2015-03-15 MED ORDER — ALPRAZOLAM 0.5 MG PO TABS: 0.5000 mg | ORAL_TABLET | Freq: Two times a day (BID) | ORAL | Status: DC

## 2015-03-15 NOTE — Progress Notes (Signed)
Subjective:    Patient ID: Roy Lawrence, male    DOB: 1970/12/01, 45 y.o.   MRN: [id number]  HPI Pt here for follow up and management of chronic medical problems which includes hypertension and hyperlipidemia. He is taking medications regularly. The patient today is complaining with a week urine stream and feels like he is not emptying completely. He also has some pain in the testicle area. He does complain of some congestion. He is requesting a refill on his Xanax. He is due for rectal exam today because he is taking testosterone. He is also due to return an FOBT card. He will get lab work today in addition to a urinalysis. He had a chest x-ray in December at [facility] and we will get a copy of this for his records. The patient had a GI virus over Christmas but is better from this. This is when he had a chest x-ray. He does have some chest discomfort but attributes this to gas because of belching relieves this. He also has some pressure at times and also attributes this to his breathing and asthma and is using his inhalers a little bit more frequently than usual. Does have a fan in the bedroom even though he uses his CPAP. He denies any trouble with his hernia but does take his Nexium twice daily and sometimes comes in addition to this. He is not seeing any blood in the stool or had any black tarry bowel movements. He notices that his drinking is slower than usual. He denies any burning. He also complains of some discomfort in his left testicle especially if he coughs or lifts something and questions whether there may be a bulge in that area or not. He is up-to-date on his eye exams. He does indicate he had a colonoscopy about 2 years ago from Dr. Henrene Pastor.      Patient Active Problem List   Diagnosis Date Noted  . OSA (obstructive sleep apnea) 04/20/2014  . Thrombocytopenia ([place]) 03/07/2014  . BPH (benign prostatic hyperplasia) 03/02/2014  . Hyperlipidemia 03/02/2014  . Cervical disc  disorder with radiculopathy of cervical region 06/19/2013  . Vitamin D deficiency 06/19/2013  . History of diverticulitis of colon 12/08/2012  . Anxiety 11/09/2012  . History of asthma 11/09/2012  . Low back pain syndrome 11/09/2012  . Spondylolisthesis of lumbar region 11/09/2012  . Testosterone deficiency 05/09/2012  . Syncope 07/29/2011  . HTN (hypertension) 07/29/2011  . Overweight(278.02) 07/29/2011  . ESOPHAGEAL STRICTURE 01/10/2008  . DYSPHAGIA 01/10/2008  . GERD 01/09/2008  . PEPTIC STRICTURE 01/09/2008   Outpatient Encounter Prescriptions as of 03/15/2015  Medication Sig  . acetaminophen (TYLENOL) 325 MG tablet Take 650 mg by mouth every 6 (six) hours as needed for pain.  Marland Kitchen albuterol (VENTOLIN HFA) 108 (90 BASE) MCG/ACT inhaler Inhale 1 puff into the lungs every 6 (six) hours as needed for wheezing.  Marland Kitchen ALPRAZolam (XANAX) 0.5 MG tablet TAKE ONE TABLET BY MOUTH TWICE DAILY  . AXIRON 30 MG/ACT SOLN APPLY 2 APPLICATIONS TOPICALLY DAILY TO EACH ARM  . b complex vitamins tablet Take 1 tablet by mouth daily.  . budesonide-formoterol (SYMBICORT) 160-4.5 MCG/ACT inhaler INHALE TWO PUFFS BY MOUTH TWICE DAILY, RINSE MOUTH AFTER USING  . BYSTOLIC 10 MG tablet TAKE ONE TABLET BY MOUTH TWICE DAILY  . Cholecalciferol (VITAMIN D3) 5000 UNITS CAPS Take 5,000 Units by mouth daily.   . diphenhydrAMINE (BENADRYL) 25 mg capsule Take 25 mg by mouth every 6 (six) hours as needed.  Marland Kitchen  escitalopram (LEXAPRO) 20 MG tablet TAKE ONE-HALF TO ONE TABLET BY MOUTH ONCE DAILY  . esomeprazole (NEXIUM) 20 MG capsule TAKE ONE CAPSULE BY MOUTH TWICE DAILY.OTC  . loratadine (CLARITIN) 10 MG tablet Take 10 mg by mouth daily.  . Magnesium Oxide (MAG-OX PO) Take 1 tablet by mouth daily.   . Melatonin 10 MG TABS Take 30 mg by mouth every evening.   Marland Kitchen omega-3 acid ethyl esters (LOVAZA) 1 g capsule TAKE ONE CAPSULE BY MOUTH TWICE DAILY  . Omega-3 Fatty Acids (FISH OIL) 1000 MG CPDR Take 2 tablets by mouth daily.  .  traMADol (ULTRAM) 50 MG tablet Take 50 mg by mouth every 6 (six) hours as needed for pain.    No facility-administered encounter medications on file as of 03/15/2015.      Review of Systems  Constitutional: Negative.   HENT: Positive for congestion.   Eyes: Negative.   Respiratory: Negative.   Cardiovascular: Negative.   Gastrointestinal: Negative.   Endocrine: Negative.   Genitourinary: Positive for difficulty urinating (weak stream, not empting) and testicular pain (pressure).  Musculoskeletal: Negative.   Skin: Negative.   Allergic/Immunologic: Negative.   Hematological: Negative.   Psychiatric/Behavioral: Negative.        Objective:   Physical Exam  Constitutional: He is oriented to person, place, and time. He appears well-developed and well-nourished. No distress.  HENT:  Head: Normocephalic and atraumatic.  Right Ear: External ear normal.  Left Ear: External ear normal.  Nose: Nose normal.  Mouth/Throat: Oropharynx is clear and moist. No oropharyngeal exudate.  Eyes: Conjunctivae and EOM are normal. Pupils are equal, round, and reactive to light. Right eye exhibits no discharge. Left eye exhibits no discharge. No scleral icterus.  Neck: Normal range of motion. Neck supple. No thyromegaly present.  No bruits thyromegaly or anterior cervical adenopathy  Cardiovascular: Normal rate, regular rhythm, normal heart sounds and intact distal pulses.   No murmur heard. The heart has a regular rate and rhythm at 60/m  Pulmonary/Chest: Effort normal and breath sounds normal. No respiratory distress. He has no wheezes. He has no rales. He exhibits no tenderness.  The lungs are clear anteriorly and posteriorly but he does have a slightly irritated dry cough.  Abdominal: Soft. Bowel sounds are normal. He exhibits no mass. There is no tenderness. There is no rebound and no guarding.  There was slight epigastric tenderness. The patient is obese and was reminded that he needs to continue  to work on his weight.  Genitourinary: Rectum normal and penis normal.  The prostate gland was smooth and slightly enlarged. There were no rectal masses. No inguinal hernias were palpable. No inguinal nodes were palpable. The external genitalia appeared to be within normal limits.  Musculoskeletal: Normal range of motion. He exhibits no edema or tenderness.  He has good mobility without pain.  Lymphadenopathy:    He has no cervical adenopathy.  Neurological: He is alert and oriented to person, place, and time. He has normal reflexes. No cranial nerve deficit.  Skin: Skin is warm and dry. No rash noted.  Psychiatric: He has a normal mood and affect. His behavior is normal. Judgment and thought content normal.  Nursing note and vitals reviewed.  BP 119/78 mmHg  Pulse 77  Temp(Src) 97.3 F (36.3 C) (Oral)  Ht 5' 9"  (1.753 m)  Wt 255 lb (115.667 kg)  BMI 37.64 kg/m2        Assessment & Plan:  1. Vitamin D deficiency -Continue current vitamin D  replacement pending results of lab work - CBC with Differential/Platelet - VITAMIN D 25 Hydroxy (Vit-D Deficiency, Fractures)  2. Essential hypertension -The blood pressure is good today the patient will continue with current treatment - BMP8+EGFR - CBC with Differential/Platelet - Hepatic function panel  3. Gastroesophageal reflux disease, esophagitis presence not specified -He will continue with his Nexium twice daily and when necessary use of times her Zantac - CBC with Differential/Platelet - Hepatic function panel  4. Hyperlipidemia -Continue current treatment pending results of lab work - CBC with Differential/Platelet - NMR, lipoprofile  5. Testosterone deficiency -The patient denies fatigue or sexual dysfunction and he will continue with his testosterone replacement pending results of lab work - CBC with Differential/Platelet - PSA, total and free - Testosterone,Free and Total  6. Weak urinary stream -We will check a  urinalysis today and make sure that there is no infection playing a role with his weak stream. - POCT urinalysis dipstick - POCT UA - Microscopic Only - Urine culture - PSA, total and free - Testosterone,Free and Total  7. Thrombocytopenia ([facility]) -No signs of any bleeding or blood loss.  8. Spondylolisthesis of lumbar region -This appears to be stable with this patient.  9. BPH (benign prostatic hyperplasia) -The prostate is slightly enlarged without lumps or masses and we will check a PSA today and we have been every 6 months as long as he is been using the Taylor testosterone replacement  10. Anxiety -Continue with the Xanax as needed  11. Bronchospasm -Continue with inhalers as doing  Meds ordered this encounter  Medications  . ALPRAZolam (XANAX) 0.5 MG tablet    Sig: Take 1 tablet (0.5 mg total) by mouth 2 (two) times daily.    Dispense:  60 tablet    Refill:  5   Patient Instructions                       Medicare Annual Wellness Visit  [facility] and the medical providers at [facility] strive to bring you the best medical care.  In doing so we not only want to address your current medical conditions and concerns but also to detect new conditions early and prevent illness, disease and health-related problems.    Medicare offers a yearly Wellness Visit which allows our clinical staff to assess your need for preventative services including immunizations, lifestyle education, counseling to decrease risk of preventable diseases and screening for fall risk and other medical concerns.    This visit is provided free of charge (no copay) for all Medicare recipients. The clinical pharmacists at [facility] have begun to conduct these Wellness Visits which will also include a thorough review of all your medications.    As you primary medical provider recommend that you make an appointment for your Annual Wellness Visit if you have not  done so already this year.  You may set up this appointment before you leave today or you may call back ([phone]) and schedule an appointment.  Please make sure when you call that you mention that you are scheduling your Annual Wellness Visit with the clinical pharmacist so that the appointment may be made for the proper length of time.     Continue current medications. Continue good therapeutic lifestyle changes which include good diet and exercise. Fall precautions discussed with patient. If an FOBT was given today- please return it to our front desk. If you are over 1 years old -  you may need Prevnar 13 or the adult Pneumonia vaccine.  **Flu shots are available--- please call and schedule a FLU-CLINIC appointment**  After your visit with [place] today you will receive a survey in the mail or online from [employer] regarding your care with [place]. Please take a moment to fill this out. Your feedback is very important to [place] as you can help [place] better understand your patient needs as well as improve your experience and satisfaction. WE CARE ABOUT YOU!!!   The patient should discontinue the use of the overhead fan He should keep the house as cool as possible and drink plenty of fluids and stay well hydrated He should use a cool mist humidifier in the bedroom at nighttime and a sound machine if necessary He should continue to use his inhalers and even during the day drink plenty of water and fluids We will call with the lab work results as soon as they become available and recommend treatment recommendations after review reviewing those. He should return the FOBT The patient was reminded today that in the future we will only be able to give either pain medicine or anxiety medicine but not both and he understands the reasons for this.   Arrie Senate MD

## 2015-03-15 NOTE — Patient Instructions (Addendum)
Medicare Annual Wellness Visit  [facility] and the medical providers at [facility] strive to bring you the best medical care.  In doing so we not only want to address your current medical conditions and concerns but also to detect new conditions early and prevent illness, disease and health-related problems.    Medicare offers a yearly Wellness Visit which allows our clinical staff to assess your need for preventative services including immunizations, lifestyle education, counseling to decrease risk of preventable diseases and screening for fall risk and other medical concerns.    This visit is provided free of charge (no copay) for all Medicare recipients. The clinical pharmacists at [facility] have begun to conduct these Wellness Visits which will also include a thorough review of all your medications.    As you primary medical provider recommend that you make an appointment for your Annual Wellness Visit if you have not done so already this year.  You may set up this appointment before you leave today or you may call back [phone] and schedule an appointment.  Please make sure when you call that you mention that you are scheduling your Annual Wellness Visit with the clinical pharmacist so that the appointment may be made for the proper length of time.     Continue current medications. Continue good therapeutic lifestyle changes which include good diet and exercise. Fall precautions discussed with patient. If an FOBT was given today- please return it to our front desk. If you are over 54 years old - you may need Prevnar 43 or the adult Pneumonia vaccine.  **Flu shots are available--- please call and schedule a FLU-CLINIC appointment**  After your visit with [place] today you will receive a survey in the mail or online from [employer] regarding your care with [place]. Please take a moment to fill this out. Your feedback is very  important to [place] as you can help [place] better understand your patient needs as well as improve your experience and satisfaction. WE CARE ABOUT YOU!!!   The patient should discontinue the use of the overhead fan He should keep the house as cool as possible and drink plenty of fluids and stay well hydrated He should use a cool mist humidifier in the bedroom at nighttime and a sound machine if necessary He should continue to use his inhalers and even during the day drink plenty of water and fluids We will call with the lab work results as soon as they become available and recommend treatment recommendations after review reviewing those. He should return the FOBT The patient was reminded today that in the future we will only be able to give either pain medicine or anxiety medicine but not both and he understands the reasons for this.

## 2015-03-15 NOTE — Addendum Note (Signed)
Addended by: Zannie Cove on: 03/15/2015 09:52 AM   Modules accepted: Orders

## 2015-03-16 LAB — CBC WITH DIFFERENTIAL/PLATELET
Basophils Absolute: 0 10*3/uL (ref 0.0–0.2)
Basos: 1 %
EOS (ABSOLUTE): 0.2 10*3/uL (ref 0.0–0.4)
Eos: 3 %
Hematocrit: 43 % (ref 37.5–51.0)
Hemoglobin: 14.4 g/dL (ref 12.6–17.7)
Immature Grans (Abs): 0 10*3/uL (ref 0.0–0.1)
Immature Granulocytes: 0 %
Lymphocytes Absolute: 1.5 10*3/uL (ref 0.7–3.1)
Lymphs: 30 %
MCH: 28.5 pg (ref 26.6–33.0)
MCHC: 33.5 g/dL (ref 31.5–35.7)
MCV: 85 fL (ref 79–97)
Monocytes Absolute: 0.4 10*3/uL (ref 0.1–0.9)
Monocytes: 8 %
Neutrophils Absolute: 2.9 10*3/uL (ref 1.4–7.0)
Neutrophils: 58 %
Platelets: 173 10*3/uL (ref 150–379)
RBC: 5.06 x10E6/uL (ref 4.14–5.80)
RDW: 13 % (ref 12.3–15.4)
WBC: 5 10*3/uL (ref 3.4–10.8)

## 2015-03-16 LAB — BMP8+EGFR
BUN/Creatinine Ratio: 18 (ref 9–20)
BUN: 22 mg/dL (ref 6–24)
CO2: 24 mmol/L (ref 18–29)
Calcium: 8.9 mg/dL (ref 8.7–10.2)
Chloride: 102 mmol/L (ref 96–106)
Creatinine, Ser: 1.19 mg/dL (ref 0.76–1.27)
GFR calc Af Amer: 84 mL/min/{1.73_m2} (ref >59)
GFR calc non Af Amer: 73 mL/min/{1.73_m2} (ref >59)
Glucose: 108 mg/dL — ABNORMAL HIGH (ref 65–99)
Potassium: 4.3 mmol/L (ref 3.5–5.2)
Sodium: 143 mmol/L (ref 134–144)

## 2015-03-16 LAB — PSA, TOTAL AND FREE
PSA, Free Pct: 26.7 %
PSA, Free: 0.32 ng/mL
Prostate Specific Ag, Serum: 1.2 ng/mL (ref 0.0–4.0)

## 2015-03-16 LAB — URINE CULTURE

## 2015-03-16 LAB — HEPATIC FUNCTION PANEL
ALT: 54 IU/L — ABNORMAL HIGH (ref 0–44)
AST: 24 IU/L (ref 0–40)
Albumin: 4.4 g/dL (ref 3.5–5.5)
Alkaline Phosphatase: 67 IU/L (ref 39–117)
Bilirubin Total: 0.3 mg/dL (ref 0.0–1.2)
Bilirubin, Direct: 0.11 mg/dL (ref 0.00–0.40)
Total Protein: 6.5 g/dL (ref 6.0–8.5)

## 2015-03-16 LAB — NMR, LIPOPROFILE
Cholesterol: 148 mg/dL (ref 100–199)
HDL Cholesterol by NMR: 34 mg/dL — ABNORMAL LOW (ref >39)
HDL Particle Number: 28.9 umol/L — ABNORMAL LOW (ref >=30.5)
LDL Particle Number: 1139 nmol/L — ABNORMAL HIGH (ref <1000)
LDL Size: 20.8 nm (ref >20.5)
LDL-C: 84 mg/dL (ref 0–99)
LP-IR Score: 75 — ABNORMAL HIGH (ref <=45)
Small LDL Particle Number: 368 nmol/L (ref <=527)
Triglycerides by NMR: 150 mg/dL — ABNORMAL HIGH (ref 0–149)

## 2015-03-16 LAB — TESTOSTERONE,FREE AND TOTAL
Testosterone, Free: 5 pg/mL — ABNORMAL LOW (ref 6.8–21.5)
Testosterone: 161 ng/dL — ABNORMAL LOW (ref 348–1197)

## 2015-03-16 LAB — VITAMIN D 25 HYDROXY (VIT D DEFICIENCY, FRACTURES): Vit D, 25-Hydroxy: 31.5 ng/mL (ref 30.0–100.0)

## 2015-03-25 ENCOUNTER — Telehealth: Payer: Self-pay | Admitting: Family Medicine

## 2015-03-25 NOTE — Telephone Encounter (Signed)
New Message  This message is to inform you that we have made 3 consecutive attempts to contact the patient. We have also mailed a letter to the patient to inform them to call in and schedule. Although we were unsuccessful in these attempts we wanted you to be aware of our efforts. Will remove the patient from our work queue at this time.     [facility]

## 2015-03-26 NOTE — Telephone Encounter (Signed)
Please try to get in touch with this patient regarding these attempts to schedule a visit for him

## 2015-03-26 NOTE — Telephone Encounter (Signed)
Left message for patient to call back to schedule an appt

## 2015-03-29 ENCOUNTER — Encounter: Payer: Self-pay | Admitting: *Deleted

## 2015-05-07 NOTE — Progress Notes (Signed)
Cardiology Office Note   Date:  05/08/2015   ID:  Roy Lawrence, DOB November 15, 1970, MRN [id number]  PCP:  Redge Gainer, MD  Cardiologist:   Minus Breeding, MD   Chief Complaint  Patient presents with  . Chest Pain      History of Present Illness: Roy Lawrence is a 45 y.o. male who presents for evaluation of chest pain. He has had no prior cardiac history. He has had syncope. This has been evaluated without a cardiac etiology being identified. Recently he has had a neurologic workup without clear etiology. I did see an echocardiogram in 2016 which was normal. I last saw him in 2013. He has been diagnosed with vasovagal syncope that happens actually when he laughs heart.  He does not have a history of coronary artery disease. However, he did have some chest discomfort couple of months ago. This was left upper chest. Was an isolated spot that did not radiate to his jaw or to his arms. He has not been able to bring out on with activity. He hasn't had any of this in several weeks. He does not describe associated symptoms. He does get short of breath with these doing exerting physical activity but this has been chronic and he blames on weight and deconditioning. 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  Past Medical History  Diagnosis Date  . Asthma   . Hypertension     Prehypertension  . Spondylosis   . GERD (gastroesophageal reflux disease)   . Esophageal stricture   . Diverticulitis   . Anxiety   . Depression   . Diverticulosis   . Obesity   . Allergy     SEASONAL    Past Surgical History  Procedure Laterality Date  . Shoulder surgery Right     x 2  . Finger surgery Right     3rd digit; imbedded glass  . Esophageal dilation       Current Outpatient Prescriptions  Medication Sig Dispense Refill  . acetaminophen (TYLENOL) 325 MG tablet Take 650 mg by mouth every 6 (six) hours as needed for pain.    Marland Kitchen albuterol (VENTOLIN HFA) 108 (90 BASE) MCG/ACT inhaler  Inhale 1 puff into the lungs every 6 (six) hours as needed for wheezing. 18 each 3  . ALPRAZolam (XANAX) 0.5 MG tablet Take 1 tablet (0.5 mg total) by mouth 2 (two) times daily. 60 tablet 5  . AXIRON 30 MG/ACT SOLN APPLY 2 APPLICATIONS TOPICALLY DAILY TO EACH ARM 180 mL 1  . b complex vitamins tablet Take 1 tablet by mouth daily.    . budesonide-formoterol (SYMBICORT) 160-4.5 MCG/ACT inhaler INHALE TWO PUFFS BY MOUTH TWICE DAILY, RINSE MOUTH AFTER USING 2 Inhaler 11  . BYSTOLIC 10 MG tablet TAKE ONE TABLET BY MOUTH TWICE DAILY 180 tablet 0  . Cholecalciferol (VITAMIN D3) 5000 UNITS CAPS Take 5,000 Units by mouth daily.     . diphenhydrAMINE (BENADRYL) 25 mg capsule Take 25 mg by mouth every 6 (six) hours as needed.    Marland Kitchen escitalopram (LEXAPRO) 20 MG tablet TAKE ONE-HALF TO ONE TABLET BY MOUTH ONCE DAILY 90 tablet 0  . esomeprazole (NEXIUM) 20 MG capsule TAKE ONE CAPSULE BY MOUTH TWICE DAILY.OTC 60 capsule 5  . loratadine (CLARITIN) 10 MG tablet Take 10 mg by mouth daily.    . Magnesium Oxide (MAG-OX PO) Take 1 tablet by mouth daily.     . Melatonin 10 MG TABS Take 30 mg  by mouth every evening.     . meloxicam (MOBIC) 15 MG tablet Take 15 mg by mouth daily.    Marland Kitchen omega-3 acid ethyl esters (LOVAZA) 1 g capsule TAKE ONE CAPSULE BY MOUTH TWICE DAILY 60 capsule 2  . traMADol (ULTRAM) 50 MG tablet Take 50 mg by mouth every 6 (six) hours as needed for pain.      No current facility-administered medications for this visit.    Allergies:   Diovan and Quinapril hcl    Social History:  The patient  reports that he has never smoked. He has never used smokeless tobacco. He reports that he drinks alcohol. He reports that he does not use illicit drugs.   Family History:  The patient's family history includes Asthma in his mother; Dementia in his maternal grandfather and maternal grandmother; Diabetes in his father; Diverticulitis in his mother; GER disease in his mother; Berenice Primas' disease in his father;  Hodgkin's lymphoma in his father; Hyperlipidemia in his mother; Hypertension in his maternal grandfather, maternal grandmother, and mother; Thyroid disease in his father and sister. There is no history of Colon cancer.    ROS:  Please see the history of present illness.   Otherwise, review of systems are positive for Reflux.   All other systems are reviewed and negative.    PHYSICAL EXAM: VS:  BP 130/80 mmHg  Pulse 56  Ht 5\' 9"  (1.753 m)  Wt 262 lb (118.842 kg)  BMI 38.67 kg/m2 , BMI Body mass index is 38.67 kg/(m^2). GENERAL:  Well appearing HEENT:  Pupils equal round and reactive, fundi not visualized, oral mucosa unremarkable NECK:  No jugular venous distention, waveform within normal limits, carotid upstroke brisk and symmetric, no bruits, no thyromegaly LYMPHATICS:  No cervical, inguinal adenopathy LUNGS:  Clear to auscultation bilaterally BACK:  No CVA tenderness CHEST:  Unremarkable HEART:  PMI not displaced or sustained,S1 and S2 within normal limits, no S3, no S4, no clicks, no rubs, no murmurs ABD:  Flat, positive bowel sounds normal in frequency in pitch, no bruits, no rebound, no guarding, no midline pulsatile mass, no hepatomegaly, no splenomegaly EXT:  2 plus pulses throughout, no edema, no cyanosis no clubbing SKIN:  No rashes no nodules NEURO:  Cranial nerves II through XII grossly intact, motor grossly intact throughout PSYCH:  Cognitively intact, oriented to person place and time    EKG:  EKG is ordered today. The ekg ordered today demonstrates sinus rhythm, rate 59, axis within normal limits, intervals within normal limits, no acute ST.   Recent Labs: 03/15/2015: ALT 54*; BUN 22; Creatinine, Ser 1.19; Platelets 173; Potassium 4.3; Sodium 143    Lipid Panel    Component Value Date/Time   CHOL 148 03/15/2015 0846   CHOL 132 05/04/2012 1053   TRIG 150* 03/15/2015 0846   TRIG 111 05/04/2012 1053   HDL 34* 03/15/2015 0846   HDL 38* 05/04/2012 1053   LDLCALC  95 06/19/2013 1053   LDLCALC 72 05/04/2012 1053      Wt Readings from Last 3 Encounters:  05/08/15 262 lb (118.842 kg)  03/15/15 255 lb (115.667 kg)  12/11/14 254 lb (115.214 kg)      Other studies Reviewed: Additional studies/ records that were reviewed today include: Echo 2016. Review of the above records demonstrates:  Please see elsewhere in the note.     ASSESSMENT AND PLAN:  CHEST PAIN:  This is atypical. I will bring the patient back for a POET (Plain Old Exercise Test). This  will allow me to screen for obstructive coronary disease, risk stratify and very importantly provide a prescription for exercise.  Of note I will have the patient take his beta blocker during the study so it might be somewhat difficult to get his heart rate increased.  OBESITY:  The patient understands the need to lose weight with diet and exercise. We have discussed specific strategies for this.    Current medicines are reviewed at length with the patient today.  The patient does not have concerns regarding medicines.  The following changes have been made:  no change  Labs/ tests ordered today include:   Orders Placed This Encounter  Procedures  . Exercise Tolerance Test  . EKG 12-Lead     Disposition:   FU with me as needed.      Signed, Minus Breeding, MD  05/08/2015 6:23 PM    [facility]

## 2015-05-08 ENCOUNTER — Encounter: Payer: Self-pay | Admitting: Cardiology

## 2015-05-08 ENCOUNTER — Ambulatory Visit (INDEPENDENT_AMBULATORY_CARE_PROVIDER_SITE_OTHER): Payer: PRIVATE HEALTH INSURANCE | Admitting: Cardiology

## 2015-05-08 VITALS — BP 130/80 | HR 56 | Ht 69.0 in | Wt 262.0 lb

## 2015-05-08 DIAGNOSIS — R079 Chest pain, unspecified: Secondary | ICD-10-CM

## 2015-05-08 NOTE — Patient Instructions (Signed)
Medication Instructions:  The current medical regimen is effective;  continue present plan and medications.  Testing/Procedures: Your physician has requested that you have an exercise tolerance test. For further information please visit [email or web address]. Please also follow instruction sheet, as given.  Follow-Up: Follow up as needed after testing.  If you need a refill on your cardiac medications before your next appointment, please call your pharmacy.  Thank you for choosing [facility]!!     

## 2015-05-31 ENCOUNTER — Ambulatory Visit (INDEPENDENT_AMBULATORY_CARE_PROVIDER_SITE_OTHER): Payer: PRIVATE HEALTH INSURANCE

## 2015-05-31 DIAGNOSIS — R079 Chest pain, unspecified: Secondary | ICD-10-CM

## 2015-06-03 LAB — EXERCISE TOLERANCE TEST
Estimated workload: 11.5 METS
Exercise duration (min): 9 min
Exercise duration (sec): 51 s
MPHR: 173 {beats}/min
Peak HR: 141 {beats}/min
Percent HR: 81 %
RPE: 17
Rest HR: 64 {beats}/min

## 2015-06-12 ENCOUNTER — Other Ambulatory Visit: Payer: Self-pay | Admitting: Family Medicine

## 2015-08-13 ENCOUNTER — Other Ambulatory Visit: Payer: Self-pay | Admitting: *Deleted

## 2015-08-13 MED ORDER — ESOMEPRAZOLE MAGNESIUM 20 MG PO CPDR: DELAYED_RELEASE_CAPSULE | ORAL | Status: DC

## 2015-08-20 ENCOUNTER — Encounter: Payer: Self-pay | Admitting: Family Medicine

## 2015-08-26 ENCOUNTER — Other Ambulatory Visit: Payer: Self-pay | Admitting: Family Medicine

## 2015-09-13 ENCOUNTER — Ambulatory Visit (INDEPENDENT_AMBULATORY_CARE_PROVIDER_SITE_OTHER): Payer: PRIVATE HEALTH INSURANCE

## 2015-09-13 ENCOUNTER — Ambulatory Visit (INDEPENDENT_AMBULATORY_CARE_PROVIDER_SITE_OTHER): Payer: PRIVATE HEALTH INSURANCE | Admitting: Family Medicine

## 2015-09-13 ENCOUNTER — Encounter: Payer: Self-pay | Admitting: Family Medicine

## 2015-09-13 VITALS — BP 111/67 | HR 65 | Temp 97.3°F | Ht 69.0 in | Wt 251.0 lb

## 2015-09-13 DIAGNOSIS — K219 Gastro-esophageal reflux disease without esophagitis: Secondary | ICD-10-CM

## 2015-09-13 DIAGNOSIS — M4316 Spondylolisthesis, lumbar region: Secondary | ICD-10-CM

## 2015-09-13 DIAGNOSIS — N400 Enlarged prostate without lower urinary tract symptoms: Secondary | ICD-10-CM

## 2015-09-13 DIAGNOSIS — E785 Hyperlipidemia, unspecified: Secondary | ICD-10-CM

## 2015-09-13 DIAGNOSIS — Z0000 Encounter for general adult medical examination without abnormal findings: Secondary | ICD-10-CM

## 2015-09-13 DIAGNOSIS — E559 Vitamin D deficiency, unspecified: Secondary | ICD-10-CM

## 2015-09-13 DIAGNOSIS — I10 Essential (primary) hypertension: Secondary | ICD-10-CM

## 2015-09-13 DIAGNOSIS — E349 Endocrine disorder, unspecified: Secondary | ICD-10-CM

## 2015-09-13 DIAGNOSIS — D696 Thrombocytopenia, unspecified: Secondary | ICD-10-CM

## 2015-09-13 LAB — URINALYSIS, COMPLETE
Bilirubin, UA: NEGATIVE
Glucose, UA: NEGATIVE
Ketones, UA: NEGATIVE
Leukocytes, UA: NEGATIVE
Nitrite, UA: NEGATIVE
Protein, UA: NEGATIVE
RBC, UA: NEGATIVE
Specific Gravity, UA: 1.025 (ref 1.005–1.030)
Urobilinogen, Ur: 0.2 mg/dL (ref 0.2–1.0)
pH, UA: 6 (ref 5.0–7.5)

## 2015-09-13 LAB — MICROSCOPIC EXAMINATION
Epithelial Cells (non renal): NONE SEEN /hpf (ref 0–10)
RBC, UA: NONE SEEN /hpf (ref 0–?)
WBC, UA: NONE SEEN /hpf (ref 0–?)

## 2015-09-13 MED ORDER — ESOMEPRAZOLE MAGNESIUM 20 MG PO CPDR: DELAYED_RELEASE_CAPSULE | ORAL | 11 refills | Status: DC

## 2015-09-13 NOTE — Patient Instructions (Addendum)
Continue current medications. Continue good therapeutic lifestyle changes which include good diet and exercise. Fall precautions discussed with patient. If an FOBT was given today- please return it to our front desk. If you are over 47 years old - you may need Prevnar 43 or the adult Pneumonia vaccine.   After your visit with [place] today you will receive a survey in the mail or online from [employer] regarding your care with [place]. Please take a moment to fill this out. Your feedback is very important to [place] as you can help [place] better understand your patient needs as well as improve your experience and satisfaction. WE CARE ABOUT YOU!!!   The patient should continue with aggressive therapeutic lifestyle changes and most importantly with all efforts to lose more weight and drink more water and fluids  He should continue with current treatment He should follow-up with the neurosurgeon as needed Try to switch over to ranitidine from Nexium over the next 2-3 months You can purchase ranitidine, the equate brand over-the-counter at [facility] 4 $7 or less.

## 2015-09-13 NOTE — Progress Notes (Signed)
 Subjective:    Patient ID: Roy Lawrence, male    DOB: 08/13/1970, 45 y.o.   MRN: [id number]  HPI Patient is here today for annual wellness exam and follow up of chronic medical problems which includes hyperlipidemia and hypertension. He is taking medications regularly.The patient is doing well with no particular complaints. He has testosterone deficiency. 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 He is due to get a chest x-ray today and a rectal exam today. He is asking for refills on his Nexium. He has a history of chronic low back pain hyperlipidemia BPH hypertension and testosterone deficiency. 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 He is going to retire from his job in the spring. He and his wife have been separated now for several months and he is upset about this but says he can't control this. He denies any chest pain shortness of breath. His asthma has been under good control. He denies any problems with heartburn indigestion nausea vomiting diarrhea or blood in the stool. He's passing his water without problems. His eye exams are up-to-date. His low back pain and spondylolisthesis are stable. He follows up with the neurosurgeon on an as needed basis for this. The patient did have a recent stress test by his cardiologist and everything was good with this.   Patient Active Problem List   Diagnosis Date Noted  . OSA (obstructive sleep apnea) 04/20/2014  . Thrombocytopenia (HCC) 03/07/2014  . BPH (benign prostatic hyperplasia) 03/02/2014  . Hyperlipidemia 03/02/2014  . Cervical disc disorder with radiculopathy of cervical region 06/19/2013  . Vitamin D deficiency 06/19/2013  . History of diverticulitis of colon 12/08/2012  . Anxiety 11/09/2012  . History of asthma 11/09/2012  . Low back pain syndrome 11/09/2012  . Spondylolisthesis of lumbar region 11/09/2012  . Testosterone  deficiency 05/09/2012  . Syncope 07/29/2011  . HTN (hypertension) 07/29/2011  . Overweight(278.02) 07/29/2011  . ESOPHAGEAL STRICTURE 01/10/2008  . DYSPHAGIA 01/10/2008  . GERD 01/09/2008  . PEPTIC STRICTURE 01/09/2008   Outpatient Encounter Prescriptions as of 09/13/2015  Medication Sig  . acetaminophen (TYLENOL) 325 MG tablet Take 650 mg by mouth every 6 (six) hours as needed for pain.  . albuterol (VENTOLIN HFA) 108 (90 BASE) MCG/ACT inhaler Inhale 1 puff into the lungs every 6 (six) hours as needed for wheezing.  . ALPRAZolam (XANAX) 0.5 MG tablet Take 1 tablet (0.5 mg total) by mouth 2 (two) times daily.  . AXIRON 30 MG/ACT SOLN APPLY TWO APPLICATIONS OF SOLUTION TOPICALLY ONCE DAILY TO EACH ARM  . b complex vitamins tablet Take 1 tablet by mouth daily.  . budesonide-formoterol (SYMBICORT) 160-4.5 MCG/ACT inhaler INHALE TWO PUFFS BY MOUTH TWICE DAILY, RINSE MOUTH AFTER USING  . BYSTOLIC 10 MG tablet TAKE ONE TABLET BY MOUTH TWICE DAILY  . Cholecalciferol (VITAMIN D3) 5000 UNITS CAPS Take 5,000 Units by mouth daily.   . diphenhydrAMINE (BENADRYL) 25 mg capsule Take 25 mg by mouth every 6 (six) hours as needed.  . escitalopram (LEXAPRO) 20 MG tablet TAKE ONE-HALF TO ONE TABLET BY MOUTH ONCE DAILY  . esomeprazole (NEXIUM) 20 MG capsule TAKE ONE CAPSULE BY MOUTH TWICE DAILY.OTC  . loratadine (CLARITIN) 10 MG tablet Take 10 mg by mouth daily.  . Magnesium Oxide (MAG-OX PO) Take 1 tablet by mouth daily.   . Melatonin 10 MG TABS Take 30 mg by mouth   every evening.   . meloxicam (MOBIC) 15 MG tablet Take 15 mg by mouth daily.  Marland Kitchen omega-3 acid ethyl esters (LOVAZA) 1 g capsule TAKE ONE CAPSULE BY MOUTH TWICE DAILY  . traMADol (ULTRAM) 50 MG tablet Take 50 mg by mouth every 6 (six) hours as needed for pain.    No facility-administered encounter medications on file as of 09/13/2015.      Review of Systems  Constitutional: Negative.   HENT: Negative.   Eyes: Negative.   Respiratory:  Negative.   Cardiovascular: Negative.   Gastrointestinal: Negative.   Endocrine: Negative.   Genitourinary: Negative.   Musculoskeletal: Negative.   Skin: Negative.   Allergic/Immunologic: Negative.   Neurological: Negative.   Hematological: Negative.   Psychiatric/Behavioral: Negative.        Objective:   Physical Exam  Constitutional: He is oriented to person, place, and time. He appears well-developed and well-nourished.  HENT:  Head: Normocephalic and atraumatic.  Right Ear: External ear normal.  Left Ear: External ear normal.  Nose: Nose normal.  Mouth/Throat: Oropharynx is clear and moist. No oropharyngeal exudate.  Eyes: Conjunctivae and EOM are normal. Pupils are equal, round, and reactive to light. Right eye exhibits no discharge. Left eye exhibits no discharge. No scleral icterus.  Neck: Normal range of motion. Neck supple. No thyromegaly present.  No bruits thyromegaly or anterior cervical adenopathy  Cardiovascular: Normal rate, regular rhythm, normal heart sounds and intact distal pulses.   No murmur heard. The heart is regular at 72/m  Pulmonary/Chest: Effort normal and breath sounds normal. No respiratory distress. He has no wheezes. He has no rales. He exhibits no tenderness.  No axillary adenopathy no chest wall masses and lungs are clear anteriorly and posteriorly  Abdominal: Soft. Bowel sounds are normal. He exhibits no mass. There is no tenderness. There is no rebound and no guarding.  The abdomen is obese without masses tenderness or organ enlargement or inguinal adenopathy  Genitourinary: Rectum normal and penis normal.  Genitourinary Comments: The patient is on testosterone therapy for low testosterone. The prostate is smooth and slightly enlarged without lumps or masses. There are no rectal masses. There are no inguinal hernias palpable. External genitalia were within normal limits.  Musculoskeletal: Normal range of motion. He exhibits no edema.  The  patient had good mobility with no signs of any back pain limiting his range of motion.  Lymphadenopathy:    He has no cervical adenopathy.  Neurological: He is alert and oriented to person, place, and time. He has normal reflexes. No cranial nerve deficit.  Skin: Skin is warm and dry. No rash noted.  Psychiatric: He has a normal mood and affect. His behavior is normal. Judgment and thought content normal.  Nursing note and vitals reviewed.  BP 111/67 (BP Location: Right Arm)   Pulse 65   Temp 97.3 F (36.3 C) (Oral)   Ht 5' 9" (1.753 m)   Wt 251 lb (113.9 kg)   BMI 37.07 kg/m         Assessment & Plan:  1. Essential hypertension -Continue to work on losing weight and watch sodium intake - BMP8+EGFR - CBC with Differential/Platelet - Hepatic function panel - DG Chest 2 View; Future  2. Hyperlipidemia -Continue current treatment and aggressive therapeutic lifestyle changes and weight loss pending results of lab work - CBC with Differential/Platelet - NMR, lipoprofile  3. Testosterone deficiency -Continue Axiron pending results of lab work - Urinalysis, Complete - CBC with Differential/Platelet - PSA, total  and free - Testosterone,Free and Total  4. Annual physical exam -Return the FOBT and we will call with lab work results and chest x-ray results as soon as they become available - Urinalysis, Complete - BMP8+EGFR - CBC with Differential/Platelet - Hepatic function panel - PSA, total and free - NMR, lipoprofile - Testosterone,Free and Total - VITAMIN D 25 Hydroxy (Vit-D Deficiency, Fractures) - DG Chest 2 View; Future  5. Gastroesophageal reflux disease, esophagitis presence not specified -This is stable with Nexium but patient is going to try to gradually reduce the Nexium and change over to ranitidine and we explained during the visit how to do this and he had an understanding of this. - CBC with Differential/Platelet  6. Vitamin D deficiency -Continue  current treatment pending results of lab work - CBC with Differential/Platelet - VITAMIN D 25 Hydroxy (Vit-D Deficiency, Fractures)  7. Thrombocytopenia ([place]) -No history of any bleeding issues.  8. Spondylolisthesis of lumbar region -The back pain is stable but he has the neurosurgeon that he can call if he has increasing problems with the spondylolisthesis  9. BPH (benign prostatic hyperplasia) -Currently, this is causing no symptoms.  No orders of the defined types were placed in this encounter.  Patient Instructions  Continue current medications. Continue good therapeutic lifestyle changes which include good diet and exercise. Fall precautions discussed with patient. If an FOBT was given today- please return it to our front desk. If you are over 32 years old - you may need Prevnar 45 or the adult Pneumonia vaccine.   After your visit with [place] today you will receive a survey in the mail or online from [employer] regarding your care with [place]. Please take a moment to fill this out. Your feedback is very important to [place] as you can help [place] better understand your patient needs as well as improve your experience and satisfaction. WE CARE ABOUT YOU!!!   The patient should continue with aggressive therapeutic lifestyle changes and most importantly with all efforts to lose more weight and drink more water and fluids  He should continue with current treatment He should follow-up with the neurosurgeon as needed Try to switch over to ranitidine from Nexium over the next 2-3 months You can purchase ranitidine, the equate brand over-the-counter at [facility] 4 $7 or less.    Arrie Senate MD   .

## 2015-09-13 NOTE — Addendum Note (Signed)
Addended by: Zannie Cove on: 09/13/2015 09:40 AM   Modules accepted: Orders

## 2015-09-15 LAB — CBC WITH DIFFERENTIAL/PLATELET
Basophils Absolute: 0 10*3/uL (ref 0.0–0.2)
Basos: 1 %
EOS (ABSOLUTE): 0.1 10*3/uL (ref 0.0–0.4)
Eos: 2 %
Hematocrit: 44.9 % (ref 37.5–51.0)
Hemoglobin: 14.5 g/dL (ref 12.6–17.7)
Immature Grans (Abs): 0 10*3/uL (ref 0.0–0.1)
Immature Granulocytes: 0 %
Lymphocytes Absolute: 1.7 10*3/uL (ref 0.7–3.1)
Lymphs: 33 %
MCH: 28.3 pg (ref 26.6–33.0)
MCHC: 32.3 g/dL (ref 31.5–35.7)
MCV: 88 fL (ref 79–97)
Monocytes Absolute: 0.3 10*3/uL (ref 0.1–0.9)
Monocytes: 5 %
Neutrophils Absolute: 3 10*3/uL (ref 1.4–7.0)
Neutrophils: 59 %
Platelets: 132 10*3/uL — ABNORMAL LOW (ref 150–379)
RBC: 5.12 x10E6/uL (ref 4.14–5.80)
RDW: 13 % (ref 12.3–15.4)
WBC: 5.1 10*3/uL (ref 3.4–10.8)

## 2015-09-15 LAB — BMP8+EGFR
BUN/Creatinine Ratio: 18 (ref 9–20)
BUN: 23 mg/dL (ref 6–24)
CO2: 26 mmol/L (ref 18–29)
Calcium: 9.1 mg/dL (ref 8.7–10.2)
Chloride: 102 mmol/L (ref 96–106)
Creatinine, Ser: 1.27 mg/dL (ref 0.76–1.27)
GFR calc Af Amer: 77 mL/min/{1.73_m2} (ref >59)
GFR calc non Af Amer: 67 mL/min/{1.73_m2} (ref >59)
Glucose: 99 mg/dL (ref 65–99)
Potassium: 4.7 mmol/L (ref 3.5–5.2)
Sodium: 142 mmol/L (ref 134–144)

## 2015-09-15 LAB — NMR, LIPOPROFILE
Cholesterol: 169 mg/dL (ref 100–199)
HDL Cholesterol by NMR: 34 mg/dL — ABNORMAL LOW (ref >39)
HDL Particle Number: 28.3 umol/L — ABNORMAL LOW (ref >=30.5)
LDL Particle Number: 1232 nmol/L — ABNORMAL HIGH (ref <1000)
LDL Size: 20.8 nm (ref >20.5)
LDL-C: 101 mg/dL — ABNORMAL HIGH (ref 0–99)
LP-IR Score: 76 — ABNORMAL HIGH (ref <=45)
Small LDL Particle Number: 367 nmol/L (ref <=527)
Triglycerides by NMR: 170 mg/dL — ABNORMAL HIGH (ref 0–149)

## 2015-09-15 LAB — HEPATIC FUNCTION PANEL
ALT: 31 IU/L (ref 0–44)
AST: 23 IU/L (ref 0–40)
Albumin: 4.3 g/dL (ref 3.5–5.5)
Alkaline Phosphatase: 54 IU/L (ref 39–117)
Bilirubin Total: 0.4 mg/dL (ref 0.0–1.2)
Bilirubin, Direct: 0.13 mg/dL (ref 0.00–0.40)
Total Protein: 6.3 g/dL (ref 6.0–8.5)

## 2015-09-15 LAB — VITAMIN D 25 HYDROXY (VIT D DEFICIENCY, FRACTURES): Vit D, 25-Hydroxy: 37.6 ng/mL (ref 30.0–100.0)

## 2015-09-15 LAB — TESTOSTERONE,FREE AND TOTAL
Testosterone, Free: 27.1 pg/mL — ABNORMAL HIGH (ref 6.8–21.5)
Testosterone: 1073 ng/dL — ABNORMAL HIGH (ref 264–916)

## 2015-09-15 LAB — PSA, TOTAL AND FREE
PSA, Free Pct: 30 %
PSA, Free: 0.36 ng/mL
Prostate Specific Ag, Serum: 1.2 ng/mL (ref 0.0–4.0)

## 2015-09-19 ENCOUNTER — Ambulatory Visit (HOSPITAL_COMMUNITY)
Admission: EM | Admit: 2015-09-19 | Discharge: 2015-09-19 | Disposition: A | Discharge status: Home / Self Care | Payer: Worker's Compensation | Attending: Family Medicine | Admitting: Family Medicine

## 2015-09-19 ENCOUNTER — Encounter (HOSPITAL_COMMUNITY): Payer: Self-pay | Admitting: Emergency Medicine

## 2015-09-19 DIAGNOSIS — R0789 Other chest pain: Secondary | ICD-10-CM | POA: Diagnosis not present

## 2015-09-19 DIAGNOSIS — F419 Anxiety disorder, unspecified: Secondary | ICD-10-CM | POA: Diagnosis not present

## 2015-09-19 NOTE — ED Triage Notes (Signed)
Patient reports a stress event today.  Afterwards experienced chest tightness and nausea.  Patient continues to have these complaints.  Patient denies sob.  Skin warm and dry.

## 2015-09-19 NOTE — Discharge Instructions (Signed)
For increased chest pain, worsening heaviness, tightness, fullness or pressure, breaking out in a cold sweat, shortness of breath, vomiting, weakness or other symptoms called 911.
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
## 2015-09-30 ENCOUNTER — Other Ambulatory Visit: Payer: Self-pay | Admitting: Family Medicine

## 2015-10-01 ENCOUNTER — Other Ambulatory Visit: Payer: Self-pay | Admitting: *Deleted

## 2015-10-01 NOTE — Telephone Encounter (Signed)
Alprazolam last filled 08/26/15. Last seen 09/13/15. If approved please route to pool for nurse to call into pharmacy

## 2016-01-03 ENCOUNTER — Encounter: Payer: Self-pay | Admitting: Pulmonary Disease

## 2016-01-03 ENCOUNTER — Ambulatory Visit (INDEPENDENT_AMBULATORY_CARE_PROVIDER_SITE_OTHER): Payer: PRIVATE HEALTH INSURANCE | Admitting: Pulmonary Disease

## 2016-01-03 VITALS — BP 122/64 | HR 65 | Ht 69.0 in | Wt 260.0 lb

## 2016-01-03 DIAGNOSIS — F99 Mental disorder, not otherwise specified: Secondary | ICD-10-CM

## 2016-01-03 DIAGNOSIS — Z9989 Dependence on other enabling machines and devices: Secondary | ICD-10-CM | POA: Diagnosis not present

## 2016-01-03 DIAGNOSIS — G4733 Obstructive sleep apnea (adult) (pediatric): Secondary | ICD-10-CM

## 2016-01-03 DIAGNOSIS — G4726 Circadian rhythm sleep disorder, shift work type: Secondary | ICD-10-CM | POA: Diagnosis not present

## 2016-01-03 DIAGNOSIS — F5105 Insomnia due to other mental disorder: Secondary | ICD-10-CM
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
## 2016-01-03 NOTE — Patient Instructions (Signed)
Follow up in 1 year.

## 2016-01-21 ENCOUNTER — Other Ambulatory Visit: Payer: Self-pay | Admitting: Family Medicine

## 2016-03-13 ENCOUNTER — Encounter: Payer: Self-pay | Admitting: Family Medicine

## 2016-03-13 ENCOUNTER — Ambulatory Visit (INDEPENDENT_AMBULATORY_CARE_PROVIDER_SITE_OTHER): Payer: PRIVATE HEALTH INSURANCE | Admitting: Family Medicine

## 2016-03-13 VITALS — BP 115/66 | HR 61 | Temp 97.1°F | Ht 69.0 in | Wt 245.0 lb

## 2016-03-13 DIAGNOSIS — D696 Thrombocytopenia, unspecified: Secondary | ICD-10-CM

## 2016-03-13 DIAGNOSIS — Z113 Encounter for screening for infections with a predominantly sexual mode of transmission: Secondary | ICD-10-CM | POA: Diagnosis not present

## 2016-03-13 DIAGNOSIS — E349 Endocrine disorder, unspecified: Secondary | ICD-10-CM | POA: Diagnosis not present

## 2016-03-13 DIAGNOSIS — I10 Essential (primary) hypertension: Secondary | ICD-10-CM

## 2016-03-13 DIAGNOSIS — E7800 Pure hypercholesterolemia, unspecified: Secondary | ICD-10-CM | POA: Diagnosis not present

## 2016-03-13 DIAGNOSIS — F439 Reaction to severe stress, unspecified: Secondary | ICD-10-CM

## 2016-03-13 DIAGNOSIS — K219 Gastro-esophageal reflux disease without esophagitis: Secondary | ICD-10-CM

## 2016-03-13 DIAGNOSIS — E559 Vitamin D deficiency, unspecified: Secondary | ICD-10-CM

## 2016-03-13 LAB — URINALYSIS
Bilirubin, UA: NEGATIVE
Glucose, UA: NEGATIVE
Ketones, UA: NEGATIVE
Leukocytes, UA: NEGATIVE
Nitrite, UA: NEGATIVE
Protein, UA: NEGATIVE
RBC, UA: NEGATIVE
Specific Gravity, UA: 1.02 (ref 1.005–1.030)
Urobilinogen, Ur: 0.2 mg/dL (ref 0.2–1.0)
pH, UA: 7 (ref 5.0–7.5)

## 2016-03-13 MED ORDER — MELOXICAM 15 MG PO TABS: 15.0000 mg | ORAL_TABLET | Freq: Every day | ORAL | 1 refills | Status: DC

## 2016-03-13 MED ORDER — OMEGA-3-ACID ETHYL ESTERS 1 G PO CAPS: 1.0000 | ORAL_CAPSULE | Freq: Two times a day (BID) | ORAL | 11 refills | Status: DC

## 2016-03-13 MED ORDER — NEBIVOLOL HCL 10 MG PO TABS
10.0000 mg | ORAL_TABLET | Freq: Two times a day (BID) | ORAL | 11 refills | Status: DC
Start: 2016-03-13 — End: 2017-04-12

## 2016-03-13 MED ORDER — ALBUTEROL SULFATE HFA 108 (90 BASE) MCG/ACT IN AERS: 1.0000 | INHALATION_SPRAY | Freq: Four times a day (QID) | RESPIRATORY_TRACT | 3 refills | Status: DC | PRN

## 2016-03-13 MED ORDER — BUDESONIDE-FORMOTEROL FUMARATE 160-4.5 MCG/ACT IN AERO: INHALATION_SPRAY | RESPIRATORY_TRACT | 11 refills | Status: DC

## 2016-03-13 MED ORDER — ESCITALOPRAM OXALATE 20 MG PO TABS: ORAL_TABLET | ORAL | 3 refills | Status: DC

## 2016-03-13 MED ORDER — ALPRAZOLAM 0.5 MG PO TABS: 0.5000 mg | ORAL_TABLET | Freq: Two times a day (BID) | ORAL | 5 refills | Status: DC

## 2016-03-13 MED ORDER — TESTOSTERONE 30 MG/ACT TD SOLN: TRANSDERMAL | 5 refills | Status: DC
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
## 2016-03-13 NOTE — Patient Instructions (Addendum)
Continue current medications. Continue good therapeutic lifestyle changes which include good diet and exercise. Fall precautions discussed with patient. If an FOBT was given today- please return it to our front desk. If you are over 48 years old - you may need Prevnar 78 or the adult Pneumonia vaccine.  **Flu shots are available--- please call and schedule a FLU-CLINIC appointment**  After your visit with [place] today you will receive a survey in the mail or online from [employer] regarding your care with [place]. Please take a moment to fill this out. Your feedback is very important to [place] as you can help [place] better understand your patient needs as well as improve your experience and satisfaction. WE CARE ABOUT YOU!!!   Continue to drink plenty of fluids and stay well hydrated Keep the house as cool as possible and use cool mist humidification if needed Since the spring season is her worst time with your asthma you may want to start your long acting bronchodilator the end of the month and certainly before March 1. You want to use this regularly for 2 or 3 months. Avoid irritating environments in practice good hand and pulmonary hygiene. Continue to attend church and this will help you get through this stressful time in her life

## 2016-03-15 LAB — CBC WITH DIFFERENTIAL/PLATELET
Basophils Absolute: 0.1 10*3/uL (ref 0.0–0.2)
Basos: 1 %
EOS (ABSOLUTE): 0.1 10*3/uL (ref 0.0–0.4)
Eos: 2 %
Hematocrit: 42.4 % (ref 37.5–51.0)
Hemoglobin: 13.9 g/dL (ref 13.0–17.7)
Immature Grans (Abs): 0 10*3/uL (ref 0.0–0.1)
Immature Granulocytes: 0 %
Lymphocytes Absolute: 1.8 10*3/uL (ref 0.7–3.1)
Lymphs: 34 %
MCH: 25.9 pg — ABNORMAL LOW (ref 26.6–33.0)
MCHC: 32.8 g/dL (ref 31.5–35.7)
MCV: 79 fL (ref 79–97)
Monocytes Absolute: 0.3 10*3/uL (ref 0.1–0.9)
Monocytes: 6 %
Neutrophils Absolute: 3 10*3/uL (ref 1.4–7.0)
Neutrophils: 57 %
Platelets: 133 10*3/uL — ABNORMAL LOW (ref 150–379)
RBC: 5.36 x10E6/uL (ref 4.14–5.80)
RDW: 13.9 % (ref 12.3–15.4)
WBC: 5.3 10*3/uL (ref 3.4–10.8)

## 2016-03-15 LAB — BMP8+EGFR
BUN/Creatinine Ratio: 17 (ref 9–20)
BUN: 20 mg/dL (ref 6–24)
CO2: 25 mmol/L (ref 18–29)
Calcium: 9.4 mg/dL (ref 8.7–10.2)
Chloride: 100 mmol/L (ref 96–106)
Creatinine, Ser: 1.21 mg/dL (ref 0.76–1.27)
GFR calc Af Amer: 82 mL/min/{1.73_m2} (ref >59)
GFR calc non Af Amer: 71 mL/min/{1.73_m2} (ref >59)
Glucose: 82 mg/dL (ref 65–99)
Potassium: 4.5 mmol/L (ref 3.5–5.2)
Sodium: 142 mmol/L (ref 134–144)

## 2016-03-15 LAB — HEPATIC FUNCTION PANEL
ALT: 28 IU/L (ref 0–44)
AST: 21 IU/L (ref 0–40)
Albumin: 4.7 g/dL (ref 3.5–5.5)
Alkaline Phosphatase: 59 IU/L (ref 39–117)
Bilirubin Total: <0.2 mg/dL (ref 0.0–1.2)
Bilirubin, Direct: 0.07 mg/dL (ref 0.00–0.40)
Total Protein: 6.8 g/dL (ref 6.0–8.5)

## 2016-03-15 LAB — TESTOSTERONE,FREE AND TOTAL
Testosterone, Free: 29.3 pg/mL — ABNORMAL HIGH (ref 6.8–21.5)
Testosterone: 1021 ng/dL — ABNORMAL HIGH (ref 264–916)

## 2016-03-15 LAB — LIPID PANEL
Chol/HDL Ratio: 4.7 ratio units (ref 0.0–5.0)
Cholesterol, Total: 184 mg/dL (ref 100–199)
HDL: 39 mg/dL — ABNORMAL LOW (ref >39)
LDL Calculated: 118 mg/dL — ABNORMAL HIGH (ref 0–99)
Triglycerides: 136 mg/dL (ref 0–149)
VLDL Cholesterol Cal: 27 mg/dL (ref 5–40)

## 2016-03-15 LAB — STD SCREEN (8)
HIV Screen 4th Generation wRfx: NONREACTIVE
HSV 1 Glycoprotein G Ab, IgG: <0.91 index (ref 0.00–0.90)
HSV 2 Glycoprotein G Ab, IgG: <0.91 index (ref 0.00–0.90)
Hep A IgM: NEGATIVE
Hep B C IgM: NEGATIVE
Hep C Virus Ab: <0.1 s/co ratio (ref 0.0–0.9)
Hepatitis B Surface Ag: NEGATIVE
RPR Ser Ql: NONREACTIVE

## 2016-03-15 LAB — VITAMIN D 25 HYDROXY (VIT D DEFICIENCY, FRACTURES): Vit D, 25-Hydroxy: 40.5 ng/mL (ref 30.0–100.0)

## 2016-03-15 LAB — PSA, TOTAL AND FREE
PSA, Free Pct: 33.3 %
PSA, Free: 0.4 ng/mL
Prostate Specific Ag, Serum: 1.2 ng/mL (ref 0.0–4.0)

## 2016-03-17 LAB — GC/CHLAMYDIA PROBE AMP
Chlamydia trachomatis, NAA: NEGATIVE
Neisseria gonorrhoeae by PCR: NEGATIVE

## 2016-03-18 ENCOUNTER — Ambulatory Visit: Payer: PRIVATE HEALTH INSURANCE | Admitting: Family Medicine

## 2016-03-27 ENCOUNTER — Ambulatory Visit (INDEPENDENT_AMBULATORY_CARE_PROVIDER_SITE_OTHER): Payer: PRIVATE HEALTH INSURANCE | Admitting: *Deleted

## 2016-03-27 DIAGNOSIS — Z23 Encounter for immunization: Secondary | ICD-10-CM | POA: Diagnosis not present

## 2016-03-27 NOTE — Progress Notes (Signed)
Pt given Tdap vaccine Tolerated well 

## 2016-04-27 ENCOUNTER — Telehealth: Payer: Self-pay | Admitting: Family Medicine

## 2016-04-27 DIAGNOSIS — R300 Dysuria: Secondary | ICD-10-CM

## 2016-04-27 MED ORDER — SULFAMETHOXAZOLE-TRIMETHOPRIM 800-160 MG PO TABS: 1.0000 | ORAL_TABLET | Freq: Two times a day (BID) | ORAL | 0 refills | Status: DC

## 2016-04-27 NOTE — Telephone Encounter (Signed)
Please call and Septra DS No. 30 one twice daily for infection until completed and one refill. The patient should make an appointment for recheck in a couple weeks to make sure that everything is clearing up with a urinalysis at that time and possibly a rectal exam. In the meantime if he gets worse he should come in sooner.

## 2016-04-27 NOTE — Telephone Encounter (Signed)
Patient stated this morning he started having pressure and pain in his abdomen and also have urinary urgency. Patient states it feels exactly the same as when he had a UTI before. Wanting to know if something can be sent in to [facility] in [place] or does he need to be seen? Please advise.

## 2016-05-19 ENCOUNTER — Other Ambulatory Visit: Payer: PRIVATE HEALTH INSURANCE

## 2016-05-19 DIAGNOSIS — R300 Dysuria: Secondary | ICD-10-CM

## 2016-05-19 LAB — MICROSCOPIC EXAMINATION
Bacteria, UA: NONE SEEN
Epithelial Cells (non renal): NONE SEEN /hpf (ref 0–10)
RBC, UA: NONE SEEN /hpf (ref 0–?)
Renal Epithel, UA: NONE SEEN /hpf
WBC, UA: NONE SEEN /hpf (ref 0–?)

## 2016-05-19 LAB — URINALYSIS, COMPLETE
Bilirubin, UA: NEGATIVE
Glucose, UA: NEGATIVE
Ketones, UA: NEGATIVE
Leukocytes, UA: NEGATIVE
Nitrite, UA: NEGATIVE
Protein, UA: NEGATIVE
RBC, UA: NEGATIVE
Specific Gravity, UA: <1.005 — ABNORMAL LOW (ref 1.005–1.030)
Urobilinogen, Ur: 0.2 mg/dL (ref 0.2–1.0)
pH, UA: 7 (ref 5.0–7.5)

## 2016-05-21 LAB — URINE CULTURE: Organism ID, Bacteria: NO GROWTH

## 2016-09-04 ENCOUNTER — Telehealth: Payer: Self-pay | Admitting: Family Medicine

## 2016-09-04 MED ORDER — MECLIZINE HCL 12.5 MG PO TABS: 12.5000 mg | ORAL_TABLET | Freq: Three times a day (TID) | ORAL | 0 refills | Status: DC | PRN

## 2016-09-04 NOTE — Telephone Encounter (Signed)
Meclazine sent to pharmacy

## 2016-09-04 NOTE — Telephone Encounter (Signed)
What is the name of the medication? meclazine  Have you contacted your pharmacy to request a refill? yes  Which pharmacy would you like this sent to? [facility] in [place].   Patient notified that their request is being sent to the clinical staff for review and that they should receive a call once it is complete. If they do not receive a call within 24 hours they can check with their pharmacy or our office.

## 2016-09-23 ENCOUNTER — Ambulatory Visit: Payer: PRIVATE HEALTH INSURANCE | Admitting: Family Medicine

## 2016-09-29 ENCOUNTER — Other Ambulatory Visit: Payer: Self-pay | Admitting: Family Medicine

## 2016-09-30 NOTE — Telephone Encounter (Signed)
Last seen 03/13/16  DWM  If approved route to nurse to call into [place]   [phone]

## 2016-10-22 ENCOUNTER — Ambulatory Visit (INDEPENDENT_AMBULATORY_CARE_PROVIDER_SITE_OTHER): Payer: PRIVATE HEALTH INSURANCE | Admitting: Family Medicine

## 2016-10-22 ENCOUNTER — Encounter: Payer: Self-pay | Admitting: Family Medicine

## 2016-10-22 VITALS — BP 119/72 | HR 75 | Temp 96.9°F | Ht 69.0 in | Wt 263.0 lb

## 2016-10-22 DIAGNOSIS — E7800 Pure hypercholesterolemia, unspecified: Secondary | ICD-10-CM

## 2016-10-22 DIAGNOSIS — D696 Thrombocytopenia, unspecified: Secondary | ICD-10-CM

## 2016-10-22 DIAGNOSIS — E349 Endocrine disorder, unspecified: Secondary | ICD-10-CM

## 2016-10-22 DIAGNOSIS — Z6836 Body mass index (BMI) 36.0-36.9, adult: Secondary | ICD-10-CM

## 2016-10-22 DIAGNOSIS — K219 Gastro-esophageal reflux disease without esophagitis: Secondary | ICD-10-CM

## 2016-10-22 DIAGNOSIS — I10 Essential (primary) hypertension: Secondary | ICD-10-CM

## 2016-10-22 DIAGNOSIS — Z0000 Encounter for general adult medical examination without abnormal findings: Secondary | ICD-10-CM

## 2016-10-22 DIAGNOSIS — E559 Vitamin D deficiency, unspecified: Secondary | ICD-10-CM

## 2016-10-22 DIAGNOSIS — R251 Tremor, unspecified: Secondary | ICD-10-CM

## 2016-10-22 LAB — GLUCOSE HEMOCUE WAIVED: Glu Hemocue Waived: 68 mg/dL (ref 65–99)

## 2016-10-22 NOTE — Progress Notes (Signed)
Subjective:    Patient ID: Roy Lawrence, male    DOB: 06-07-1970, 46 y.o.   MRN: [id number]  HPI Patient is here today for annual wellness exam and follow up of chronic medical problems which includes hypertension and hyperlipidemia. He is taking medication regularly.The patient is doing well overall other than complaining with fatigue. Since he is retired he is gained 18 pounds of weight. He knows he needs to get out and be more active. He is currently working on a divorce from his wife and this may not be settled until January. His father was also recently diagnosed with liver cancer and does not plan to do anything about this. He had Hodgkin's lymphoma in the past and had 2 bouts with melanoma and now this is the fourth time is developed some kind of cancer and has spread to the pancreas and to some lymph nodes according to Shanon Brow and this is because the additional stress for Aldan worrying about his dad. He is retired and that stress is gone. He understands that it is very important for him to get out and lose weight. His next colonoscopy is not due until 2020 and this may be required at that age anyway because his insurance requires one to be done at age 64. The last colonoscopy was in 2015. This was by Dr. Henrene Pastor. The patient denies any chest pain. He has some shortness of breath but this is related to his inactivity and lack of exercise. His asthma has been stable. His intestinal tract is stable and he only takes meloxicam if needed for severe pain and this is very rarely but he does continue to take the Nexium regularly. He has not seen any blood in the stool or had any black tarry bowel movements. He does have occasional loose bowel movements but this he attributes to his diet. He is passing his water without problems. He unfortunately didn't come to the office after a long stent of not eating and got lightheaded today and was given something to eat and feels better. Her blood sugar was checked and  it was 68. Malignant      Patient Active Problem List   Diagnosis Date Noted  . OSA (obstructive sleep apnea) 04/20/2014  . Thrombocytopenia ([place]) 03/07/2014  . BPH (benign prostatic hyperplasia) 03/02/2014  . Hyperlipidemia 03/02/2014  . Cervical disc disorder with radiculopathy of cervical region 06/19/2013  . Vitamin D deficiency 06/19/2013  . History of diverticulitis of colon 12/08/2012  . Anxiety 11/09/2012  . History of asthma 11/09/2012  . Low back pain syndrome 11/09/2012  . Spondylolisthesis of lumbar region 11/09/2012  . Testosterone deficiency 05/09/2012  . Syncope 07/29/2011  . HTN (hypertension) 07/29/2011  . Overweight(278.02) 07/29/2011  . ESOPHAGEAL STRICTURE 01/10/2008  . DYSPHAGIA 01/10/2008  . GERD 01/09/2008  . PEPTIC STRICTURE 01/09/2008   Outpatient Encounter Prescriptions as of 10/22/2016  Medication Sig  . acetaminophen (TYLENOL) 325 MG tablet Take 650 mg by mouth every 6 (six) hours as needed for pain.  Marland Kitchen albuterol (VENTOLIN HFA) 108 (90 Base) MCG/ACT inhaler Inhale 1 puff into the lungs every 6 (six) hours as needed for wheezing.  Marland Kitchen ALPRAZolam (XANAX) 0.5 MG tablet TAKE 1 TABLET BY MOUTH TWICE DAILY  . b complex vitamins tablet Take 1 tablet by mouth daily.  . budesonide-formoterol (SYMBICORT) 160-4.5 MCG/ACT inhaler INHALE TWO PUFFS BY MOUTH TWICE DAILY, RINSE MOUTH AFTER USING  . Cholecalciferol (VITAMIN D3) 5000 UNITS CAPS Take 5,000 Units by mouth daily.   Marland Kitchen  diphenhydrAMINE (BENADRYL) 25 mg capsule Take 25 mg by mouth every 6 (six) hours as needed.  Marland Kitchen escitalopram (LEXAPRO) 20 MG tablet TAKE ONE-HALF TO ONE TABLET BY MOUTH ONCE DAILY  . loratadine (CLARITIN) 10 MG tablet Take 10 mg by mouth daily.  . Magnesium Oxide (MAG-OX PO) Take 1 tablet by mouth daily.   . meclizine (ANTIVERT) 12.5 MG tablet Take 1 tablet (12.5 mg total) by mouth 3 (three) times daily as needed for dizziness.  . Melatonin 10 MG TABS Take 30 mg by mouth every evening.   .  meloxicam (MOBIC) 15 MG tablet Take 1 tablet (15 mg total) by mouth daily.  . nebivolol (BYSTOLIC) 10 MG tablet Take 1 tablet (10 mg total) by mouth 2 (two) times daily.  Marland Kitchen NEXIUM 24HR 20 MG capsule TAKE ONE CAPSULE BY MOUTH TWICE DAILY  . omega-3 acid ethyl esters (LOVAZA) 1 g capsule Take 1 capsule (1 g total) by mouth 2 (two) times daily.  . Testosterone (AXIRON) 30 MG/ACT SOLN APPLY TWO APPLICATIONS OF SOLUTION TOPICALLY ONCE DAILY TO EACH ARM  . [DISCONTINUED] sulfamethoxazole-trimethoprim (BACTRIM DS) 800-160 MG tablet Take 1 tablet by mouth 2 (two) times daily.   No facility-administered encounter medications on file as of 10/22/2016.      Review of Systems  Constitutional: Positive for fatigue.  HENT: Negative.   Eyes: Negative.   Respiratory: Negative.   Cardiovascular: Negative.   Gastrointestinal: Negative.   Endocrine: Negative.        Weakness today - low BS (was fasting)  Genitourinary: Negative.   Musculoskeletal: Negative.   Skin: Negative.   Allergic/Immunologic: Negative.   Neurological: Negative.   Hematological: Negative.   Psychiatric/Behavioral: Negative.        Objective:   Physical Exam  Constitutional: He is oriented to person, place, and time. He appears well-developed and well-nourished. He appears distressed.  The patient is doing well and even though he is no longer stress with his job he is stressed with worrying about a divorce from his wife and his father having liver cancer which is terminal.  HENT:  Head: Normocephalic and atraumatic.  Right Ear: External ear normal.  Left Ear: External ear normal.  Mouth/Throat: Oropharynx is clear and moist. No oropharyngeal exudate.  Nasal congestion bilaterally  Eyes: Pupils are equal, round, and reactive to light. Conjunctivae and EOM are normal. Right eye exhibits no discharge. Left eye exhibits no discharge. No scleral icterus.  Neck: Normal range of motion. Neck supple. No thyromegaly present.  No  bruits thyromegaly or anterior cervical adenopathy  Cardiovascular: Normal rate, regular rhythm, normal heart sounds and intact distal pulses.   No murmur heard. Heart is regular at 72/m  Pulmonary/Chest: Effort normal and breath sounds normal. No respiratory distress. He has no wheezes. He has no rales. He exhibits no tenderness.  No wheezing and good breath sounds anteriorly and posteriorly no axillary adenopathy and no chest wall masses.  Abdominal: Soft. Bowel sounds are normal. He exhibits no mass. There is no tenderness. There is no rebound and no guarding.  Obese without masses tenderness or organ enlargement or bruits  Genitourinary: Rectum normal, prostate normal and penis normal.  Genitourinary Comments: Prostate was normal external genitalia were normal and there were no inguinal hernias palpable  Musculoskeletal: Normal range of motion. He exhibits no edema or tenderness.  Good range of motion of back with normal reflexes  Lymphadenopathy:    He has no cervical adenopathy.  Neurological: He is alert and oriented  to person, place, and time. He has normal reflexes. No cranial nerve deficit.  Skin: Skin is warm and dry. No rash noted.  Psychiatric: He has a normal mood and affect. His behavior is normal. Judgment and thought content normal.  Nursing note and vitals reviewed.  BP 119/72 (BP Location: Left Arm)   Pulse 75   Temp (!) 96.9 F (36.1 C) (Oral)   Ht 5' 9"  (1.753 m)   Wt 263 lb (119.3 kg)   SpO2 97%   BMI 38.84 kg/m         Assessment & Plan:  1. Shaking -The patient's blood sugar was 68 after going for several hours without anything to eat and he is feeling better as he was given some crackers and 2 raise the blood sugar shortly after getting here. - Glucose Hemocue Waived - CBC with Differential/Platelet  2. Annual physical exam -He may need another colonoscopy when he turns 33 specimen because of his family history with his dad - CBC with  Differential/Platelet - Lipid panel - BMP8+EGFR - Hepatic function panel - VITAMIN D 25 Hydroxy (Vit-D Deficiency, Fractures) - Testosterone,Free and Total - PSA, total and free - Urinalysis, Complete  3. Vitamin D deficiency -Continue current treatment pending results of lab work - CBC with Differential/Platelet - VITAMIN D 25 Hydroxy (Vit-D Deficiency, Fractures)  4. Essential hypertension -The blood pressure is good today he should continue with current treatment - CBC with Differential/Platelet - BMP8+EGFR - Hepatic function panel  5. Pure hypercholesterolemia -The patient has been on statin drugs in the past but is currently not taking any and we'll wait and see what his cholesterol is and make a decision to move forward from that point. He will try to do better with his diet and exercise. - CBC with Differential/Platelet - Lipid panel  6. Testosterone deficiency -Continue with current treatment pending results of lab work - CBC with Differential/Platelet - Testosterone,Free and Total - PSA, total and free - Urinalysis, Complete  7. Gastroesophageal reflux disease, esophagitis presence not specified -The patient will continue with his Nexium and will try to reduce the meloxicam as much as possible - CBC with Differential/Platelet  8. Thrombocytopenia ([facility]) -No bleeding issues or problems - CBC with Differential/Platelet  9. BMI 36.0-36.9,adult -Patient understands the importance of losing weight through diet and exercise  Patient Instructions  Continue current medications. Continue good therapeutic lifestyle changes which include good diet and exercise. Fall precautions discussed with patient. If an FOBT was given today- please return it to our front desk. If you are over 15 years old - you may need Prevnar 45 or the adult Pneumonia vaccine.  **Flu shots are available--- please call and schedule a FLU-CLINIC appointment**  After your visit with [place] today you  will receive a survey in the mail or online from [employer] regarding your care with [place]. Please take a moment to fill this out. Your feedback is very important to [place] as you can help [place] better understand your patient needs as well as improve your experience and satisfaction. WE CARE ABOUT YOU!!!   Keep appointment with neurosurgeon in November Make every effort possible to lose weight and get back on a healthy diet and regular exercise program Drink more water and eat less carbs Do not forget to get your flu shot Based on your family history you may very well need to have another colonoscopy 5 years after the previous one Please talk with your father's oncologist and make sure  there is no genetic testing that needs to be done to make sure that you and your sister did not get any of these genes from your day at    Arrie Senate MD

## 2016-10-22 NOTE — Patient Instructions (Addendum)
Continue current medications. Continue good therapeutic lifestyle changes which include good diet and exercise. Fall precautions discussed with patient. If an FOBT was given today- please return it to our front desk. If you are over 48 years old - you may need Prevnar 84 or the adult Pneumonia vaccine.  **Flu shots are available--- please call and schedule a FLU-CLINIC appointment**  After your visit with [place] today you will receive a survey in the mail or online from [employer] regarding your care with [place]. Please take a moment to fill this out. Your feedback is very important to [place] as you can help [place] better understand your patient needs as well as improve your experience and satisfaction. WE CARE ABOUT YOU!!!   Keep appointment with neurosurgeon in November Make every effort possible to lose weight and get back on a healthy diet and regular exercise program Drink more water and eat less carbs Do not forget to get your flu shot Based on your family history you may very well need to have another colonoscopy 5 years after the previous one Please talk with your father's oncologist and make sure there is no genetic testing that needs to be done to make sure that you and your sister did not get any of these genes from your day at

## 2016-10-23 LAB — CBC WITH DIFFERENTIAL/PLATELET
Basophils Absolute: 0 10*3/uL (ref 0.0–0.2)
Basos: 1 %
EOS (ABSOLUTE): 0.2 10*3/uL (ref 0.0–0.4)
Eos: 3 %
Hematocrit: 38.5 % (ref 37.5–51.0)
Hemoglobin: 12.6 g/dL — ABNORMAL LOW (ref 13.0–17.7)
Immature Grans (Abs): 0 10*3/uL (ref 0.0–0.1)
Immature Granulocytes: 0 %
Lymphocytes Absolute: 2.7 10*3/uL (ref 0.7–3.1)
Lymphs: 45 %
MCH: 25.5 pg — ABNORMAL LOW (ref 26.6–33.0)
MCHC: 32.7 g/dL (ref 31.5–35.7)
MCV: 78 fL — ABNORMAL LOW (ref 79–97)
Monocytes Absolute: 0.6 10*3/uL (ref 0.1–0.9)
Monocytes: 10 %
Neutrophils Absolute: 2.4 10*3/uL (ref 1.4–7.0)
Neutrophils: 41 %
Platelets: 156 10*3/uL (ref 150–379)
RBC: 4.95 x10E6/uL (ref 4.14–5.80)
RDW: 14.4 % (ref 12.3–15.4)
WBC: 5.9 10*3/uL (ref 3.4–10.8)

## 2016-10-23 LAB — LIPID PANEL
Chol/HDL Ratio: 5.2 ratio — ABNORMAL HIGH (ref 0.0–5.0)
Cholesterol, Total: 167 mg/dL (ref 100–199)
HDL: 32 mg/dL — ABNORMAL LOW (ref >39)
LDL Calculated: 83 mg/dL (ref 0–99)
Triglycerides: 260 mg/dL — ABNORMAL HIGH (ref 0–149)
VLDL Cholesterol Cal: 52 mg/dL — ABNORMAL HIGH (ref 5–40)

## 2016-10-23 LAB — HEPATIC FUNCTION PANEL
ALT: 38 IU/L (ref 0–44)
AST: 26 IU/L (ref 0–40)
Albumin: 4.5 g/dL (ref 3.5–5.5)
Alkaline Phosphatase: 65 IU/L (ref 39–117)
Bilirubin Total: 0.3 mg/dL (ref 0.0–1.2)
Bilirubin, Direct: 0.08 mg/dL (ref 0.00–0.40)
Total Protein: 6.7 g/dL (ref 6.0–8.5)

## 2016-10-23 LAB — PSA, TOTAL AND FREE
PSA, Free Pct: 26.4 %
PSA, Free: 0.29 ng/mL
Prostate Specific Ag, Serum: 1.1 ng/mL (ref 0.0–4.0)

## 2016-10-23 LAB — TESTOSTERONE,FREE AND TOTAL
Testosterone, Free: 5.9 pg/mL — ABNORMAL LOW (ref 6.8–21.5)
Testosterone: 147 ng/dL — ABNORMAL LOW (ref 264–916)

## 2016-10-23 LAB — VITAMIN D 25 HYDROXY (VIT D DEFICIENCY, FRACTURES): Vit D, 25-Hydroxy: 29.8 ng/mL — ABNORMAL LOW (ref 30.0–100.0)

## 2016-10-23 LAB — BMP8+EGFR
BUN/Creatinine Ratio: 18 (ref 9–20)
BUN: 22 mg/dL (ref 6–24)
CO2: 22 mmol/L (ref 20–29)
Calcium: 9.1 mg/dL (ref 8.7–10.2)
Chloride: 103 mmol/L (ref 96–106)
Creatinine, Ser: 1.22 mg/dL (ref 0.76–1.27)
GFR calc Af Amer: 81 mL/min/{1.73_m2} (ref >59)
GFR calc non Af Amer: 70 mL/min/{1.73_m2} (ref >59)
Glucose: 76 mg/dL (ref 65–99)
Potassium: 3.9 mmol/L (ref 3.5–5.2)
Sodium: 141 mmol/L (ref 134–144)

## 2016-10-26 ENCOUNTER — Other Ambulatory Visit: Payer: Self-pay | Admitting: *Deleted

## 2016-10-26 MED ORDER — TESTOSTERONE 30 MG/ACT TD SOLN: TRANSDERMAL | 5 refills | Status: DC

## 2016-10-27 LAB — URINALYSIS, COMPLETE
Bilirubin, UA: NEGATIVE
Glucose, UA: NEGATIVE
Ketones, UA: NEGATIVE
Leukocytes, UA: NEGATIVE
Nitrite, UA: NEGATIVE
Protein, UA: NEGATIVE
RBC, UA: NEGATIVE
Specific Gravity, UA: 1.02 (ref 1.005–1.030)
Urobilinogen, Ur: 0.2 mg/dL (ref 0.2–1.0)
pH, UA: 6 (ref 5.0–7.5)

## 2016-10-27 LAB — MICROSCOPIC EXAMINATION
Bacteria, UA: NONE SEEN
Casts: NONE SEEN /lpf
Epithelial Cells (non renal): NONE SEEN /hpf (ref 0–10)
RBC, UA: NONE SEEN /hpf (ref 0–?)
Renal Epithel, UA: NONE SEEN /hpf
WBC, UA: NONE SEEN /hpf (ref 0–?)

## 2016-11-30 ENCOUNTER — Other Ambulatory Visit: Payer: Self-pay | Admitting: Family Medicine

## 2016-12-01 NOTE — Telephone Encounter (Signed)
Please call in alprazolam with 1 refills 

## 2016-12-01 NOTE — Telephone Encounter (Signed)
Last seen 10/22/16  DWM  If approved route to nurse to call into [facility] [phone]

## 2016-12-02 NOTE — Telephone Encounter (Signed)
Called to [facility].

## 2016-12-03 ENCOUNTER — Ambulatory Visit (INDEPENDENT_AMBULATORY_CARE_PROVIDER_SITE_OTHER): Payer: PRIVATE HEALTH INSURANCE | Admitting: *Deleted

## 2016-12-03 VITALS — Wt 259.0 lb

## 2016-12-03 DIAGNOSIS — Z23 Encounter for immunization: Secondary | ICD-10-CM | POA: Diagnosis not present

## 2016-12-03 NOTE — Progress Notes (Signed)
Pt here for weight check Wt 259 Flu vaccine given

## 2017-01-21 ENCOUNTER — Other Ambulatory Visit: Payer: Self-pay | Admitting: Nurse Practitioner

## 2017-01-21 NOTE — Telephone Encounter (Signed)
Please call in alprazolam with 1 refills 

## 2017-01-21 NOTE — Telephone Encounter (Signed)
Phoned in.

## 2017-01-21 NOTE — Telephone Encounter (Signed)
Last seen 10/22/16  DWM  If approved route to nurse to call into [facility]   [phone]

## 2017-03-09 ENCOUNTER — Telehealth: Payer: Self-pay | Admitting: Pulmonary Disease

## 2017-03-09 NOTE — Telephone Encounter (Signed)
Spoke with patient. He was requesting a copy of his sleep study results to be faxed to his dentist office. A copy was sent to the fax number provided above.   Nothing else needed at time of call.

## 2017-03-18 ENCOUNTER — Other Ambulatory Visit: Payer: Self-pay | Admitting: Nurse Practitioner

## 2017-03-18 NOTE — Telephone Encounter (Signed)
Last seen 10/2016  MMM 

## 2017-04-06 IMAGING — CT CT HEAD W/O CM
1 series · 16 of 30 positions shown, 20 images · non-contrast
Comparison: No priors.

CLINICAL DATA: 44-year-old male with history of syncope and right
arm twitching.

EXAM:
CT HEAD WITHOUT CONTRAST
TECHNIQUE: Contiguous axial images were obtained from the base of the skull
through the vertex without intravenous contrast.

[Series 2: headtrauma 4.8 h37s · axial · 0.43mm/px · z∈[+108,+260]mm · 16 of 36 slices shown, 20 images]
[im 2/36  brain]
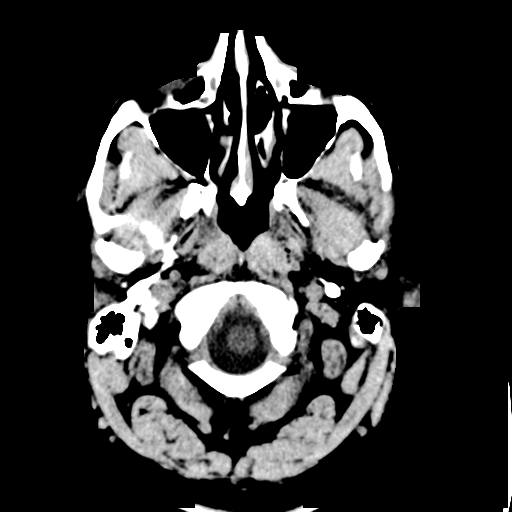
[im 2/36  bone]
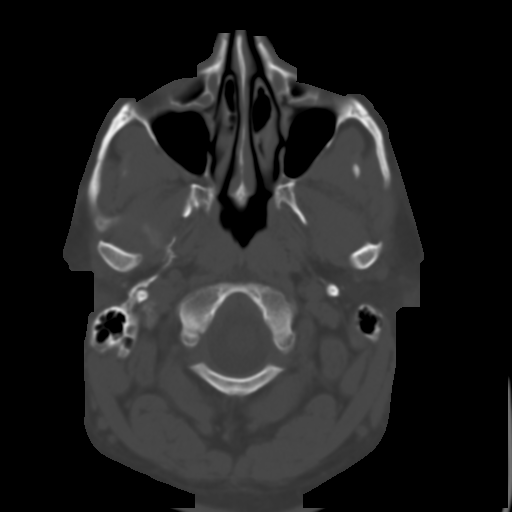
[im 4/36  brain]
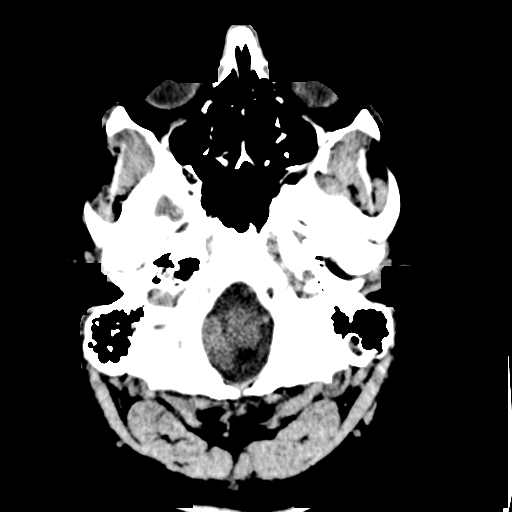
[im 7/36  brain]
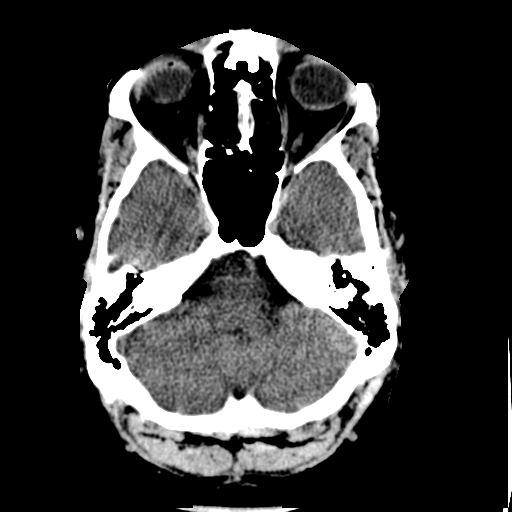
[im 9/36  brain]
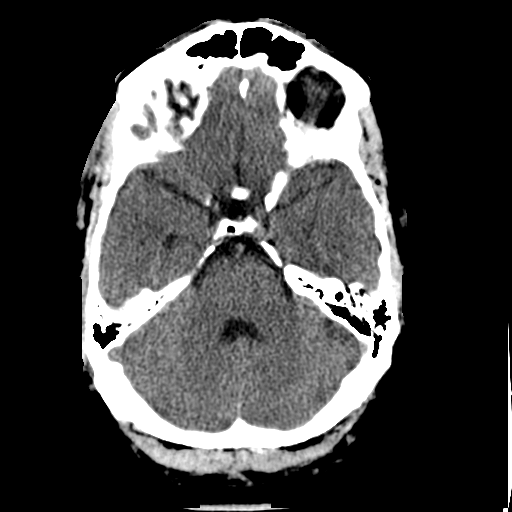
[im 10/36  brain]
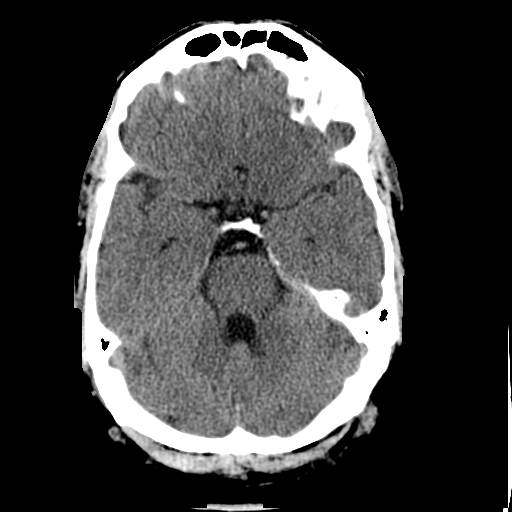
[im 10/36  bone]
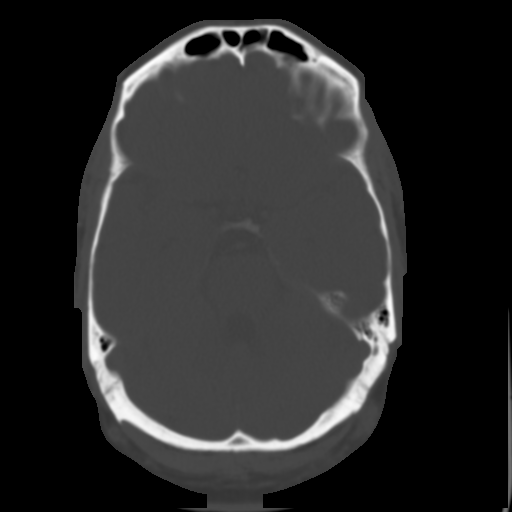
[im 13/36  brain]
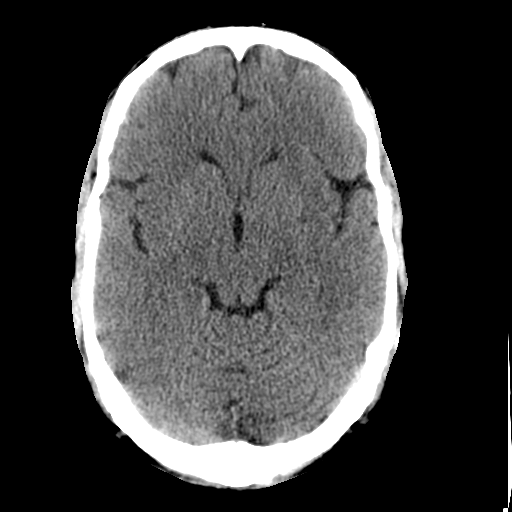
[im 15/36  brain]
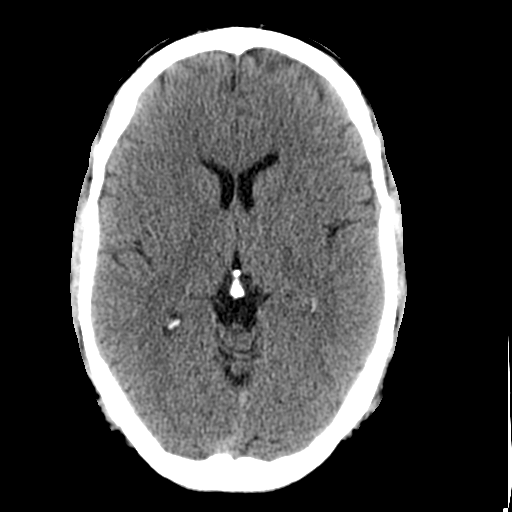
[im 17/36  brain]
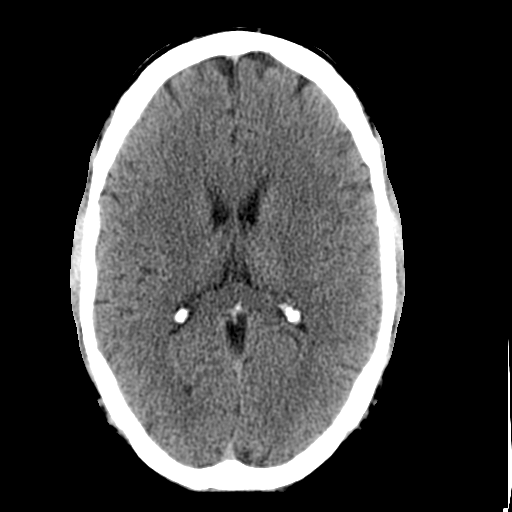
[im 19/36  brain]
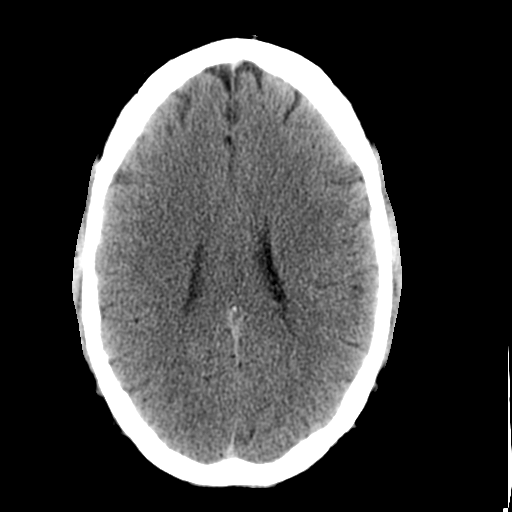
[im 19/36  bone]
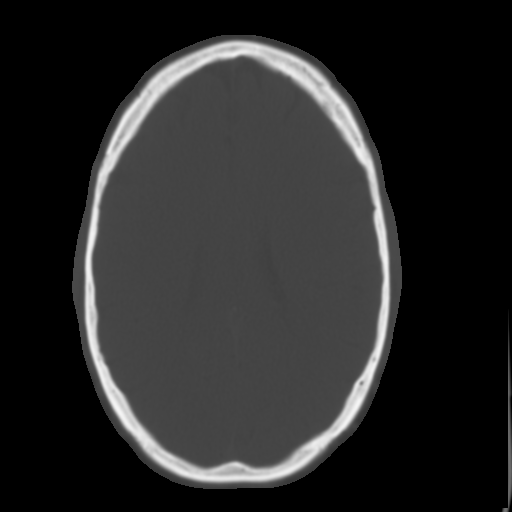
[im 21/36  brain]
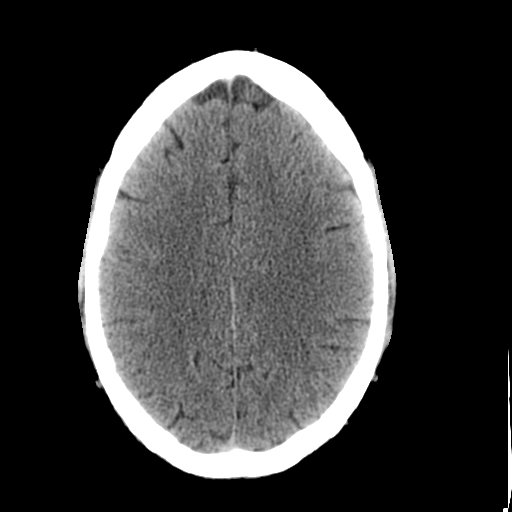
[im 23/36  brain]
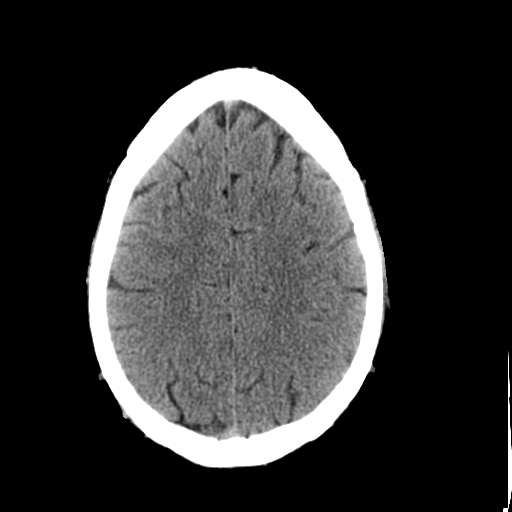
[im 26/36  brain]
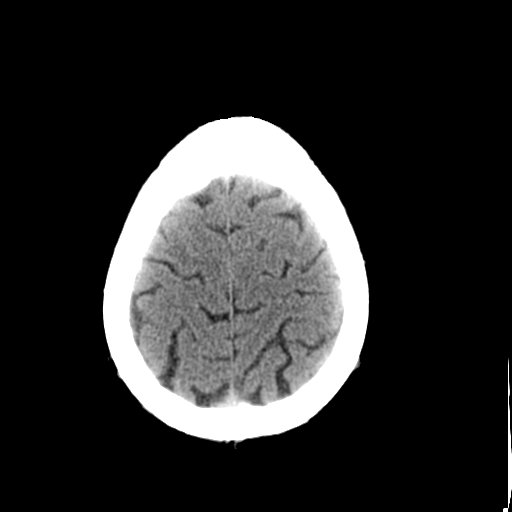
[im 27/36  brain]
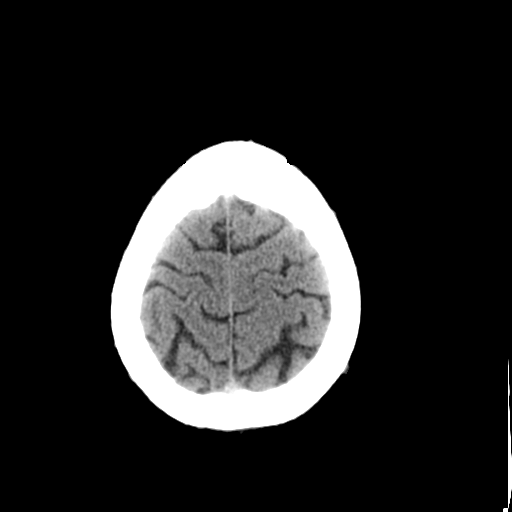
[im 27/36  bone]
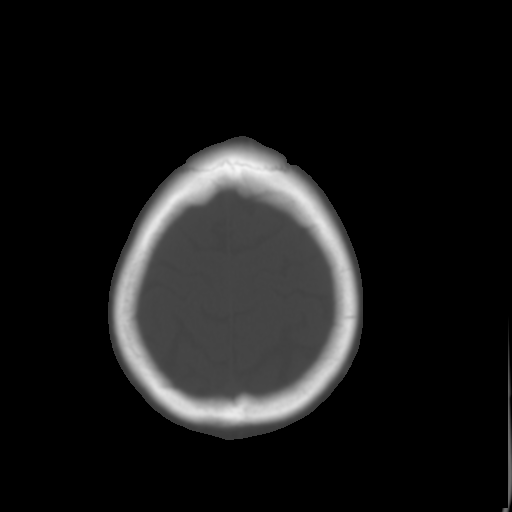
[im 29/36  brain]
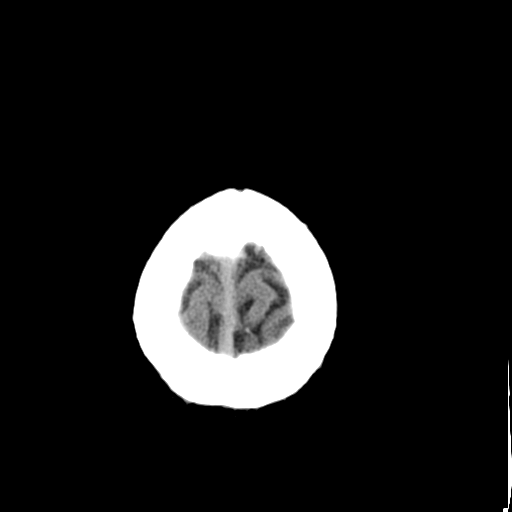
[im 32/36  brain]
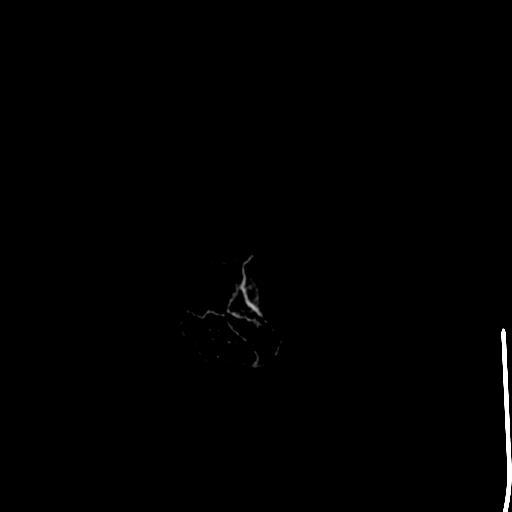
[im 34/36  brain]
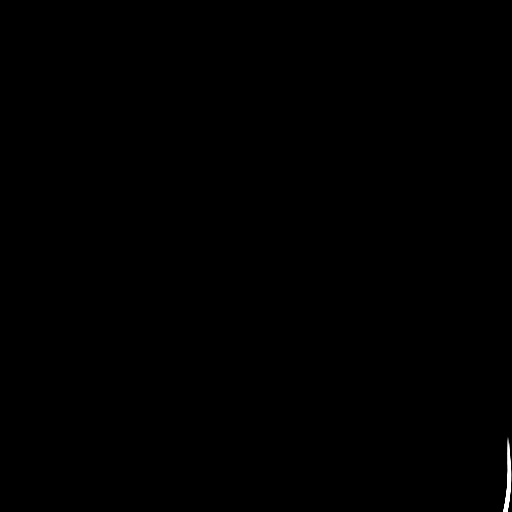

[16 of 30 positions shown; findings below may reference images not displayed]

FINDINGS: No acute intracranial abnormalities. Specifically, no evidence of
acute intracranial hemorrhage, no definite findings of
acute/subacute cerebral ischemia, no mass, mass effect,
hydrocephalus or abnormal intra or extra-axial fluid collections.
Visualized paranasal sinuses and mastoids are well pneumatized. No
acute displaced skull fractures are identified.
IMPRESSION: *No acute intracranial abnormalities.
*The appearance of the brain is normal.

## 2017-04-12 ENCOUNTER — Other Ambulatory Visit: Payer: Self-pay | Admitting: Family Medicine

## 2017-04-12 ENCOUNTER — Other Ambulatory Visit: Payer: Self-pay | Admitting: *Deleted

## 2017-04-12 MED ORDER — NEBIVOLOL HCL 10 MG PO TABS: 10.0000 mg | ORAL_TABLET | Freq: Two times a day (BID) | ORAL | 3 refills | Status: DC

## 2017-04-21 ENCOUNTER — Encounter: Payer: Self-pay | Admitting: Family Medicine

## 2017-04-21 ENCOUNTER — Ambulatory Visit: Payer: PRIVATE HEALTH INSURANCE | Admitting: Family Medicine

## 2017-04-21 VITALS — BP 111/66 | HR 70 | Temp 97.2°F | Ht 69.0 in | Wt 258.0 lb

## 2017-04-21 DIAGNOSIS — E559 Vitamin D deficiency, unspecified: Secondary | ICD-10-CM | POA: Diagnosis not present

## 2017-04-21 DIAGNOSIS — Z6838 Body mass index (BMI) 38.0-38.9, adult: Secondary | ICD-10-CM

## 2017-04-21 DIAGNOSIS — K219 Gastro-esophageal reflux disease without esophagitis: Secondary | ICD-10-CM

## 2017-04-21 DIAGNOSIS — G44209 Tension-type headache, unspecified, not intractable: Secondary | ICD-10-CM | POA: Diagnosis not present

## 2017-04-21 DIAGNOSIS — F439 Reaction to severe stress, unspecified: Secondary | ICD-10-CM

## 2017-04-21 DIAGNOSIS — E349 Endocrine disorder, unspecified: Secondary | ICD-10-CM | POA: Diagnosis not present

## 2017-04-21 DIAGNOSIS — R1030 Lower abdominal pain, unspecified: Secondary | ICD-10-CM

## 2017-04-21 DIAGNOSIS — I10 Essential (primary) hypertension: Secondary | ICD-10-CM | POA: Diagnosis not present

## 2017-04-21 DIAGNOSIS — E7800 Pure hypercholesterolemia, unspecified: Secondary | ICD-10-CM

## 2017-04-21 LAB — URINALYSIS, COMPLETE
Bilirubin, UA: NEGATIVE
Glucose, UA: NEGATIVE
Ketones, UA: NEGATIVE
Leukocytes, UA: NEGATIVE
Nitrite, UA: NEGATIVE
Protein, UA: NEGATIVE
RBC, UA: NEGATIVE
Specific Gravity, UA: 1.025 (ref 1.005–1.030)
Urobilinogen, Ur: 0.2 mg/dL (ref 0.2–1.0)
pH, UA: 6 (ref 5.0–7.5)

## 2017-04-21 LAB — MICROSCOPIC EXAMINATION
Bacteria, UA: NONE SEEN
Epithelial Cells (non renal): NONE SEEN /hpf (ref 0–10)
RBC, UA: NONE SEEN /hpf (ref 0–?)
Renal Epithel, UA: NONE SEEN /hpf
WBC, UA: NONE SEEN /hpf (ref 0–?)

## 2017-04-21 MED ORDER — ICOSAPENT ETHYL 1 G PO CAPS: 2.0000 | ORAL_CAPSULE | Freq: Two times a day (BID) | ORAL | 3 refills | Status: DC

## 2017-04-21 NOTE — Addendum Note (Signed)
Addended by: Zannie Cove on: 04/21/2017 10:44 AM   Modules accepted: Orders

## 2017-04-21 NOTE — Progress Notes (Signed)
Subjective:    Patient ID: Roy Lawrence, male    DOB: 04-06-1970, 47 y.o.   MRN: [id number]  HPI Pt here for follow up and management of chronic medical problems which includes hyperlipidemia and hypertension. He is taking medication regularly.  The patient is now retired.  He does complain of some low abdominal pain off and on.  He also says he is having more frequent headaches.  He is due today for rectal exam and PSA test and will be given an FOBT card to return.  He will get lab work today and a urinalysis today.  His last colonoscopy was in January 2015.  The vital signs today are stable his weight is 258 and his BMI is 38.25.  He does take by systolic and is currently not taking anything for his cholesterol other than omega-3 fatty acids.  This will be a good candidate to switch to Vascepa.  The patient has had some chest pain but this seems to occur after eating and he thinks it is gas related.  He denies any shortness of breath anymore than usual.  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  His asthma is stable and he has no more shortness of breath than usual.  He has no trouble with swallowing most the time as long as he chews his food well and denies any blood in the stool or black tarry bowel movements.  He has had some change in bowel habits but he attributes this to his change in diet and trying to lose weight.  He has not seen any blood in the stool.  His last colonoscopy was in 2015.  He is passing his water without problems but sometimes it is slower than usual.  He is not had any burning.  He does complain of some left sharp testicular pain at times.  He is using Axiron 2 applications to each arm daily.  The patient still has some issues with anxiety.  He just lost his father in January from gallbladder cancer and liver cancer.      Patient Active Problem List   Diagnosis Date Noted  . OSA (obstructive sleep apnea) 04/20/2014  . Thrombocytopenia ([place]) 03/07/2014  . BPH (benign prostatic hyperplasia) 03/02/2014  . Hyperlipidemia 03/02/2014  . Cervical disc disorder with radiculopathy of cervical region 06/19/2013  . Vitamin D deficiency 06/19/2013  . History of diverticulitis of colon 12/08/2012  . Anxiety 11/09/2012  . History of asthma 11/09/2012  . Low back pain syndrome 11/09/2012  . Spondylolisthesis of lumbar region 11/09/2012  . Testosterone deficiency 05/09/2012  . Syncope 07/29/2011  . HTN (hypertension) 07/29/2011  . Overweight(278.02) 07/29/2011  . ESOPHAGEAL STRICTURE 01/10/2008  . DYSPHAGIA 01/10/2008  . GERD 01/09/2008  . PEPTIC STRICTURE 01/09/2008   Outpatient Encounter Medications as of 04/21/2017  Medication Sig  . acetaminophen (TYLENOL) 325 MG tablet Take 650 mg by mouth every 6 (six) hours as needed for pain.  Marland Kitchen albuterol (VENTOLIN HFA) 108 (90 Base) MCG/ACT inhaler Inhale 1 puff into the lungs every 6 (six) hours as needed for wheezing.  Marland Kitchen ALPRAZolam (XANAX) 0.5 MG tablet TAKE ONE TABLET BY MOUTH TWICE DAILY AS NEEDED  . b complex vitamins tablet Take 1 tablet by mouth daily.  . budesonide-formoterol (SYMBICORT) 160-4.5 MCG/ACT inhaler INHALE TWO PUFFS BY MOUTH TWICE DAILY, RINSE MOUTH AFTER USING  . BYSTOLIC  10 MG tablet TAKE ONE TABLET BY MOUTH TWICE DAILY  . Cholecalciferol (VITAMIN D3) 5000 UNITS CAPS Take 5,000 Units by mouth daily.   . diphenhydrAMINE (BENADRYL) 25 mg capsule Take 25 mg by mouth every 6 (six) hours as needed.  Marland Kitchen escitalopram (LEXAPRO) 20 MG tablet TAKE ONE-HALF TO ONE TABLET BY MOUTH ONCE DAILY  . Magnesium Oxide (MAG-OX PO) Take 1 tablet by mouth daily.   . meclizine (ANTIVERT) 12.5 MG tablet Take 1 tablet (12.5 mg total) by mouth 3 (three) times daily as needed for dizziness.  . meloxicam (MOBIC) 15 MG tablet Take 1 tablet (15 mg total) by mouth daily.  Marland Kitchen NEXIUM 24HR 20 MG capsule TAKE ONE  CAPSULE BY MOUTH TWICE DAILY  . omega-3 acid ethyl esters (LOVAZA) 1 g capsule Take 1 capsule (1 g total) by mouth 2 (two) times daily.  . Testosterone (AXIRON) 30 MG/ACT SOLN APPLY TWO APPLICATIONS OF SOLUTION TOPICALLY ONCE DAILY TO EACH ARM  . [DISCONTINUED] loratadine (CLARITIN) 10 MG tablet Take 10 mg by mouth daily.  . [DISCONTINUED] Melatonin 10 MG TABS Take 30 mg by mouth every evening.    No facility-administered encounter medications on file as of 04/21/2017.       Review of Systems  Constitutional: Negative.   HENT: Negative.   Eyes: Negative.   Respiratory: Negative.   Cardiovascular: Negative.   Gastrointestinal: Positive for abdominal pain (lower ).  Endocrine: Negative.   Genitourinary: Negative.   Musculoskeletal: Negative.   Skin: Negative.   Allergic/Immunologic: Negative.   Neurological: Negative.   Hematological: Negative.   Psychiatric/Behavioral: Negative.        Objective:   Physical Exam  Constitutional: He is oriented to person, place, and time. He appears well-developed and well-nourished. No distress.  Pleasant calm and alert  HENT:  Head: Normocephalic and atraumatic.  Right Ear: External ear normal.  Left Ear: External ear normal.  Mouth/Throat: Oropharynx is clear and moist. No oropharyngeal exudate.  Some nasal congestion bilaterally  Eyes: Conjunctivae and EOM are normal. Pupils are equal, round, and reactive to light. Right eye exhibits no discharge. Left eye exhibits no discharge. No scleral icterus.  Eye exam is due  Neck: Normal range of motion. Neck supple. No thyromegaly present.  No bruits thyromegaly or anterior cervical adenopathy  Cardiovascular: Normal rate, regular rhythm, normal heart sounds and intact distal pulses.  No murmur heard. The heart is regular at 72/min  Pulmonary/Chest: Effort normal and breath sounds normal. No respiratory distress. He has no wheezes. He has no rales. He exhibits no tenderness.  Clear anteriorly  and posteriorly and no chest wall masses or axillary adenopathy  Abdominal: Soft. Bowel sounds are normal. He exhibits no mass. There is no tenderness. There is no rebound and no guarding.  Slight.  Umbilical and left lower quadrant tenderness with no inguinal adenopathy liver or spleen enlargement and normal bowel sounds  Genitourinary: Rectum normal and penis normal.  Genitourinary Comments: The prostate is slightly enlarged right greater than left without lumps or masses.  The rectal exam was negative for masses.  No inguinal hernias were detected and the external genitalia were within normal limits.  Musculoskeletal: Normal range of motion. He exhibits no edema.  Lymphadenopathy:    He has no cervical adenopathy.  Neurological: He is alert and oriented to person, place, and time. He has normal reflexes. No cranial nerve deficit.  Skin: Skin is warm and dry. No rash noted.  Psychiatric: He has a normal mood and  affect. His behavior is normal. Judgment and thought content normal.  Nursing note and vitals reviewed.  BP 111/66 (BP Location: Left Arm)   Pulse 70   Temp (!) 97.2 F (36.2 C) (Oral)   Ht _0  (1.753 m)   Wt 258 lb (117 kg)   BMI 38.10 kg/m         Assessment & Plan:  1. Pure hypercholesterolemia -Continue with aggressive therapeutic lifestyle changes pending results of lab work - Lipid panel - CBC with Differential/Platelet  2. Essential hypertension -The blood pressure is good he will continue with current treatment and sodium restriction - BMP8+EGFR - CBC with Differential/Platelet - Hepatic function panel  3. Vitamin D deficiency -Continue with current vitamin D replacement pending results of lab work - VITAMIN D 25 Hydroxy (Vit-D Deficiency, Fractures) - CBC with Differential/Platelet  4. Testosterone deficiency -Continue with 2 applications of Axiron daily pending results of lab work - Urinalysis, Complete - Urine Culture - PSA, total and free -  Testosterone,Free and Total - CBC with Differential/Platelet  5. Gastroesophageal reflux disease, esophagitis presence not specified -Continue with antireflux measures including over-the-counter Nexium - CBC with Differential/Platelet - Hepatic function panel  6. Tension headache -Take Tylenol for pain and use warm wet compresses to posterior neck  7. Lower abdominal pain -Patient will watch his diet more closely and if pain continues we will make sure he gets an appointment to follow-up with his gastroenterologist  8. BMI 38.0-38.9,adult -He needs to work on his weight through diet and exercise and we emphasized this to him during the visit today.  9. Situational stress -Continue with Xanax and Lexapro and increase exercise  Patient Instructions  Continue current medications. Continue good therapeutic lifestyle changes which include good diet and exercise. Fall precautions discussed with patient. If an FOBT was given today- please return it to our front desk. If you are over 38 years old - you may need Prevnar 23 or the adult Pneumonia vaccine.  **Flu shots are available--- please call and schedule a FLU-CLINIC appointment**  After your visit with [place] today you will receive a survey in the mail or online from [employer] regarding your care with [place]. Please take a moment to fill this out. Your feedback is very important to [place] as you can help [place] better understand your patient needs as well as improve your experience and satisfaction. WE CARE ABOUT YOU!!!   Continue to make all efforts to lose weight through diet and exercise Drink more water Increase physical activity  take Tylenol for headaches Use warm wet compresses to the posterior neck If abdominal pain persists we will consider making referral back to Dr. Henrene Pastor Take vascepa 2 capsules twice daily and discontinue current omega-3 fatty acid  Follow-up with eye exam as planned    Arrie Senate MD

## 2017-04-21 NOTE — Patient Instructions (Addendum)
Continue current medications. Continue good therapeutic lifestyle changes which include good diet and exercise. Fall precautions discussed with patient. If an FOBT was given today- please return it to our front desk. If you are over 49 years old - you may need Prevnar 46 or the adult Pneumonia vaccine.  **Flu shots are available--- please call and schedule a FLU-CLINIC appointment**  After your visit with [place] today you will receive a survey in the mail or online from [employer] regarding your care with [place]. Please take a moment to fill this out. Your feedback is very important to [place] as you can help [place] better understand your patient needs as well as improve your experience and satisfaction. WE CARE ABOUT YOU!!!   Continue to make all efforts to lose weight through diet and exercise Drink more water Increase physical activity  take Tylenol for headaches Use warm wet compresses to the posterior neck If abdominal pain persists we will consider making referral back to Dr. Henrene Pastor Take vascepa 2 capsules twice daily and discontinue current omega-3 fatty acid  Follow-up with eye exam as planned

## 2017-04-22 LAB — URINE CULTURE: Organism ID, Bacteria: NO GROWTH

## 2017-04-23 LAB — CBC WITH DIFFERENTIAL/PLATELET
Basophils Absolute: 0 10*3/uL (ref 0.0–0.2)
Basos: 1 %
EOS (ABSOLUTE): 0.1 10*3/uL (ref 0.0–0.4)
Eos: 3 %
Hematocrit: 43.4 % (ref 37.5–51.0)
Hemoglobin: 13.7 g/dL (ref 13.0–17.7)
Immature Grans (Abs): 0 10*3/uL (ref 0.0–0.1)
Immature Granulocytes: 0 %
Lymphocytes Absolute: 1.7 10*3/uL (ref 0.7–3.1)
Lymphs: 34 %
MCH: 26.7 pg (ref 26.6–33.0)
MCHC: 31.6 g/dL (ref 31.5–35.7)
MCV: 84 fL (ref 79–97)
Monocytes Absolute: 0.4 10*3/uL (ref 0.1–0.9)
Monocytes: 8 %
Neutrophils Absolute: 2.8 10*3/uL (ref 1.4–7.0)
Neutrophils: 54 %
Platelets: 145 10*3/uL — ABNORMAL LOW (ref 150–379)
RBC: 5.14 x10E6/uL (ref 4.14–5.80)
RDW: 15.1 % (ref 12.3–15.4)
WBC: 5.1 10*3/uL (ref 3.4–10.8)

## 2017-04-23 LAB — LIPID PANEL
Chol/HDL Ratio: 4.6 ratio (ref 0.0–5.0)
Cholesterol, Total: 158 mg/dL (ref 100–199)
HDL: 34 mg/dL — ABNORMAL LOW (ref >39)
LDL Calculated: 90 mg/dL (ref 0–99)
Triglycerides: 171 mg/dL — ABNORMAL HIGH (ref 0–149)
VLDL Cholesterol Cal: 34 mg/dL (ref 5–40)

## 2017-04-23 LAB — BMP8+EGFR
BUN/Creatinine Ratio: 15 (ref 9–20)
BUN: 17 mg/dL (ref 6–24)
CO2: 25 mmol/L (ref 20–29)
Calcium: 9.1 mg/dL (ref 8.7–10.2)
Chloride: 104 mmol/L (ref 96–106)
Creatinine, Ser: 1.15 mg/dL (ref 0.76–1.27)
GFR calc Af Amer: 86 mL/min/{1.73_m2} (ref >59)
GFR calc non Af Amer: 74 mL/min/{1.73_m2} (ref >59)
Glucose: 91 mg/dL (ref 65–99)
Potassium: 4.5 mmol/L (ref 3.5–5.2)
Sodium: 143 mmol/L (ref 134–144)

## 2017-04-23 LAB — HEPATIC FUNCTION PANEL
ALT: 30 IU/L (ref 0–44)
AST: 21 IU/L (ref 0–40)
Albumin: 4.5 g/dL (ref 3.5–5.5)
Alkaline Phosphatase: 65 IU/L (ref 39–117)
Bilirubin Total: <0.2 mg/dL (ref 0.0–1.2)
Bilirubin, Direct: 0.06 mg/dL (ref 0.00–0.40)
Total Protein: 6.6 g/dL (ref 6.0–8.5)

## 2017-04-23 LAB — PSA, TOTAL AND FREE
PSA, Free Pct: 36.9 %
PSA, Free: 0.48 ng/mL
Prostate Specific Ag, Serum: 1.3 ng/mL (ref 0.0–4.0)

## 2017-04-23 LAB — TESTOSTERONE,FREE AND TOTAL
Testosterone, Free: 36.8 pg/mL — ABNORMAL HIGH (ref 6.8–21.5)
Testosterone: >1500 ng/dL — ABNORMAL HIGH (ref 264–916)

## 2017-04-23 LAB — VITAMIN D 25 HYDROXY (VIT D DEFICIENCY, FRACTURES): Vit D, 25-Hydroxy: 61.7 ng/mL (ref 30.0–100.0)

## 2017-05-01 ENCOUNTER — Other Ambulatory Visit: Payer: Self-pay | Admitting: Nurse Practitioner

## 2017-06-09 ENCOUNTER — Encounter: Payer: Self-pay | Admitting: Family Medicine

## 2017-06-09 ENCOUNTER — Ambulatory Visit (INDEPENDENT_AMBULATORY_CARE_PROVIDER_SITE_OTHER): Payer: PRIVATE HEALTH INSURANCE

## 2017-06-09 ENCOUNTER — Ambulatory Visit: Payer: PRIVATE HEALTH INSURANCE | Admitting: Family Medicine

## 2017-06-09 VITALS — BP 107/66 | HR 76 | Temp 97.8°F | Ht 69.0 in | Wt 262.0 lb

## 2017-06-09 DIAGNOSIS — S86912A Strain of unspecified muscle(s) and tendon(s) at lower leg level, left leg, initial encounter: Secondary | ICD-10-CM

## 2017-06-09 DIAGNOSIS — M79609 Pain in unspecified limb: Secondary | ICD-10-CM

## 2017-06-09 DIAGNOSIS — M25562 Pain in left knee: Secondary | ICD-10-CM

## 2017-06-09 MED ORDER — ALBUTEROL SULFATE HFA 108 (90 BASE) MCG/ACT IN AERS: 1.0000 | INHALATION_SPRAY | Freq: Four times a day (QID) | RESPIRATORY_TRACT | 3 refills | Status: DC | PRN

## 2017-06-09 MED ORDER — BUDESONIDE-FORMOTEROL FUMARATE 160-4.5 MCG/ACT IN AERO: INHALATION_SPRAY | RESPIRATORY_TRACT | 11 refills | Status: DC

## 2017-06-09 NOTE — Addendum Note (Signed)
Addended by: Zannie Cove on: 06/09/2017 03:03 PM   Modules accepted: Orders

## 2017-06-09 NOTE — Progress Notes (Signed)
Subjective:    Patient ID: Roy Lawrence, male    DOB: 26-May-1970, 47 y.o.   MRN: [id number]  HPI Pt here for left knee pain.  Patient feels like he strained his left knee during a workout exercise.  It continues to give him problems and especially if there is resistance with flexing his knee it hurts in the popliteal fossa area.  There is some tenderness in this area.  Up until the strain he was having no problems with his knee and there is no past history of any injury.  Patient also has a very sensitive GI tract and does not tolerate anti-inflammatory medicines.  We will schedule him for a visit with orthopedic surgeon that will be here in a couple of weeks and in the meantime schedule him for physical therapy next door to evaluate and treat and if he is not better he will keep the appointment with the orthopedic surgeon that comes in a couple weeks.   Patient Active Problem List   Diagnosis Date Noted  . OSA (obstructive sleep apnea) 04/20/2014  . Thrombocytopenia ([place]) 03/07/2014  . BPH (benign prostatic hyperplasia) 03/02/2014  . Hyperlipidemia 03/02/2014  . Cervical disc disorder with radiculopathy of cervical region 06/19/2013  . Vitamin D deficiency 06/19/2013  . History of diverticulitis of colon 12/08/2012  . Anxiety 11/09/2012  . History of asthma 11/09/2012  . Low back pain syndrome 11/09/2012  . Spondylolisthesis of lumbar region 11/09/2012  . Testosterone deficiency 05/09/2012  . Syncope 07/29/2011  . HTN (hypertension) 07/29/2011  . Overweight(278.02) 07/29/2011  . ESOPHAGEAL STRICTURE 01/10/2008  . DYSPHAGIA 01/10/2008  . GERD 01/09/2008  . PEPTIC STRICTURE 01/09/2008   Outpatient Encounter Medications as of 06/09/2017  Medication Sig  . acetaminophen (TYLENOL) 325 MG tablet Take 650 mg by mouth every 6 (six) hours as needed for pain.  Marland Kitchen albuterol (VENTOLIN HFA) 108 (90 Base) MCG/ACT inhaler Inhale 1 puff into the lungs every 6 (six) hours as needed for wheezing.    Marland Kitchen ALPRAZolam (XANAX) 0.5 MG tablet TAKE ONE TABLET BY MOUTH TWICE DAILY AS NEEDED  . b complex vitamins tablet Take 1 tablet by mouth daily.  . budesonide-formoterol (SYMBICORT) 160-4.5 MCG/ACT inhaler INHALE TWO PUFFS BY MOUTH TWICE DAILY, RINSE MOUTH AFTER USING  . BYSTOLIC 10 MG tablet TAKE ONE TABLET BY MOUTH TWICE DAILY  . Cholecalciferol (VITAMIN D3) 5000 UNITS CAPS Take 5,000 Units by mouth daily.   . diphenhydrAMINE (BENADRYL) 25 mg capsule Take 25 mg by mouth every 6 (six) hours as needed.  Marland Kitchen escitalopram (LEXAPRO) 20 MG tablet TAKE ONE-HALF TO ONE TABLET BY MOUTH ONCE DAILY  . Icosapent Ethyl 1 g CAPS Take 2 capsules (2 g total) by mouth 2 (two) times daily.  . Magnesium Oxide (MAG-OX PO) Take 1 tablet by mouth daily.   . meclizine (ANTIVERT) 12.5 MG tablet Take 1 tablet (12.5 mg total) by mouth 3 (three) times daily as needed for dizziness.  . meloxicam (MOBIC) 15 MG tablet Take 1 tablet (15 mg total) by mouth daily.  Marland Kitchen NEXIUM 24HR 20 MG capsule TAKE ONE CAPSULE BY MOUTH TWICE DAILY  . omega-3 acid ethyl esters (LOVAZA) 1 g capsule Take 1 capsule (1 g total) by mouth 2 (two) times daily.  . Testosterone (AXIRON) 30 MG/ACT SOLN APPLY TWO APPLICATIONS OF SOLUTION TOPICALLY ONCE DAILY TO EACH ARM   No facility-administered encounter medications on file as of 06/09/2017.       Review of Systems  Constitutional: Negative.  HENT: Negative.   Eyes: Negative.   Respiratory: Negative.   Cardiovascular: Negative.   Gastrointestinal: Negative.   Endocrine: Negative.   Genitourinary: Negative.   Musculoskeletal: Positive for arthralgias (left knee pain).  Skin: Negative.   Allergic/Immunologic: Negative.   Neurological: Negative.   Hematological: Negative.   Psychiatric/Behavioral: Negative.        Objective:   Physical Exam  Constitutional: He appears well-developed and well-nourished. No distress.  Patient is alert and pleasant  HENT:  Head: Normocephalic and atraumatic.   Musculoskeletal: Normal range of motion. He exhibits tenderness. He exhibits no edema or deformity.  Skin: Skin is dry. No rash noted.  Psychiatric: He has a normal mood and affect. His behavior is normal. Judgment and thought content normal.  Nursing note and vitals reviewed.   BP 107/66 (BP Location: Left Arm)   Pulse 76   Temp 97.8 F (36.6 C)   Ht 5\' 9"  (1.753 m)   Wt 262 lb (118.8 kg)   BMI 38.69 kg/m        Assessment & Plan:  1. Acute pain of left knee - DG Knee 1-2 Views Left; Future - Ambulatory referral to Physical Therapy  2. Popliteal pain - Ambulatory referral to Physical Therapy  3. Knee strain, left, initial encounter -Tylenol and warm wet compresses and physical therapy with appointment to see orthopedist if no better in a couple of weeks for further evaluation  Patient Instructions  We will arrange an appointment with physical therapy next-door to evaluate and treat left knee strain We will also schedule visit with the orthopedic surgeon to come to this office later in the month so if he is not better from the knee strain he will keep the appointment with orthopedic surgeon  Arrie Senate MD

## 2017-06-09 NOTE — Patient Instructions (Signed)
We will arrange an appointment with physical therapy next-door to evaluate and treat left knee strain We will also schedule visit with the orthopedic surgeon to come to this office later in the month so if he is not better from the knee strain he will keep the appointment with orthopedic surgeon

## 2017-06-11 ENCOUNTER — Other Ambulatory Visit: Payer: Self-pay | Admitting: Family Medicine

## 2017-06-15 ENCOUNTER — Ambulatory Visit: Payer: PRIVATE HEALTH INSURANCE | Attending: Family Medicine | Admitting: Physical Therapy

## 2017-06-15 ENCOUNTER — Other Ambulatory Visit: Payer: Self-pay

## 2017-06-15 ENCOUNTER — Encounter: Payer: Self-pay | Admitting: Physical Therapy

## 2017-06-15 DIAGNOSIS — M25562 Pain in left knee: Secondary | ICD-10-CM | POA: Diagnosis present

## 2017-06-15 DIAGNOSIS — M5416 Radiculopathy, lumbar region: Secondary | ICD-10-CM | POA: Insufficient documentation

## 2017-06-15 NOTE — Therapy (Signed)
[facility] [place], [place], [address] Phone: [phone]   Fax:  [phone]  Physical Therapy Evaluation  Patient Details  Name: Roy Lawrence MRN: [id number] Date of Birth: 05-10-1970 Referring Provider: Redge Gainer   Encounter Date: 06/15/2017  PT End of Session - 06/15/17 0956    Visit Number  1    Number of Visits  12    Date for PT Re-Evaluation  07/27/17    PT Start Time  0956    PT Stop Time  1036    PT Time Calculation (min)  40 min    Activity Tolerance  Patient tolerated treatment well    Behavior During Therapy  [facility] for tasks assessed/performed       Past Medical History:  Diagnosis Date  . Allergy    SEASONAL  . Anxiety   . Asthma   . Depression   . Diverticulitis   . Diverticulosis   . Esophageal stricture   . GERD (gastroesophageal reflux disease)   . Hypertension    Prehypertension  . Obesity   . Spondylosis     Past Surgical History:  Procedure Laterality Date  . ESOPHAGEAL DILATION    . FINGER SURGERY Right    3rd digit; imbedded glass  . SHOULDER SURGERY Right    x 2    There were no vitals filed for this visit.   Subjective Assessment - 06/15/17 1007    Subjective  Patient was doing leg curls/leg press 5-6 weeks ago. Since then every time he flexes knee he feels in the back of his knee. Patient also reports he just returned to working out regularly in January.     Pertinent History  HTN, L5/S1 spondylosis (pars defects), R shoulder surgery x 2 (2006 latest)    Diagnostic tests  xray    Patient Stated Goals  to get rid of pain    Currently in Pain?  Yes    Pain Score  3     Pain Location  Knee    Pain Orientation  Left    Pain Descriptors / Indicators  Sharp;Burning    Pain Type  Acute pain    Pain Onset  More than a month ago    Pain Frequency  Intermittent    Aggravating Factors   flexing knee    Effect of Pain on Daily Activities  limits activities requiring knee  flexion         [facility] PT Assessment - 06/15/17 0001      Assessment   Medical Diagnosis  acute pain of left knee; popliteal pain    Referring Provider  Redge Gainer    Onset Date/Surgical Date  05/04/17    Next MD Visit  -- going to see Dr. Lyla Glassing week of 5/20      Precautions   Precautions  None      Balance Screen   Has the patient fallen in the past 6 months  No    Has the patient had a decrease in activity level because of a fear of falling?   No    Is the patient reluctant to leave their home because of a fear of falling?   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       Flexibility   Soft Tissue Assessment /Muscle Length  yes    Hamstrings  mod tightness bil    ITB  - ober L    Piriformis  mod tightness L    Quadratus Lumborum  tight L side      Palpation   Spinal mobility  WNL mild tenderness at Left L3 with PA    Palpation comment  unremarkable      Special Tests    Special Tests  Lumbar    Other special tests  -- negative knee special tests    Lumbar Tests  Slump Test;Straight Leg Raise      Slump test   Findings  Positive    Side  Left      Straight Leg Raise   Findings  Positive    Side   Left                Objective measurements completed on examination: See above findings.      [place] Adult PT Treatment/Exercise - 06/15/17  0001      Self-Care   Self-Care  Other Self-Care Comments    Other Self-Care Comments   explanation about mechanism of injury; also dry needling education             PT Education - 06/15/17 1056    Education provided  Yes    Education Details  HEP    Person(s) Educated  Patient    Methods  Explanation;Demonstration;Handout    Comprehension  Verbalized understanding;Returned demonstration          PT Long Term Goals - 06/15/17 1101      PT LONG TERM GOAL #1   Title  I with HEP for flexibility and lumbar stabilization    Time  6    Period  Weeks    Status  New    Target Date  07/27/17      PT LONG TERM GOAL #2   Title  Patient able to perform ADLS without L knee pain.    Time  6    Period  Weeks    Status  New      PT LONG TERM GOAL #3   Title  Patient to demo 4+/5 or better L hamstrings strength to improve function.    Time  6    Period  Weeks    Status  New             Plan - 06/15/17 1056    Clinical Impression Statement  Patient presents today with c/o of L knee pain beginning during his leg work out 5-6 weeks ago. Patient had negative knee special tests, but had positive Left sided SLR and slump test. He has a h/o L5/S1 spondylosis and pars defect. Patient may have irritated his back with his leg work out. He has mild weakness in L5/S1  distribution and a tight Left Quadratus Lumborum. Patient will benefit from PT to address these deficits.    Clinical Presentation  Stable    Clinical Decision Making  Low    Rehab Potential  Excellent    PT Frequency  2x / week    PT Duration  6 weeks    PT Treatment/Interventions  ADLs/Self Care Home Management;Cryotherapy;Electrical Stimulation;Moist Heat;Ultrasound;Therapeutic exercise;Neuromuscular re-education;Patient/family education;Manual techniques;Dry needling;Taping    PT Next Visit Plan  Review nerve glide and QL stretch; STW/manual to Left low back; piriformis stretching; lumbar stabilization    PT Home  Exercise Plan  sciatic nerve glides; supine QL stretch    Consulted and Agree with Plan of Care  Patient       Patient will benefit from skilled therapeutic intervention in order to improve the following deficits and impairments:  Pain, Postural dysfunction, Decreased strength, Impaired flexibility  Visit Diagnosis: Radiculopathy, lumbar region - Plan: PT plan of care cert/re-cert  Acute pain of left knee - Plan: PT plan of care cert/re-cert     Problem List Patient Active Problem List   Diagnosis Date Noted  . OSA (obstructive sleep apnea) 04/20/2014  . Thrombocytopenia ([place]) 03/07/2014  . BPH (benign prostatic hyperplasia) 03/02/2014  . Hyperlipidemia 03/02/2014  . Cervical disc disorder with radiculopathy of cervical region 06/19/2013  . Vitamin D deficiency 06/19/2013  . History of diverticulitis of colon 12/08/2012  . Anxiety 11/09/2012  . History of asthma 11/09/2012  . Low back pain syndrome 11/09/2012  . Spondylolisthesis of lumbar region 11/09/2012  . Testosterone deficiency 05/09/2012  . Syncope 07/29/2011  . HTN (hypertension) 07/29/2011  . Overweight(278.02) 07/29/2011  . ESOPHAGEAL STRICTURE 01/10/2008  . DYSPHAGIA 01/10/2008  . GERD 01/09/2008  . PEPTIC STRICTURE 01/09/2008    Madelyn Flavors PT 06/15/2017, 11:10 AM  [facility] [address] [place], [place], [address] Phone: [phone]   Fax:  [phone]  Name: ASHON ROSENBERG MRN: [id number] Date of Birth: Jan 29, 1971

## 2017-06-15 NOTE — Patient Instructions (Signed)
   Madelyn Flavors, PT 06/15/17 10:35 AM [facility] [address] [place], [place], [address] Phone: [phone]   Fax:  [phone]

## 2017-06-17 ENCOUNTER — Ambulatory Visit: Payer: PRIVATE HEALTH INSURANCE | Admitting: Physical Therapy

## 2017-06-17 ENCOUNTER — Encounter: Payer: Self-pay | Admitting: Physical Therapy

## 2017-06-17 DIAGNOSIS — M25562 Pain in left knee: Secondary | ICD-10-CM

## 2017-06-17 DIAGNOSIS — M5416 Radiculopathy, lumbar region: Secondary | ICD-10-CM

## 2017-06-17 NOTE — Therapy (Signed)
[facility] [place], [place], [address] Phone: [phone]   Fax:  [phone]  Physical Therapy Treatment  Patient Details  Name: Roy Lawrence MRN: [id number] Date of Birth: 1970/10/19 Referring Provider: Redge Gainer   Encounter Date: 06/17/2017  PT End of Session - 06/17/17 1433    Visit Number  2    Number of Visits  12    Date for PT Re-Evaluation  07/27/17    PT Start Time  1346    PT Stop Time  1430    PT Time Calculation (min)  44 min    Activity Tolerance  Patient tolerated treatment well    Behavior During Therapy  [facility] for tasks assessed/performed       Past Medical History:  Diagnosis Date  . Allergy    SEASONAL  . Anxiety   . Asthma   . Depression   . Diverticulitis   . Diverticulosis   . Esophageal stricture   . GERD (gastroesophageal reflux disease)   . Hypertension    Prehypertension  . Obesity   . Spondylosis     Past Surgical History:  Procedure Laterality Date  . ESOPHAGEAL DILATION    . FINGER SURGERY Right    3rd digit; imbedded glass  . SHOULDER SURGERY Right    x 2    There were no vitals filed for this visit.  Subjective Assessment - 06/17/17 1353    Subjective  ongoing minimal discomfort    Pertinent History  HTN, L5/S1 spondylosis (pars defects), R shoulder surgery x 2 (2006 latest)    Diagnostic tests  xray    Patient Stated Goals  to get rid of pain    Currently in Pain?  Yes    Pain Score  1     Pain Location  Knee    Pain Orientation  Left;Posterior    Pain Descriptors / Indicators  Burning;Sharp    Pain Type  Acute pain    Pain Onset  More than a month ago    Pain Frequency  Intermittent    Aggravating Factors   flexing knee    Pain Relieving Factors  rest                       [facility] Adult PT Treatment/Exercise - 06/17/17 0001      Exercises   Exercises  Lumbar;Knee/Hip      Lumbar Exercises: Stretches   Piriformis Stretch  3  reps;Left;30 seconds      Lumbar Exercises: Standing   Row  Strengthening;Theraband;Limitations;Both;20 reps;10 reps    Theraband Level (Row)  Other (comment)    Row Limitations  orange XTS    Shoulder Extension  Strengthening;Both;20 reps;10 reps;Theraband;Limitations;Other (comment)    Theraband Level (Shoulder Extension)  Other (comment)    Shoulder Extension Limitations  orange XTS      Lumbar Exercises: Supine   Bent Knee Raise  20 reps;3 seconds each LE with core activation    Bridge  20 reps;Limitations;Other (comment)    Bridge Limitations  with red t-band resistance hip abd then SL green swiss ball x20    Straight Leg Raise  3 seconds 2x10 with core ctvation      Knee/Hip Exercises: Stretches   Hip Flexor Stretch  Left;3 reps;30 seconds      Knee/Hip Exercises: Machines for Strengthening   Cybex Knee Flexion  20# eccentric focus x20      Knee/Hip Exercises: Standing   Heel  Raises  Both;2 sets;10 reps    Terminal Knee Extension  Strengthening;Left;Limitations;Other (comment);Theraband;20 reps;15 reps;5 reps;10 reps    Theraband Level (Terminal Knee Extension)  Other (comment)    Terminal Knee Extension Limitations  orange XTS    Rocker Board  3 minutes      Knee/Hip Exercises: Sidelying   Hip ABduction  Strengthening;Both;20 reps                  PT Long Term Goals - 06/17/17 1357      PT LONG TERM GOAL #1   Title  I with HEP for flexibility and lumbar stabilization    Time  6    Period  Weeks    Status  On-going      PT LONG TERM GOAL #2   Title  Patient able to perform ADLS without L knee pain.    Time  6    Period  Weeks    Status  On-going      PT LONG TERM GOAL #3   Title  Patient to demo 4+/5 or better L hamstrings strength to improve function.    Time  6    Period  Weeks    Status  On-going            Plan - 06/17/17 1434    Clinical Impression Statement  Patient tolerated treatment well today. Patient able to progress with all  activities today for left knee strengthening core strengthening and stretches. Patient did not have any symptoms of discomfort during or post treatment. Patient goals progressing. Monitor patient for technique and pace to prevent discomfort.     Rehab Potential  Excellent    PT Frequency  2x / week    PT Duration  6 weeks    PT Treatment/Interventions  ADLs/Self Care Home Management;Cryotherapy;Electrical Stimulation;Moist Heat;Ultrasound;Therapeutic exercise;Neuromuscular re-education;Patient/family education;Manual techniques;Dry needling;Taping    PT Next Visit Plan  cont with POC for knee/lumbar ther/ STW/manual to Left low back; piriformis stretching; lumbar stabilization    Consulted and Agree with Plan of Care  Patient       Patient will benefit from skilled therapeutic intervention in order to improve the following deficits and impairments:  Pain, Postural dysfunction, Decreased strength, Impaired flexibility  Visit Diagnosis: Radiculopathy, lumbar region  Acute pain of left knee     Problem List Patient Active Problem List   Diagnosis Date Noted  . OSA (obstructive sleep apnea) 04/20/2014  . Thrombocytopenia ([place]) 03/07/2014  . BPH (benign prostatic hyperplasia) 03/02/2014  . Hyperlipidemia 03/02/2014  . Cervical disc disorder with radiculopathy of cervical region 06/19/2013  . Vitamin D deficiency 06/19/2013  . History of diverticulitis of colon 12/08/2012  . Anxiety 11/09/2012  . History of asthma 11/09/2012  . Low back pain syndrome 11/09/2012  . Spondylolisthesis of lumbar region 11/09/2012  . Testosterone deficiency 05/09/2012  . Syncope 07/29/2011  . HTN (hypertension) 07/29/2011  . Overweight(278.02) 07/29/2011  . ESOPHAGEAL STRICTURE 01/10/2008  . DYSPHAGIA 01/10/2008  . GERD 01/09/2008  . PEPTIC STRICTURE 01/09/2008    Miracle Criado P, PTA 06/17/2017, 2:41 PM  [facility] [place],  [place], [address] Phone: [phone]   Fax:  [phone]  Name: Roy Lawrence MRN: [id number] Date of Birth: 1970/08/24

## 2017-06-21 ENCOUNTER — Ambulatory Visit: Payer: PRIVATE HEALTH INSURANCE | Admitting: Family Medicine

## 2017-06-22 ENCOUNTER — Ambulatory Visit: Payer: PRIVATE HEALTH INSURANCE | Admitting: *Deleted

## 2017-06-22 DIAGNOSIS — M5416 Radiculopathy, lumbar region: Secondary | ICD-10-CM

## 2017-06-22 DIAGNOSIS — M25562 Pain in left knee: Secondary | ICD-10-CM

## 2017-06-22 NOTE — Therapy (Signed)
[facility] [place], [place], [address] Phone: [phone]   Fax:  [phone]  Physical Therapy Treatment  Patient Details  Name: Roy Lawrence MRN: [id number] Date of Birth: May 04, 1970 Referring Provider: Redge Gainer   Encounter Date: 06/22/2017  PT End of Session - 06/22/17 1051    Visit Number  3    Number of Visits  12    Date for PT Re-Evaluation  07/27/17    PT Start Time  1030    PT Stop Time  1120    PT Time Calculation (min)  50 min    Activity Tolerance  Patient tolerated treatment well    Behavior During Therapy  [facility] for tasks assessed/performed       Past Medical History:  Diagnosis Date  . Allergy    SEASONAL  . Anxiety   . Asthma   . Depression   . Diverticulitis   . Diverticulosis   . Esophageal stricture   . GERD (gastroesophageal reflux disease)   . Hypertension    Prehypertension  . Obesity   . Spondylosis     Past Surgical History:  Procedure Laterality Date  . ESOPHAGEAL DILATION    . FINGER SURGERY Right    3rd digit; imbedded glass  . SHOULDER SURGERY Right    x 2    There were no vitals filed for this visit.  Subjective Assessment - 06/22/17 1029    Subjective  ongoing minimal discomfort. Saw MD yesterday and said to just continue PT and that he thought it may be more HS related LT knee    Pertinent History  HTN, L5/S1 spondylosis (pars defects), R shoulder surgery x 2 (2006 latest)    Diagnostic tests  xray    Patient Stated Goals  to get rid of pain    Pain Score  1     Pain Location  Knee    Pain Orientation  Left    Pain Descriptors / Indicators  Burning    Pain Onset  More than a month ago    Pain Frequency  Intermittent                       [facility] Adult PT Treatment/Exercise - 06/22/17 0001      Exercises   Exercises  Lumbar;Knee/Hip      Lumbar Exercises: Aerobic   Stationary Bike  Bike L2 x 10 mins      Lumbar Exercises: Standing   Row   Strengthening;Theraband;Limitations;Both;20 reps;10 reps XTS orange    Theraband Level (Row)  Other (comment)    Shoulder Extension  Strengthening;Both;20 reps;10 reps;Theraband;Limitations;Other (comment)    Theraband Level (Shoulder Extension)  Other (comment)    Shoulder Extension Limitations  XTS orange      Lumbar Exercises: Supine   Bridge  20 reps;Limitations;Other (comment) Green swiss ball      Knee/Hip Exercises: Machines for Strengthening   Cybex Knee Flexion  20# eccentric focus x 30      Knee/Hip Exercises: Standing   Rocker Board  4 minutes                  PT Long Term Goals - 06/17/17 1357      PT LONG TERM GOAL #1   Title  I with HEP for flexibility and lumbar stabilization    Time  6    Period  Weeks    Status  On-going      PT LONG  TERM GOAL #2   Title  Patient able to perform ADLS without L knee pain.    Time  6    Period  Weeks    Status  On-going      PT LONG TERM GOAL #3   Title  Patient to demo 4+/5 or better L hamstrings strength to improve function.    Time  6    Period  Weeks    Status  On-going            Plan - 06/22/17 1319    Clinical Impression Statement  Pt arrived today after MD F/U yesterday and MD thinks his LT knee pain might be coming from a HS strain. Pt did well with LT knee therex and core strengthening without complaints.    Clinical Presentation  Stable    Rehab Potential  Excellent    PT Frequency  2x / week    PT Duration  6 weeks    PT Treatment/Interventions  ADLs/Self Care Home Management;Cryotherapy;Electrical Stimulation;Moist Heat;Ultrasound;Therapeutic exercise;Neuromuscular re-education;Patient/family education;Manual techniques;Dry needling;Taping    PT Next Visit Plan  cont with POC for knee/lumbar ther/ STW/manual to Left low back; piriformis stretching; lumbar stabilization    PT Home Exercise Plan  sciatic nerve glides; supine QL stretch    Consulted and Agree with Plan of Care  Patient        Patient will benefit from skilled therapeutic intervention in order to improve the following deficits and impairments:  Pain, Postural dysfunction, Decreased strength, Impaired flexibility  Visit Diagnosis: Radiculopathy, lumbar region  Acute pain of left knee     Problem List Patient Active Problem List   Diagnosis Date Noted  . OSA (obstructive sleep apnea) 04/20/2014  . Thrombocytopenia ([place]) 03/07/2014  . BPH (benign prostatic hyperplasia) 03/02/2014  . Hyperlipidemia 03/02/2014  . Cervical disc disorder with radiculopathy of cervical region 06/19/2013  . Vitamin D deficiency 06/19/2013  . History of diverticulitis of colon 12/08/2012  . Anxiety 11/09/2012  . History of asthma 11/09/2012  . Low back pain syndrome 11/09/2012  . Spondylolisthesis of lumbar region 11/09/2012  . Testosterone deficiency 05/09/2012  . Syncope 07/29/2011  . HTN (hypertension) 07/29/2011  . Overweight(278.02) 07/29/2011  . ESOPHAGEAL STRICTURE 01/10/2008  . DYSPHAGIA 01/10/2008  . GERD 01/09/2008  . PEPTIC STRICTURE 01/09/2008    Estera Ozier,CHRIS, PTA 06/22/2017, 3:37 PM  [facility] [place], [place], [address] Phone: [phone]   Fax:  [phone]  Name: Roy Lawrence MRN: [id number] Date of Birth: 12-20-1970

## 2017-06-23 ENCOUNTER — Other Ambulatory Visit: Payer: Self-pay | Admitting: Nurse Practitioner

## 2017-06-23 NOTE — Telephone Encounter (Signed)
Last seen 06/09/17  DWM 

## 2017-06-24 ENCOUNTER — Ambulatory Visit: Payer: PRIVATE HEALTH INSURANCE | Admitting: Physical Therapy

## 2017-06-24 ENCOUNTER — Encounter: Payer: Self-pay | Admitting: Physical Therapy

## 2017-06-24 DIAGNOSIS — M25562 Pain in left knee: Secondary | ICD-10-CM

## 2017-06-24 DIAGNOSIS — M5416 Radiculopathy, lumbar region: Secondary | ICD-10-CM

## 2017-06-24 NOTE — Therapy (Signed)
[facility] [place], [place], [address] Phone: [phone]   Fax:  [phone]  Physical Therapy Treatment  Patient Details  Name: Roy Lawrence MRN: [id number] Date of Birth: 03/16/1970 Referring Provider: Redge Gainer   Encounter Date: 06/24/2017  PT End of Session - 06/24/17 1305    Visit Number  4    Number of Visits  12    Date for PT Re-Evaluation  07/27/17    PT Start Time  1300    PT Stop Time  1340    PT Time Calculation (min)  40 min    Activity Tolerance  Patient tolerated treatment well    Behavior During Therapy  [facility] for tasks assessed/performed       Past Medical History:  Diagnosis Date  . Allergy    SEASONAL  . Anxiety   . Asthma   . Depression   . Diverticulitis   . Diverticulosis   . Esophageal stricture   . GERD (gastroesophageal reflux disease)   . Hypertension    Prehypertension  . Obesity   . Spondylosis     Past Surgical History:  Procedure Laterality Date  . ESOPHAGEAL DILATION    . FINGER SURGERY Right    3rd digit; imbedded glass  . SHOULDER SURGERY Right    x 2    There were no vitals filed for this visit.  Subjective Assessment - 06/24/17 1305    Subjective  Reports that he has not had any L knee pain in two days.    Pertinent History  HTN, L5/S1 spondylosis (pars defects), R shoulder surgery x 2 (2006 latest)    Diagnostic tests  xray    Patient Stated Goals  to get rid of pain    Currently in Pain?  No/denies         [facility] PT Assessment - 06/24/17 0001      Assessment   Medical Diagnosis  acute pain of left knee; popliteal pain    Onset Date/Surgical Date  05/04/17    Next MD Visit  08/2017      Precautions   Precautions  None                   [facility] Adult PT Treatment/Exercise - 06/24/17 0001      Lumbar Exercises: Standing   Shoulder Extension  Strengthening;Both;20 reps;Limitations    Shoulder Extension Limitations  Pink XTS with L knee  flexion and core activatin    Other Standing Lumbar Exercises  B chop wood pink XTS x20 reps      Knee/Hip Exercises: Aerobic   Stationary Bike  L3 x15 min      Knee/Hip Exercises: Machines for Strengthening   Cybex Knee Flexion  20# LLE only     Cybex Leg Press  3 pl, seat 7 x20 reps      Knee/Hip Exercises: Standing   Heel Raises  Both;2 sets;10 reps    Forward Lunges  Left;20 reps;2 seconds    Terminal Knee Extension  Strengthening;Left;20 reps;Limitations    Terminal Knee Extension Limitations  Pink XTS    Step Down  Left;2 sets;10 reps;Hand Hold: 2;Step Height: 4"    Other Standing Knee Exercises  L heel dot 4" step x20 reps      Knee/Hip Exercises: Supine   Bridges  Strengthening;Both;20 reps;Limitations    Bridges Limitations  with green swissball    Single Leg Bridge  Strengthening;Left;20 reps  PT Long Term Goals - 06/17/17 1357      PT LONG TERM GOAL #1   Title  I with HEP for flexibility and lumbar stabilization    Time  6    Period  Weeks    Status  On-going      PT LONG TERM GOAL #2   Title  Patient able to perform ADLS without L knee pain.    Time  6    Period  Weeks    Status  On-going      PT LONG TERM GOAL #3   Title  Patient to demo 4+/5 or better L hamstrings strength to improve function.    Time  6    Period  Weeks    Status  On-going            Plan - 06/24/17 1633    Clinical Impression Statement  Patient tolerated today's treatment well with no complaints of L knee pain upon arrival. Patient guided through low back as well as knee strengthening exercises with focus on HS strengthening. More eccentric strengthening completed with machine strengthening. Patient reported increased fatigue following end of treatment.    Rehab Potential  Excellent    PT Frequency  2x / week    PT Duration  6 weeks    PT Treatment/Interventions  ADLs/Self Care Home Management;Cryotherapy;Electrical Stimulation;Moist  Heat;Ultrasound;Therapeutic exercise;Neuromuscular re-education;Patient/family education;Manual techniques;Dry needling;Taping    PT Next Visit Plan  cont with POC for knee/lumbar ther/ STW/manual to Left low back; piriformis stretching; lumbar stabilization    PT Home Exercise Plan  sciatic nerve glides; supine QL stretch    Consulted and Agree with Plan of Care  Patient       Patient will benefit from skilled therapeutic intervention in order to improve the following deficits and impairments:  Pain, Postural dysfunction, Decreased strength, Impaired flexibility  Visit Diagnosis: Radiculopathy, lumbar region  Acute pain of left knee     Problem List Patient Active Problem List   Diagnosis Date Noted  . OSA (obstructive sleep apnea) 04/20/2014  . Thrombocytopenia ([place]) 03/07/2014  . BPH (benign prostatic hyperplasia) 03/02/2014  . Hyperlipidemia 03/02/2014  . Cervical disc disorder with radiculopathy of cervical region 06/19/2013  . Vitamin D deficiency 06/19/2013  . History of diverticulitis of colon 12/08/2012  . Anxiety 11/09/2012  . History of asthma 11/09/2012  . Low back pain syndrome 11/09/2012  . Spondylolisthesis of lumbar region 11/09/2012  . Testosterone deficiency 05/09/2012  . Syncope 07/29/2011  . HTN (hypertension) 07/29/2011  . Overweight(278.02) 07/29/2011  . ESOPHAGEAL STRICTURE 01/10/2008  . DYSPHAGIA 01/10/2008  . GERD 01/09/2008  . PEPTIC STRICTURE 01/09/2008    Standley Brooking, PTA 06/24/2017, 4:44 PM  [facility] [address] [place], [place], [address] Phone: [phone]   Fax:  [phone]  Name: Roy Lawrence MRN: [id number] Date of Birth: 04/16/1970

## 2017-06-29 ENCOUNTER — Ambulatory Visit: Payer: PRIVATE HEALTH INSURANCE | Admitting: Physical Therapy

## 2017-06-29 ENCOUNTER — Encounter: Payer: Self-pay | Admitting: Physical Therapy

## 2017-06-29 DIAGNOSIS — M5416 Radiculopathy, lumbar region: Secondary | ICD-10-CM | POA: Diagnosis not present

## 2017-06-29 DIAGNOSIS — M25562 Pain in left knee: Secondary | ICD-10-CM

## 2017-06-29 NOTE — Therapy (Signed)
[facility] [place], [place], [address] Phone: [phone]   Fax:  [phone]  Physical Therapy Treatment  Patient Details  Name: Roy Lawrence MRN: [id number] Date of Birth: 1970-09-26 Referring Provider: Redge Gainer   Encounter Date: 06/29/2017  PT End of Session - 06/29/17 1351    Visit Number  5    Number of Visits  12    Date for PT Re-Evaluation  07/27/17    PT Start Time  4967    PT Stop Time  1429    PT Time Calculation (min)  40 min    Activity Tolerance  Patient tolerated treatment well    Behavior During Therapy  [facility] for tasks assessed/performed       Past Medical History:  Diagnosis Date  . Allergy    SEASONAL  . Anxiety   . Asthma   . Depression   . Diverticulitis   . Diverticulosis   . Esophageal stricture   . GERD (gastroesophageal reflux disease)   . Hypertension    Prehypertension  . Obesity   . Spondylosis     Past Surgical History:  Procedure Laterality Date  . ESOPHAGEAL DILATION    . FINGER SURGERY Right    3rd digit; imbedded glass  . SHOULDER SURGERY Right    x 2    There were no vitals filed for this visit.  Subjective Assessment - 06/29/17 1350    Subjective  Reports increased muscle fatigue following end of previous PT treatment.    Pertinent History  HTN, L5/S1 spondylosis (pars defects), R shoulder surgery x 2 (2006 latest)    Diagnostic tests  xray    Patient Stated Goals  to get rid of pain    Currently in Pain?  No/denies         [facility] PT Assessment - 06/29/17 0001      Assessment   Medical Diagnosis  acute pain of left knee; popliteal pain    Onset Date/Surgical Date  05/04/17    Next MD Visit  08/2017      Precautions   Precautions  None                   [facility] Adult PT Treatment/Exercise - 06/29/17 0001      Lumbar Exercises: Standing   Shoulder Extension  Strengthening;Both;20 reps;Limitations    Shoulder Extension Limitations  Pink  XTS with core activation    Other Standing Lumbar Exercises  B chop wood pink XTS x20 reps    Other Standing Lumbar Exercises  B chest punch pink XTS x20 reps      Knee/Hip Exercises: Aerobic   Stationary Bike  L3 x10 min      Knee/Hip Exercises: Machines for Strengthening   Cybex Knee Flexion  50# 2x10 reps    Cybex Leg Press  3 pl, seat 7 x20 reps      Knee/Hip Exercises: Standing   Heel Raises  Both;2 sets;10 reps    Forward Lunges  Left;20 reps;2 seconds half lunge    Hip Abduction  Stengthening;Right;2 sets;10 reps;Knee straight;Limitations    Abduction Limitations  red theraband    Hip Extension  Stengthening;Right;2 sets;10 reps;Knee straight;Limitations    Extension Limitations  red theraband    Forward Step Up  Left;2 sets;10 reps;Hand Hold: 2;Step Height: 6"    Step Down  Left;2 sets;10 reps;Hand Hold: 2;Step Height: 4"    Wall Squat  2 sets;10 reps    Walking with  Sports Cord  Pink XTS 4D walk x10 reps each    Other Standing Knee Exercises  L heel dot 4" step x20 reps      Knee/Hip Exercises: Supine   Bridges  Strengthening;Both;20 reps;Limitations    Bridges Limitations  with green swissball                  PT Long Term Goals - 06/17/17 1357      PT LONG TERM GOAL #1   Title  I with HEP for flexibility and lumbar stabilization    Time  6    Period  Weeks    Status  On-going      PT LONG TERM GOAL #2   Title  Patient able to perform ADLS without L knee pain.    Time  6    Period  Weeks    Status  On-going      PT LONG TERM GOAL #3   Title  Patient to demo 4+/5 or better L hamstrings strength to improve function.    Time  6    Period  Weeks    Status  On-going            Plan - 06/29/17 1507    Clinical Impression Statement  Patient tolerated today's treatment well with continued reports of muscle fatigue during treatment. Muscle fatigue reports mostly following heel dot exercises today. No complaints of pain prior to, during or  following treatment today. Good technique noted with lunge activities and step activities. Core activation and posture VCs provided during treatment.     Rehab Potential  Excellent    PT Frequency  2x / week    PT Duration  6 weeks    PT Treatment/Interventions  ADLs/Self Care Home Management;Cryotherapy;Electrical Stimulation;Moist Heat;Ultrasound;Therapeutic exercise;Neuromuscular re-education;Patient/family education;Manual techniques;Dry needling;Taping    PT Next Visit Plan  cont with POC for knee/lumbar ther/ STW/manual to Left low back; piriformis stretching; lumbar stabilization    PT Home Exercise Plan  sciatic nerve glides; supine QL stretch    Consulted and Agree with Plan of Care  Patient       Patient will benefit from skilled therapeutic intervention in order to improve the following deficits and impairments:  Pain, Postural dysfunction, Decreased strength, Impaired flexibility  Visit Diagnosis: Radiculopathy, lumbar region  Acute pain of left knee     Problem List Patient Active Problem List   Diagnosis Date Noted  . OSA (obstructive sleep apnea) 04/20/2014  . Thrombocytopenia ([place]) 03/07/2014  . BPH (benign prostatic hyperplasia) 03/02/2014  . Hyperlipidemia 03/02/2014  . Cervical disc disorder with radiculopathy of cervical region 06/19/2013  . Vitamin D deficiency 06/19/2013  . History of diverticulitis of colon 12/08/2012  . Anxiety 11/09/2012  . History of asthma 11/09/2012  . Low back pain syndrome 11/09/2012  . Spondylolisthesis of lumbar region 11/09/2012  . Testosterone deficiency 05/09/2012  . Syncope 07/29/2011  . HTN (hypertension) 07/29/2011  . Overweight(278.02) 07/29/2011  . ESOPHAGEAL STRICTURE 01/10/2008  . DYSPHAGIA 01/10/2008  . GERD 01/09/2008  . PEPTIC STRICTURE 01/09/2008    Standley Brooking, PTA 06/29/2017, 4:03 PM  [facility] [address] [place], [place], [address] Phone: [phone]    Fax:  [phone]  Name: Roy Lawrence MRN: [id number] Date of Birth: [birth year]

## 2017-07-01 ENCOUNTER — Ambulatory Visit: Payer: PRIVATE HEALTH INSURANCE | Admitting: Physical Therapy

## 2017-07-01 ENCOUNTER — Encounter: Payer: Self-pay | Admitting: Physical Therapy

## 2017-07-01 DIAGNOSIS — M5416 Radiculopathy, lumbar region: Secondary | ICD-10-CM

## 2017-07-01 DIAGNOSIS — M25562 Pain in left knee: Secondary | ICD-10-CM

## 2017-07-01 NOTE — Therapy (Signed)
[facility] [place], [place], [address] Phone: [phone]   Fax:  [phone]  Physical Therapy Treatment  Patient Details  Name: Roy Lawrence MRN: [id number] Date of Birth: 02-Oct-1970 Referring Provider: Redge Gainer   Encounter Date: 07/01/2017  PT End of Session - 07/01/17 1355    Visit Number  6    Number of Visits  12    Date for PT Re-Evaluation  07/27/17    PT Start Time  1346    PT Stop Time  1428    PT Time Calculation (min)  42 min    Activity Tolerance  Patient tolerated treatment well    Behavior During Therapy  [facility] for tasks assessed/performed       Past Medical History:  Diagnosis Date  . Allergy    SEASONAL  . Anxiety   . Asthma   . Depression   . Diverticulitis   . Diverticulosis   . Esophageal stricture   . GERD (gastroesophageal reflux disease)   . Hypertension    Prehypertension  . Obesity   . Spondylosis     Past Surgical History:  Procedure Laterality Date  . ESOPHAGEAL DILATION    . FINGER SURGERY Right    3rd digit; imbedded glass  . SHOULDER SURGERY Right    x 2    There were no vitals filed for this visit.  Subjective Assessment - 07/01/17 1343    Subjective  Denies any pain today.    Pertinent History  HTN, L5/S1 spondylosis (pars defects), R shoulder surgery x 2 (2006 latest)    Diagnostic tests  xray    Patient Stated Goals  to get rid of pain    Currently in Pain?  No/denies         [facility] PT Assessment - 07/01/17 0001      Assessment   Medical Diagnosis  acute pain of left knee; popliteal pain    Onset Date/Surgical Date  05/04/17    Next MD Visit  08/2017      Precautions   Precautions  None                   [facility] Adult PT Treatment/Exercise - 07/01/17 0001      Lumbar Exercises: Standing   Other Standing Lumbar Exercises  B chop wood pink XTS x20 reps      Knee/Hip Exercises: Aerobic   Stationary Bike  L4 x10 min    Elliptical  L5, R5  x3 min      Knee/Hip Exercises: Machines for Strengthening   Cybex Knee Extension  70# x20 reps with eccentric control training    Cybex Knee Flexion  60# x25 reps eccentric control    Cybex Leg Press  3 pl, seat 7 x20 reps concentric and eccentric strengthening of LLE      Knee/Hip Exercises: Standing   Heel Raises  Both;2 sets;10 reps B toe raise x20 reps    Forward Lunges  Left;15 reps with 10# kettlebell    Side Lunges  Left;15 reps with 10# kettlebell    Hip Abduction  Stengthening;Right;3 sets;10 reps;Knee straight;Limitations    Abduction Limitations  red theraband    Hip Extension  Stengthening;Right;3 sets;10 reps;Knee straight;Limitations    Extension Limitations  red theraband    Lateral Step Up  Left;3 sets;10 reps;Hand Hold: 2;Step Height: 8"    Forward Step Up  Left;3 sets;10 reps;Hand Hold: 2;Step Height: 8"    Step Down  Left;3  sets;10 reps;Hand Hold: 2;Step Height: 4"    SLS  LLE SLS for RLE balance pod touches x8 reps                  PT Long Term Goals - 07/01/17 1358      PT LONG TERM GOAL #1   Title  I with HEP for flexibility and lumbar stabilization    Time  6    Period  Weeks    Status  Achieved      PT LONG TERM GOAL #2   Title  Patient able to perform ADLS without L knee pain.    Time  6    Period  Weeks    Status  Achieved      PT LONG TERM GOAL #3   Title  Patient to demo 4+/5 or better L hamstrings strength to improve function.    Time  6    Period  Weeks    Status  On-going            Plan - 07/01/17 1435    Clinical Impression Statement  Patient tolerated today's treatment well with no reports of any increased pain only muscle fatigue. Patient denied both L HS or LBP during treatment. Good technique with the progression of exercises noted as well. Patient able to achieve goals for HEP and pain in L knee.    Rehab Potential  Excellent    PT Frequency  2x / week    PT Duration  6 weeks    PT Treatment/Interventions   ADLs/Self Care Home Management;Cryotherapy;Electrical Stimulation;Moist Heat;Ultrasound;Therapeutic exercise;Neuromuscular re-education;Patient/family education;Manual techniques;Dry needling;Taping    PT Next Visit Plan  cont with POC for knee/lumbar ther/ STW/manual to Left low back; piriformis stretching; lumbar stabilization    PT Home Exercise Plan  sciatic nerve glides; supine QL stretch    Consulted and Agree with Plan of Care  Patient       Patient will benefit from skilled therapeutic intervention in order to improve the following deficits and impairments:  Pain, Postural dysfunction, Decreased strength, Impaired flexibility  Visit Diagnosis: Radiculopathy, lumbar region  Acute pain of left knee     Problem List Patient Active Problem List   Diagnosis Date Noted  . OSA (obstructive sleep apnea) 04/20/2014  . Thrombocytopenia ([place]) 03/07/2014  . BPH (benign prostatic hyperplasia) 03/02/2014  . Hyperlipidemia 03/02/2014  . Cervical disc disorder with radiculopathy of cervical region 06/19/2013  . Vitamin D deficiency 06/19/2013  . History of diverticulitis of colon 12/08/2012  . Anxiety 11/09/2012  . History of asthma 11/09/2012  . Low back pain syndrome 11/09/2012  . Spondylolisthesis of lumbar region 11/09/2012  . Testosterone deficiency 05/09/2012  . Syncope 07/29/2011  . HTN (hypertension) 07/29/2011  . Overweight(278.02) 07/29/2011  . ESOPHAGEAL STRICTURE 01/10/2008  . DYSPHAGIA 01/10/2008  . GERD 01/09/2008  . PEPTIC STRICTURE 01/09/2008    Standley Brooking, PTA 07/01/2017, 2:47 PM  [facility] [address] [place], [place], [address] Phone: [phone]   Fax:  [phone]  Name: DAVIUS GOUDEAU MRN: [id number] Date of Birth: Oct 08, 1970

## 2017-07-05 ENCOUNTER — Telehealth: Payer: Self-pay | Admitting: Family Medicine

## 2017-07-05 ENCOUNTER — Ambulatory Visit: Payer: PRIVATE HEALTH INSURANCE | Attending: Family Medicine | Admitting: *Deleted

## 2017-07-05 DIAGNOSIS — M25562 Pain in left knee: Secondary | ICD-10-CM | POA: Diagnosis present

## 2017-07-05 DIAGNOSIS — M5416 Radiculopathy, lumbar region: Secondary | ICD-10-CM | POA: Diagnosis not present

## 2017-07-05 NOTE — Telephone Encounter (Signed)
What is the name of the medication? Xanax 0.5mg   Which pharmacy would you like this sent to? [facility]   Patient notified that their request is being sent to the clinical staff for review.

## 2017-07-05 NOTE — Telephone Encounter (Signed)
Please check out the discrepancies with the refill dates on the Xanax prescription????  And correct if necessary

## 2017-07-05 NOTE — Telephone Encounter (Signed)
Pt has RX

## 2017-07-05 NOTE — Telephone Encounter (Signed)
Patient had xanax script filled on May 2,2019 but original script was written for 06-23-17.    Please review and advise on refill.

## 2017-07-05 NOTE — Therapy (Signed)
[facility] [place], [place], [address] Phone: [phone]   Fax:  [phone]  Physical Therapy Treatment  Patient Details  Name: Roy Lawrence MRN: [id number] Date of Birth: April 13, 1970 Referring Provider: Redge Gainer   Encounter Date: 07/05/2017  PT End of Session - 07/05/17 1330    Visit Number  7    Number of Visits  12    Date for PT Re-Evaluation  07/27/17    PT Start Time  1300    PT Stop Time  1346    PT Time Calculation (min)  46 min       Past Medical History:  Diagnosis Date  . Allergy    SEASONAL  . Anxiety   . Asthma   . Depression   . Diverticulitis   . Diverticulosis   . Esophageal stricture   . GERD (gastroesophageal reflux disease)   . Hypertension    Prehypertension  . Obesity   . Spondylosis     Past Surgical History:  Procedure Laterality Date  . ESOPHAGEAL DILATION    . FINGER SURGERY Right    3rd digit; imbedded glass  . SHOULDER SURGERY Right    x 2    There were no vitals filed for this visit.  Subjective Assessment - 07/05/17 1343    Subjective  LT knee HS is sore 1-2/10    Pertinent History  HTN, L5/S1 spondylosis (pars defects), R shoulder surgery x 2 (2006 latest)    Diagnostic tests  xray    Patient Stated Goals  to get rid of pain    Currently in Pain?  Yes    Pain Score  2     Pain Location  Knee    Pain Orientation  Left    Pain Descriptors / Indicators  Burning    Pain Type  Acute pain    Pain Onset  More than a month ago                       [facility] Adult PT Treatment/Exercise - 07/05/17 0001      Lumbar Exercises: Standing   Other Standing Lumbar Exercises  B chop wood pink XTS x20 reps  each way      Knee/Hip Exercises: Aerobic   Stationary Bike  L4 x10 min    Elliptical  L5, R5 x3 min      Knee/Hip Exercises: Machines for Strengthening   Cybex Knee Extension  80# x20 reps with eccentric control training    Cybex Knee Flexion  60#  x20 reps eccentric control    Cybex Leg Press  3 pl, seat 7 x20 reps LT LE only      Knee/Hip Exercises: Standing   Forward Lunges  Left;15 reps with 10# kettlebell    Side Lunges  Left;15 reps with 10# kettlebell    Hip Abduction  Stengthening;Right;3 sets;10 reps;Knee straight;Limitations    Abduction Limitations  red theraband    Hip Extension  Stengthening;Right;3 sets;10 reps;Knee straight;Limitations    Extension Limitations  red theraband    Lateral Step Up  Left;10 reps;Hand Hold: 2;Step Height: 8";2 sets    Forward Step Up  Left;10 reps;Hand Hold: 2;Step Height: 8";2 sets    Step Down  Left;10 reps;Hand Hold: 2;Step Height: 4";2 sets    SLS  LLE SLS for LLE balance,  pod touches 2x8 reps RT LE  PT Long Term Goals - 07/01/17 1358      PT LONG TERM GOAL #1   Title  I with HEP for flexibility and lumbar stabilization    Time  6    Period  Weeks    Status  Achieved      PT LONG TERM GOAL #2   Title  Patient able to perform ADLS without L knee pain.    Time  6    Period  Weeks    Status  Achieved      PT LONG TERM GOAL #3   Title  Patient to demo 4+/5 or better L hamstrings strength to improve function.    Time  6    Period  Weeks    Status  On-going            Plan - 07/05/17 1511    Clinical Impression Statement  Pt arrived today doing fairly well, but reports having some LT HS soreness after last Rx. He reports that it is not bad today. He was able to complete all therex/ and act.'s without complaints    Clinical Decision Making  Low    Rehab Potential  Excellent    PT Frequency  2x / week    PT Duration  6 weeks    PT Treatment/Interventions  ADLs/Self Care Home Management;Cryotherapy;Electrical Stimulation;Moist Heat;Ultrasound;Therapeutic exercise;Neuromuscular re-education;Patient/family education;Manual techniques;Dry needling;Taping    PT Next Visit Plan  cont with POC for knee/lumbar ther/ STW/manual to Left low back;  piriformis stretching; lumbar stabilization    PT Home Exercise Plan  sciatic nerve glides; supine QL stretch    Consulted and Agree with Plan of Care  Patient       Patient will benefit from skilled therapeutic intervention in order to improve the following deficits and impairments:  Pain, Postural dysfunction, Decreased strength, Impaired flexibility  Visit Diagnosis: Radiculopathy, lumbar region  Acute pain of left knee     Problem List Patient Active Problem List   Diagnosis Date Noted  . OSA (obstructive sleep apnea) 04/20/2014  . Thrombocytopenia ([place]) 03/07/2014  . BPH (benign prostatic hyperplasia) 03/02/2014  . Hyperlipidemia 03/02/2014  . Cervical disc disorder with radiculopathy of cervical region 06/19/2013  . Vitamin D deficiency 06/19/2013  . History of diverticulitis of colon 12/08/2012  . Anxiety 11/09/2012  . History of asthma 11/09/2012  . Low back pain syndrome 11/09/2012  . Spondylolisthesis of lumbar region 11/09/2012  . Testosterone deficiency 05/09/2012  . Syncope 07/29/2011  . HTN (hypertension) 07/29/2011  . Overweight(278.02) 07/29/2011  . ESOPHAGEAL STRICTURE 01/10/2008  . DYSPHAGIA 01/10/2008  . GERD 01/09/2008  . PEPTIC STRICTURE 01/09/2008    Ulah Olmo,CHRIS, PTA 07/05/2017, 4:22 PM  [facility] [place], [place], [address] Phone: [phone]   Fax:  [phone]  Name: Roy Lawrence MRN: [id number] Date of Birth: [birth year]

## 2017-07-08 ENCOUNTER — Encounter: Payer: Self-pay | Admitting: Physical Therapy

## 2017-07-08 ENCOUNTER — Ambulatory Visit: Payer: PRIVATE HEALTH INSURANCE | Admitting: Physical Therapy

## 2017-07-08 DIAGNOSIS — M5416 Radiculopathy, lumbar region: Secondary | ICD-10-CM | POA: Diagnosis not present

## 2017-07-08 DIAGNOSIS — M25562 Pain in left knee: Secondary | ICD-10-CM

## 2017-07-08 NOTE — Therapy (Signed)
[facility] [place], [place], [address] Phone: [phone]   Fax:  [phone]  Physical Therapy Treatment  Patient Details  Name: Roy Lawrence MRN: [id number] Date of Birth: 23-Apr-1970 Referring Provider: Redge Gainer   Encounter Date: 07/08/2017  PT End of Session - 07/08/17 1540    Visit Number  8    Number of Visits  12    Date for PT Re-Evaluation  07/27/17    PT Start Time  0100    PT Stop Time  0153    PT Time Calculation (min)  53 min    Activity Tolerance  Patient tolerated treatment well    Behavior During Therapy  [facility] for tasks assessed/performed       Past Medical History:  Diagnosis Date  . Allergy    SEASONAL  . Anxiety   . Asthma   . Depression   . Diverticulitis   . Diverticulosis   . Esophageal stricture   . GERD (gastroesophageal reflux disease)   . Hypertension    Prehypertension  . Obesity   . Spondylosis     Past Surgical History:  Procedure Laterality Date  . ESOPHAGEAL DILATION    . FINGER SURGERY Right    3rd digit; imbedded glass  . SHOULDER SURGERY Right    x 2    There were no vitals filed for this visit.  Subjective Assessment - 07/08/17 1532    Subjective  Hamstring hurts.    Currently in Pain?  Yes    Pain Score  2     Pain Orientation  Left    Pain Descriptors / Indicators  Burning                       [facility] Adult PT Treatment/Exercise - 07/08/17 0001      Exercises   Exercises  Knee/Hip      Lumbar Exercises: Aerobic   Stationary Bike  level 2 x 10 minutes.    Elliptical  L4/4 x 5 minutes.      Modalities   Modalities  Electrical Stimulation;Ultrasound      Electrical Stimulation   Electrical Stimulation Location  Left popliteal fossa region.    Electrical Stimulation Action  Pre-mod. at 80-150 Hz x 20 minutes.    Electrical Stimulation Goals  Pain      Ultrasound   Ultrasound Location  Left popliteal fossa.    Ultrasound  Parameters  Combo e'stim/U/S at 1.50 W/CM2 x 8 minutes.    Ultrasound Goals  Pain                  PT Long Term Goals - 07/01/17 1358      PT LONG TERM GOAL #1   Title  I with HEP for flexibility and lumbar stabilization    Time  6    Period  Weeks    Status  Achieved      PT LONG TERM GOAL #2   Title  Patient able to perform ADLS without L knee pain.    Time  6    Period  Weeks    Status  Achieved      PT LONG TERM GOAL #3   Title  Patient to demo 4+/5 or better L hamstrings strength to improve function.    Time  6    Period  Weeks    Status  On-going            Plan -  07/08/17 1537    Clinical Impression Statement  CC is pain reported in left popliteal fossa/distal hamstring region.  He did well with treatment today.      PT Treatment/Interventions  ADLs/Self Care Home Management;Cryotherapy;Electrical Stimulation;Moist Heat;Ultrasound;Therapeutic exercise;Neuromuscular re-education;Patient/family education;Manual techniques;Dry needling;Taping    PT Next Visit Plan  cont with POC for knee/lumbar ther/ STW/manual to Left low back; piriformis stretching; lumbar stabilization    PT Home Exercise Plan  sciatic nerve glides; supine QL stretch    Consulted and Agree with Plan of Care  Patient       Patient will benefit from skilled therapeutic intervention in order to improve the following deficits and impairments:  Pain, Postural dysfunction, Decreased strength, Impaired flexibility  Visit Diagnosis: Acute pain of left knee     Problem List Patient Active Problem List   Diagnosis Date Noted  . OSA (obstructive sleep apnea) 04/20/2014  . Thrombocytopenia ([place]) 03/07/2014  . BPH (benign prostatic hyperplasia) 03/02/2014  . Hyperlipidemia 03/02/2014  . Cervical disc disorder with radiculopathy of cervical region 06/19/2013  . Vitamin D deficiency 06/19/2013  . History of diverticulitis of colon 12/08/2012  . Anxiety 11/09/2012  . History of asthma  11/09/2012  . Low back pain syndrome 11/09/2012  . Spondylolisthesis of lumbar region 11/09/2012  . Testosterone deficiency 05/09/2012  . Syncope 07/29/2011  . HTN (hypertension) 07/29/2011  . Overweight(278.02) 07/29/2011  . ESOPHAGEAL STRICTURE 01/10/2008  . DYSPHAGIA 01/10/2008  . GERD 01/09/2008  . PEPTIC STRICTURE 01/09/2008    Courtnei Ruddell, [place] MPT 07/08/2017, 3:41 PM  [facility] [address] [place], [place], [address] Phone: [phone]   Fax:  [phone]  Name: Roy Lawrence MRN: [id number] Date of Birth: [birth year]

## 2017-07-12 ENCOUNTER — Encounter: Payer: Self-pay | Admitting: Physical Therapy

## 2017-07-12 ENCOUNTER — Ambulatory Visit: Payer: PRIVATE HEALTH INSURANCE | Admitting: Physical Therapy

## 2017-07-12 DIAGNOSIS — M25562 Pain in left knee: Secondary | ICD-10-CM

## 2017-07-12 DIAGNOSIS — M5416 Radiculopathy, lumbar region: Secondary | ICD-10-CM | POA: Diagnosis not present

## 2017-07-12 NOTE — Therapy (Signed)
[facility] [place], [place], [address] Phone: [phone]   Fax:  [phone]  Physical Therapy Treatment  Patient Details  Name: Roy Lawrence MRN: [id number] Date of Birth: 1970-08-11 Referring Provider: Redge Gainer   Encounter Date: 07/12/2017  PT End of Session - 07/12/17 0937    Visit Number  9    Number of Visits  12    Date for PT Re-Evaluation  07/27/17    PT Start Time  0900    PT Stop Time  0944    PT Time Calculation (min)  44 min    Activity Tolerance  Patient tolerated treatment well    Behavior During Therapy  [facility] for tasks assessed/performed       Past Medical History:  Diagnosis Date  . Allergy    SEASONAL  . Anxiety   . Asthma   . Depression   . Diverticulitis   . Diverticulosis   . Esophageal stricture   . GERD (gastroesophageal reflux disease)   . Hypertension    Prehypertension  . Obesity   . Spondylosis     Past Surgical History:  Procedure Laterality Date  . ESOPHAGEAL DILATION    . FINGER SURGERY Right    3rd digit; imbedded glass  . SHOULDER SURGERY Right    x 2    There were no vitals filed for this visit.  Subjective Assessment - 07/12/17 0906    Subjective  Patient reported doing too much last week and it caused some discomfort, yet last treatment helped and no less discomfort today    Pertinent History  HTN, L5/S1 spondylosis (pars defects), R shoulder surgery x 2 (2006 latest)    Diagnostic tests  xray    Patient Stated Goals  to get rid of pain    Currently in Pain?  Yes    Pain Score  1     Pain Location  Knee    Pain Orientation  Left    Pain Descriptors / Indicators  Discomfort    Pain Type  Acute pain    Pain Onset  More than a month ago    Pain Frequency  Intermittent    Aggravating Factors   increased activity    Pain Relieving Factors  at rest                       [facility] Adult PT Treatment/Exercise - 07/12/17 0001      Lumbar  Exercises: Aerobic   Stationary Bike  level 2 x 10 minutes.      Acupuncturist Location  Left popliteal fossa region.    Electrical Stimulation Action  premod    Electrical Stimulation Parameters  80-150hz  x7min    Electrical Stimulation Goals  Pain      Ultrasound   Ultrasound Location  left popliteal fossa    Ultrasound Parameters  combo US/ES 1.5w/cm2/100%/48mhx x58min      Manual Therapy   Manual Therapy  Soft tissue mobilization    Manual therapy comments  manual STW to left popliteal fossa, HS and calf                  PT Long Term Goals - 07/01/17 1358      PT LONG TERM GOAL #1   Title  I with HEP for flexibility and lumbar stabilization    Time  6    Period  Weeks    Status  Achieved      PT LONG TERM GOAL #2   Title  Patient able to perform ADLS without L knee pain.    Time  6    Period  Weeks    Status  Achieved      PT LONG TERM GOAL #3   Title  Patient to demo 4+/5 or better L hamstrings strength to improve function.    Time  6    Period  Weeks    Status  On-going            Plan - 07/12/17 0940    Clinical Impression Statement  Patient tolerated treatment well today. Patient has felt relief after the last few visits. Patient has most of his pain in left post knee/HS with increased activity or bening knee. Patient progressing towards strength goal. Patient may want to be put on hold after last treatment.    Rehab Potential  Excellent    PT Frequency  2x / week    PT Duration  6 weeks    PT Treatment/Interventions  ADLs/Self Care Home Management;Cryotherapy;Electrical Stimulation;Moist Heat;Ultrasound;Therapeutic exercise;Neuromuscular re-education;Patient/family education;Manual techniques;Dry needling;Taping    PT Next Visit Plan  cont with POC combo/STW then possible hold after next treatment    Consulted and Agree with Plan of Care  Patient       Patient will benefit from skilled therapeutic  intervention in order to improve the following deficits and impairments:  Pain, Postural dysfunction, Decreased strength, Impaired flexibility  Visit Diagnosis: Acute pain of left knee     Problem List Patient Active Problem List   Diagnosis Date Noted  . OSA (obstructive sleep apnea) 04/20/2014  . Thrombocytopenia ([place]) 03/07/2014  . BPH (benign prostatic hyperplasia) 03/02/2014  . Hyperlipidemia 03/02/2014  . Cervical disc disorder with radiculopathy of cervical region 06/19/2013  . Vitamin D deficiency 06/19/2013  . History of diverticulitis of colon 12/08/2012  . Anxiety 11/09/2012  . History of asthma 11/09/2012  . Low back pain syndrome 11/09/2012  . Spondylolisthesis of lumbar region 11/09/2012  . Testosterone deficiency 05/09/2012  . Syncope 07/29/2011  . HTN (hypertension) 07/29/2011  . Overweight(278.02) 07/29/2011  . ESOPHAGEAL STRICTURE 01/10/2008  . DYSPHAGIA 01/10/2008  . GERD 01/09/2008  . PEPTIC STRICTURE 01/09/2008    Maddilynn Esperanza P, PTA 07/12/2017, 9:53 AM  [facility] [place], [place], [address] Phone: [phone]   Fax:  [phone]  Name: Roy Lawrence MRN: [id number] Date of Birth: Oct 30, 1970

## 2017-07-15 ENCOUNTER — Ambulatory Visit: Payer: PRIVATE HEALTH INSURANCE | Admitting: *Deleted

## 2017-07-15 DIAGNOSIS — M25562 Pain in left knee: Secondary | ICD-10-CM

## 2017-07-15 DIAGNOSIS — M5416 Radiculopathy, lumbar region: Secondary | ICD-10-CM

## 2017-07-15 NOTE — Therapy (Signed)
[facility] [place], [place], [address] Phone: [phone]   Fax:  [phone]  Physical Therapy Treatment  Patient Details  Name: Roy Lawrence MRN: [id number] Date of Birth: 08-12-1970 Referring Provider: Redge Gainer   Encounter Date: 07/15/2017  PT End of Session - 07/15/17 0950    Visit Number  10    Number of Visits  12    Date for PT Re-Evaluation  07/27/17    PT Start Time  0900    PT Stop Time  0951    PT Time Calculation (min)  51 min    Activity Tolerance  Patient tolerated treatment well    Behavior During Therapy  [facility] for tasks assessed/performed       Past Medical History:  Diagnosis Date  . Allergy    SEASONAL  . Anxiety   . Asthma   . Depression   . Diverticulitis   . Diverticulosis   . Esophageal stricture   . GERD (gastroesophageal reflux disease)   . Hypertension    Prehypertension  . Obesity   . Spondylosis     Past Surgical History:  Procedure Laterality Date  . ESOPHAGEAL DILATION    . FINGER SURGERY Right    3rd digit; imbedded glass  . SHOULDER SURGERY Right    x 2    There were no vitals filed for this visit.  Subjective Assessment - 07/15/17 0908    Subjective  Rxs are helping with HS pain. Doing better. Just sore now    Pertinent History  HTN, L5/S1 spondylosis (pars defects), R shoulder surgery x 2 (2006 latest)    Diagnostic tests  xray    Patient Stated Goals  to get rid of pain    Currently in Pain?  Yes    Pain Score  1     Pain Location  Knee    Pain Orientation  Left    Pain Descriptors / Indicators  Discomfort    Pain Onset  More than a month ago    Pain Frequency  Intermittent                       [facility] Adult PT Treatment/Exercise - 07/15/17 0001      Exercises   Exercises  Knee/Hip      Lumbar Exercises: Aerobic   Stationary Bike  level 3 x 11  minutes.  3 miles      Modalities   Modalities  Electrical Stimulation;Ultrasound       Acupuncturist Stimulation Location  Left popliteal fossa region. Premod x 15 mins 80-_0     Electrical Stimulation Goals  Pain      Ultrasound   Ultrasound Location  LT popliteal fossa/ distal HS    Ultrasound Parameters  Prone  pos.  [place] @ 1.5 w/cm2 x 10 mins    Ultrasound Goals  Pain      Manual Therapy   Manual Therapy  Soft tissue mobilization    Manual therapy comments  manual STW to left popliteal fossa, HS and calf                  PT Long Term Goals - 07/01/17 1358      PT LONG TERM GOAL #1   Title  I with HEP for flexibility and lumbar stabilization    Time  6    Period  Weeks    Status  Achieved  PT LONG TERM GOAL #2   Title  Patient able to perform ADLS without L knee pain.    Time  6    Period  Weeks    Status  Achieved      PT LONG TERM GOAL #3   Title  Patient to demo 4+/5 or better L hamstrings strength to improve function.    Time  6    Period  Weeks    Status  On-going            Plan - 07/15/17 0954    Clinical Impression Statement  Pt arrives today doing fairly well and reports that Rxs are helping and HS feels much better. Overall Pt feels 90-100% better with decreased pain. He has met all LTGs except Strength. Pt will be OOT next week and will be put on hold. He has 2 visits left and wants to see if he has any problems next week.    Clinical Presentation  Stable    Clinical Decision Making  Low    Rehab Potential  Excellent    PT Frequency  2x / week    PT Duration  6 weeks    PT Treatment/Interventions  ADLs/Self Care Home Management;Cryotherapy;Electrical Stimulation;Moist Heat;Ultrasound;Therapeutic exercise;Neuromuscular re-education;Patient/family education;Manual techniques;Dry needling;Taping    PT Next Visit Plan  cont with POC combo/STW then possible hold after next treatment    Consulted and Agree with Plan of Care  Patient       Patient will benefit from skilled therapeutic intervention in  order to improve the following deficits and impairments:  Pain, Postural dysfunction, Decreased strength, Impaired flexibility  Visit Diagnosis: Acute pain of left knee  Radiculopathy, lumbar region     Problem List Patient Active Problem List   Diagnosis Date Noted  . OSA (obstructive sleep apnea) 04/20/2014  . Thrombocytopenia ([place]) 03/07/2014  . BPH (benign prostatic hyperplasia) 03/02/2014  . Hyperlipidemia 03/02/2014  . Cervical disc disorder with radiculopathy of cervical region 06/19/2013  . Vitamin D deficiency 06/19/2013  . History of diverticulitis of colon 12/08/2012  . Anxiety 11/09/2012  . History of asthma 11/09/2012  . Low back pain syndrome 11/09/2012  . Spondylolisthesis of lumbar region 11/09/2012  . Testosterone deficiency 05/09/2012  . Syncope 07/29/2011  . HTN (hypertension) 07/29/2011  . Overweight(278.02) 07/29/2011  . ESOPHAGEAL STRICTURE 01/10/2008  . DYSPHAGIA 01/10/2008  . GERD 01/09/2008  . PEPTIC STRICTURE 01/09/2008    RAMSEUR,CHRIS, PTA 07/15/2017, 10:05 AM  [facility] [place], [place], [address] Phone: [phone]   Fax:  [phone]  Name: Roy Lawrence MRN: [id number] Date of Birth: 27-Jul-1970

## 2017-09-04 ENCOUNTER — Other Ambulatory Visit: Payer: Self-pay | Admitting: Family Medicine

## 2017-09-26 ENCOUNTER — Other Ambulatory Visit: Payer: Self-pay | Admitting: Family Medicine

## 2017-09-27 NOTE — Telephone Encounter (Signed)
Last seen 06/09/17  DWM

## 2017-09-28 ENCOUNTER — Ambulatory Visit: Payer: PRIVATE HEALTH INSURANCE | Admitting: Family Medicine

## 2017-09-28 ENCOUNTER — Encounter: Payer: Self-pay | Admitting: Family Medicine

## 2017-09-28 VITALS — BP 122/83 | HR 73 | Temp 97.1°F | Ht 69.0 in | Wt 258.0 lb

## 2017-09-28 DIAGNOSIS — R1030 Lower abdominal pain, unspecified: Secondary | ICD-10-CM

## 2017-09-28 DIAGNOSIS — N410 Acute prostatitis: Secondary | ICD-10-CM | POA: Diagnosis not present

## 2017-09-28 LAB — URINALYSIS
Bilirubin, UA: NEGATIVE
Glucose, UA: NEGATIVE
Ketones, UA: NEGATIVE
Leukocytes, UA: NEGATIVE
Nitrite, UA: NEGATIVE
Protein, UA: NEGATIVE
RBC, UA: NEGATIVE
Specific Gravity, UA: 1.025 (ref 1.005–1.030)
Urobilinogen, Ur: 0.2 mg/dL (ref 0.2–1.0)
pH, UA: 6 (ref 5.0–7.5)

## 2017-09-28 MED ORDER — SILDENAFIL CITRATE 100 MG PO TABS: 100.0000 mg | ORAL_TABLET | Freq: Every day | ORAL | 5 refills | Status: DC | PRN

## 2017-09-28 MED ORDER — CIPROFLOXACIN HCL 500 MG PO TABS: 500.0000 mg | ORAL_TABLET | Freq: Two times a day (BID) | ORAL | 0 refills | Status: AC

## 2017-09-29 LAB — URINE CULTURE: Organism ID, Bacteria: NO GROWTH

## 2017-10-04 ENCOUNTER — Encounter: Payer: Self-pay | Admitting: Family Medicine

## 2017-10-04 NOTE — Progress Notes (Signed)
Subjective:  Patient ID: Roy Lawrence, male    DOB: 1970-12-06  Age: 47 y.o. MRN: [id number]  CC: Abdominal Pain (pt here today c/o abdominal pain x 3 days which is relieved slightly after urinating)   HPI Roy Lawrence presents for increasing pain in mid to lower abd bilaterally  Getting worse over the last three days.  Some nausea. No vomiting. BMs increased in frequency intermittently combined with days wihout BM. Not watery.  Depression screen [facility] 2/9 06/09/2017 04/21/2017 10/22/2016  Decreased Interest 0 0 0  Down, Depressed, Hopeless 0 0 1  PHQ - 2 Score 0 0 1    History Roy Lawrence has a past medical history of Allergy, Anxiety, Asthma, Depression, Diverticulitis, Diverticulosis, Esophageal stricture, GERD (gastroesophageal reflux disease), Hypertension, Obesity, and Spondylosis.   He has a past surgical history that includes Shoulder surgery (Right); Finger surgery (Right); and Esophageal dilation.   His family history includes Asthma in his mother; Dementia in his maternal grandfather and maternal grandmother; Diabetes in his father; Diverticulitis in his mother; GER disease in his mother; Berenice Primas' disease in his father; Hodgkin's lymphoma in his father; Hyperlipidemia in his mother; Hypertension in his maternal grandfather, maternal grandmother, and mother; Thyroid disease in his father and sister.He reports that he has never smoked. He has never used smokeless tobacco. He reports that he drinks alcohol. He reports that he does not use drugs.    ROS Review of Systems  Constitutional: Negative for chills, diaphoresis, fever and unexpected weight change.  HENT: Negative for rhinorrhea and trouble swallowing.   Respiratory: Negative for cough, chest tightness and shortness of breath.   Cardiovascular: Negative for chest pain.  Gastrointestinal: Positive for abdominal pain. Negative for abdominal distention, blood in stool, constipation, diarrhea, nausea, rectal pain and vomiting.    Genitourinary: Negative for dysuria, flank pain and hematuria.  Musculoskeletal: Negative for arthralgias and joint swelling.  Skin: Negative for rash.  Neurological: Negative for syncope and headaches.    Objective:  BP 122/83   Pulse 73   Temp (!) 97.1 F (36.2 C) (Oral)   Ht 5\' 9"  (1.753 m)   Wt 258 lb (117 kg)   BMI 38.10 kg/m   BP Readings from Last 3 Encounters:  09/28/17 122/83  06/09/17 107/66  04/21/17 111/66    Wt Readings from Last 3 Encounters:  09/28/17 258 lb (117 kg)  06/09/17 262 lb (118.8 kg)  04/21/17 258 lb (117 kg)     Physical Exam  Constitutional: He appears well-developed and well-nourished.  HENT:  Head: Normocephalic and atraumatic.  Right Ear: Tympanic membrane and external ear normal. No decreased hearing is noted.  Left Ear: Tympanic membrane and external ear normal. No decreased hearing is noted.  Mouth/Throat: No oropharyngeal exudate or posterior oropharyngeal erythema.  Eyes: Pupils are equal, round, and reactive to light.  Neck: Normal range of motion. Neck supple.  Cardiovascular: Normal rate and regular rhythm.  No murmur heard. Pulmonary/Chest: Breath sounds normal. No respiratory distress.  Abdominal: Soft. Bowel sounds are normal. He exhibits no mass. There is no hepatosplenomegaly. There is tenderness in the periumbilical area and suprapubic area. There is no rigidity, no rebound, no guarding, no CVA tenderness, no tenderness at McBurney's point and negative Murphy's sign.  Vitals reviewed.     Assessment & Plan:   Roy Lawrence was seen today for abdominal pain.  Diagnoses and all orders for this visit:  Lower abdominal pain -     Urine Culture -  Urinalysis  Acute prostatitis  Other orders -     ciprofloxacin (CIPRO) 500 MG tablet; Take 1 tablet (500 mg total) by mouth 2 (two) times daily for 14 days. -     sildenafil (VIAGRA) 100 MG tablet; Take 1 tablet (100 mg total) by mouth daily as needed for erectile  dysfunction.       I have discontinued Roy Lawrence. Roy Lawrence's escitalopram. I am also having him start on ciprofloxacin and sildenafil. Additionally, I am having him maintain his Magnesium Oxide (MAG-OX PO), Vitamin D3, acetaminophen, b complex vitamins, diphenhydrAMINE, meloxicam, meclizine, NEXIUM 24HR, Testosterone, BYSTOLIC, albuterol, budesonide-formoterol, VASCEPA, and ALPRAZolam.  Allergies as of 09/28/2017      Reactions   Diovan [valsartan] Swelling   Quinapril Hcl Swelling      Medication List        Accurate as of 09/28/17 11:59 PM. Always use your most recent med list.          acetaminophen 325 MG tablet Commonly known as:  TYLENOL Take 650 mg by mouth every 6 (six) hours as needed for pain.   albuterol 108 (90 Base) MCG/ACT inhaler Commonly known as:  PROVENTIL HFA;VENTOLIN HFA Inhale 1 puff into the lungs every 6 (six) hours as needed for wheezing.   ALPRAZolam 0.5 MG tablet Commonly known as:  XANAX TAKE 1 TABLET BY MOUTH TWICE DAILY   b complex vitamins tablet Take 1 tablet by mouth daily.   budesonide-formoterol 160-4.5 MCG/ACT inhaler Commonly known as:  SYMBICORT INHALE TWO PUFFS BY MOUTH TWICE DAILY, RINSE MOUTH AFTER USING   BYSTOLIC 10 MG tablet Generic drug:  nebivolol TAKE ONE TABLET BY MOUTH TWICE DAILY   ciprofloxacin 500 MG tablet Commonly known as:  CIPRO Take 1 tablet (500 mg total) by mouth 2 (two) times daily for 14 days.   diphenhydrAMINE 25 mg capsule Commonly known as:  BENADRYL Take 25 mg by mouth every 6 (six) hours as needed.   MAG-OX PO Take 1 tablet by mouth daily.   meclizine 12.5 MG tablet Commonly known as:  ANTIVERT Take 1 tablet (12.5 mg total) by mouth 3 (three) times daily as needed for dizziness.   meloxicam 15 MG tablet Commonly known as:  MOBIC Take 1 tablet (15 mg total) by mouth daily.   NEXIUM 24HR 20 MG capsule Generic drug:  esomeprazole TAKE ONE CAPSULE BY MOUTH TWICE DAILY   sildenafil 100 MG  tablet Commonly known as:  VIAGRA Take 1 tablet (100 mg total) by mouth daily as needed for erectile dysfunction.   Testosterone 30 MG/ACT Soln APPLY TWO APPLICATIONS OF SOLUTION TOPICALLY ONCE DAILY TO EACH ARM   VASCEPA 1 g Caps Generic drug:  Icosapent Ethyl TAKE TWO CAPSULES BY MOUTH TWICE DAILY   Vitamin D3 5000 units Caps Take 5,000 Units by mouth daily.        Follow-up: Return if symptoms worsen or fail to improve.  Claretta Fraise, M.D.

## 2017-10-06 ENCOUNTER — Other Ambulatory Visit: Payer: Self-pay | Admitting: Family Medicine

## 2017-10-22 ENCOUNTER — Ambulatory Visit: Payer: PRIVATE HEALTH INSURANCE | Admitting: Family Medicine

## 2017-10-22 ENCOUNTER — Other Ambulatory Visit: Payer: PRIVATE HEALTH INSURANCE

## 2017-10-22 ENCOUNTER — Ambulatory Visit (INDEPENDENT_AMBULATORY_CARE_PROVIDER_SITE_OTHER): Payer: PRIVATE HEALTH INSURANCE

## 2017-10-22 ENCOUNTER — Encounter: Payer: Self-pay | Admitting: Family Medicine

## 2017-10-22 VITALS — BP 109/67 | HR 70 | Temp 97.5°F | Ht 69.0 in | Wt 253.0 lb

## 2017-10-22 DIAGNOSIS — Z0000 Encounter for general adult medical examination without abnormal findings: Secondary | ICD-10-CM

## 2017-10-22 DIAGNOSIS — E7800 Pure hypercholesterolemia, unspecified: Secondary | ICD-10-CM

## 2017-10-22 DIAGNOSIS — I10 Essential (primary) hypertension: Secondary | ICD-10-CM

## 2017-10-22 DIAGNOSIS — E559 Vitamin D deficiency, unspecified: Secondary | ICD-10-CM

## 2017-10-22 DIAGNOSIS — E349 Endocrine disorder, unspecified: Secondary | ICD-10-CM

## 2017-10-22 DIAGNOSIS — K219 Gastro-esophageal reflux disease without esophagitis: Secondary | ICD-10-CM

## 2017-10-22 DIAGNOSIS — R1030 Lower abdominal pain, unspecified: Secondary | ICD-10-CM

## 2017-10-22 DIAGNOSIS — J301 Allergic rhinitis due to pollen: Secondary | ICD-10-CM | POA: Insufficient documentation

## 2017-10-22 LAB — URINALYSIS, COMPLETE
Bilirubin, UA: NEGATIVE
Glucose, UA: NEGATIVE
Leukocytes, UA: NEGATIVE
Nitrite, UA: NEGATIVE
RBC, UA: NEGATIVE
Specific Gravity, UA: 1.025 (ref 1.005–1.030)
Urobilinogen, Ur: 0.2 mg/dL (ref 0.2–1.0)
pH, UA: 5.5 (ref 5.0–7.5)

## 2017-10-22 LAB — MICROSCOPIC EXAMINATION
Bacteria, UA: NONE SEEN
Epithelial Cells (non renal): NONE SEEN /hpf (ref 0–10)
RBC, UA: NONE SEEN /hpf (ref 0–2)
Renal Epithel, UA: NONE SEEN /hpf
WBC, UA: NONE SEEN /hpf (ref 0–5)

## 2017-10-22 MED ORDER — ALPRAZOLAM 0.5 MG PO TABS: 0.5000 mg | ORAL_TABLET | Freq: Two times a day (BID) | ORAL | 5 refills | Status: DC

## 2017-10-22 MED ORDER — ICOSAPENT ETHYL 1 G PO CAPS: 2.0000 | ORAL_CAPSULE | Freq: Two times a day (BID) | ORAL | 3 refills | Status: DC

## 2017-10-22 MED ORDER — FLUTICASONE PROPIONATE 50 MCG/ACT NA SUSP: 2.0000 | Freq: Every day | NASAL | 6 refills | Status: DC

## 2017-10-22 MED ORDER — TESTOSTERONE 30 MG/ACT TD SOLN: TRANSDERMAL | 5 refills | Status: DC

## 2017-10-22 NOTE — Addendum Note (Signed)
Addended by: Zannie Cove on: 10/22/2017 10:26 AM   Modules accepted: Orders

## 2017-10-22 NOTE — Progress Notes (Signed)
Subjective:    Patient ID: Roy Lawrence, male    DOB: May 17, 1970, 47 y.o.   MRN: [id number]  HPI Patient is here today for annual wellness exam and follow up of chronic medical problems which includes hypertension and hyperlipidemia. He is taking medication regularly.  The patient is doing well overall with no specific complaints today.  He is requesting refills on several of his medicines including Xanax , vascepa and his testosterone.  He will be given an FOBT to return today.  He is also due to get a chest x-ray and lab work.  He has lost 5 pounds since the last visit.  His body mass index is 38.10.  He does have hyperlipidemia obstructive sleep apnea and hypertension.  Based on these comorbidities he would be a morbid obesity patient because of his BMI being greater than 35.  This patient has testosterone deficiency chronic back pain and thrombocytopenia along with his hypertension.  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  He seems relaxed today he denies any specific problems including chest pain or shortness of breath with only occasional wheezes.  He is taking Nexium twice daily and supplements that with Tums.  We did talk about the long-term side effects of Nexium and he may consider in the future trying a ranitidine at bedtime instead of doing the Nexium twice daily.  Told him he should do this gradually.  He denies any nausea vomiting diarrhea blood in the stool or black tarry bowel movements.  He did have a colonoscopy in 2015 and says his insurance will pay for 150 he will talk to his gastroenterologist about this.  He is passing his water okay but does have some frequency and may be a little bit of slowness because of his prostate.  During the visit today we did discuss all the preventative things the patient needs to be aware of.  These include colonoscopies annual prostate exams the  importance of losing weight watching his sugar intake etc.     Patient Active Problem List   Diagnosis Date Noted  . OSA (obstructive sleep apnea) 04/20/2014  . Thrombocytopenia ([place]) 03/07/2014  . BPH (benign prostatic hyperplasia) 03/02/2014  . Hyperlipidemia 03/02/2014  . Cervical disc disorder with radiculopathy of cervical region 06/19/2013  . Vitamin D deficiency 06/19/2013  . History of diverticulitis of colon 12/08/2012  . Anxiety 11/09/2012  . History of asthma 11/09/2012  . Low back pain syndrome 11/09/2012  . Spondylolisthesis of lumbar region 11/09/2012  . Testosterone deficiency 05/09/2012  . Syncope 07/29/2011  . HTN (hypertension) 07/29/2011  . Overweight(278.02) 07/29/2011  . ESOPHAGEAL STRICTURE 01/10/2008  . DYSPHAGIA 01/10/2008  . GERD 01/09/2008  . PEPTIC STRICTURE 01/09/2008   Outpatient Encounter Medications as of 10/22/2017  Medication Sig  . acetaminophen (TYLENOL) 325 MG tablet Take 650 mg by mouth every 6 (six) hours as needed for pain.  Marland Kitchen albuterol (VENTOLIN HFA) 108 (90 Base) MCG/ACT inhaler Inhale 1 puff into the lungs every 6 (six) hours as needed for wheezing.  Marland Kitchen ALPRAZolam (XANAX) 0.5 MG tablet TAKE 1 TABLET BY MOUTH TWICE DAILY  . b complex vitamins tablet Take 1 tablet by mouth daily.  . budesonide-formoterol (SYMBICORT) 160-4.5 MCG/ACT inhaler INHALE TWO PUFFS BY MOUTH TWICE DAILY, RINSE MOUTH AFTER USING  . BYSTOLIC 10 MG tablet TAKE ONE TABLET BY MOUTH TWICE DAILY  . Cholecalciferol (VITAMIN D3) 5000 UNITS  CAPS Take 5,000 Units by mouth daily.   . diphenhydrAMINE (BENADRYL) 25 mg capsule Take 25 mg by mouth every 6 (six) hours as needed.  . Magnesium Oxide (MAG-OX PO) Take 1 tablet by mouth daily.   . meclizine (ANTIVERT) 12.5 MG tablet Take 1 tablet (12.5 mg total) by mouth 3 (three) times daily as needed for dizziness.  . meloxicam (MOBIC) 15 MG tablet Take 1 tablet (15 mg total) by mouth daily.  Marland Kitchen NEXIUM 24HR 20 MG capsule TAKE ONE  CAPSULE BY MOUTH TWICE DAILY  . sildenafil (VIAGRA) 100 MG tablet Take 1 tablet (100 mg total) by mouth daily as needed for erectile dysfunction.  . Testosterone (AXIRON) 30 MG/ACT SOLN APPLY TWO APPLICATIONS OF SOLUTION TOPICALLY ONCE DAILY TO EACH ARM  . VASCEPA 1 g CAPS TAKE TWO CAPSULES BY MOUTH TWICE DAILY   No facility-administered encounter medications on file as of 10/22/2017.      Review of Systems  Constitutional: Negative.   HENT: Negative.   Eyes: Negative.   Respiratory: Negative.   Cardiovascular: Negative.   Gastrointestinal: Negative.   Endocrine: Negative.   Genitourinary: Negative.   Musculoskeletal: Negative.   Skin: Negative.   Allergic/Immunologic: Negative.   Neurological: Negative.   Hematological: Negative.   Psychiatric/Behavioral: Negative.        Objective:   Physical Exam  Constitutional: He is oriented to person, place, and time. He appears well-developed and well-nourished. No distress.  The patient is pleasant calm and relaxed and in fact seems to be calmer and more at peace with himself this visit than previously.  I know that he is gone through a divorce and he had a stressful job which she is no longer working at.  HENT:  Head: Normocephalic and atraumatic.  Right Ear: External ear normal.  Left Ear: External ear normal.  Mouth/Throat: Oropharynx is clear and moist. No oropharyngeal exudate.  Nasal turbinate congestion bilaterally  Eyes: Pupils are equal, round, and reactive to light. Conjunctivae and EOM are normal. Right eye exhibits no discharge. Left eye exhibits no discharge. No scleral icterus.  Neck: Normal range of motion. Neck supple. No thyromegaly present.  No bruits thyromegaly or anterior cervical adenopathy  Cardiovascular: Normal rate, regular rhythm, normal heart sounds and intact distal pulses.  No murmur heard. Heart has a regular rate and rhythm at 72/min with good pedal pulses  Pulmonary/Chest: Effort normal and breath  sounds normal. No respiratory distress. He has no wheezes. He has no rales. He exhibits no tenderness.  Clear anteriorly and posteriorly with no wheezes or rales and actually clear with coughing.  No axillary adenopathy or chest wall masses  Abdominal: Soft. Bowel sounds are normal. He exhibits no mass. There is tenderness. There is no guarding.  Abdominal obesity without liver or spleen enlargement.  There is some slight tenderness in the left upper and left lower quadrants.  No bruits.  Patient understands if he continues to have problems with left lower quadrant tenderness or pain that he should get back in touch with the gastroenterologist.  Genitourinary: Rectum normal and penis normal.  Genitourinary Comments: The prostate is slightly enlarged but smooth.  There were no rectal masses.  External genitalia were within normal limits with no testicular masses or inguinal hernias being found.  Musculoskeletal: Normal range of motion. He exhibits no edema.  Lymphadenopathy:    He has no cervical adenopathy.  Neurological: He is alert and oriented to person, place, and time. He has normal reflexes. No cranial  nerve deficit.  Reflexes are 1+ and equal bilaterally  Skin: Skin is warm and dry. No erythema.  Psychiatric: He has a normal mood and affect. His behavior is normal. Judgment and thought content normal.  The patient's mood affect and behavior are improved and patient is calmer since some of the stress is more behind him now in his life.  Nursing note and vitals reviewed.   BP 109/67 (BP Location: Left Arm)   Pulse 70   Temp (!) 97.5 F (36.4 C) (Oral)   Ht 5' 9"  (1.753 m)   Wt 253 lb (114.8 kg)   BMI 37.36 kg/m   EKG with results pending    Assessment & Plan:  1. Annual physical exam -If your insurance will pay, patient will consider getting a colonoscopy when he turns 48. - EKG 12-Lead - BMP8+EGFR - CBC with Differential/Platelet - Lipid panel - VITAMIN D 25 Hydroxy (Vit-D  Deficiency, Fractures) - Hepatic function panel - PSA, total and free - Testosterone,Free and Total - Urinalysis, Complete - DG Chest 2 View; Future  2. Pure hypercholesterolemia -Continue with current treatment pending results of lab work - EKG 12-Lead - CBC with Differential/Platelet - Lipid panel - DG Chest 2 View; Future  3. Essential hypertension -Blood pressure is good today he will continue with current treatment - EKG 12-Lead - BMP8+EGFR - CBC with Differential/Platelet - Hepatic function panel - DG Chest 2 View; Future  4. Vitamin D deficiency -Continue with vitamin D replacement pending results of lab work - CBC with Differential/Platelet - VITAMIN D 25 Hydroxy (Vit-D Deficiency, Fractures)  5. Testosterone deficiency -Continue with Axiron pending results of CBC testosterone levels and PSA - CBC with Differential/Platelet - PSA, total and free - Testosterone,Free and Total - Urinalysis, Complete  6. Gastroesophageal reflux disease, esophagitis presence not specified -Continue with Nexium but try to wean off the bedtime Nexium and switch to now brand name Zantac until generic is back on the market. - CBC with Differential/Platelet - Hepatic function panel  7. Lower abdominal pain -If left lower quadrant pain continues patient will get in touch with gastroenterologist  8. Seasonal allergic rhinitis due to pollen -Resume Symbicort and continue with rescue inhaler and consider adding Flonase for nasal congestion.  Also stay on Mucinex to keep congestion loose  Meds ordered this encounter  Medications  . Testosterone (AXIRON) 30 MG/ACT SOLN    Sig: APPLY TWO APPLICATIONS OF SOLUTION TOPICALLY ONCE DAILY TO EACH ARM    Dispense:  180 mL    Refill:  5  . ALPRAZolam (XANAX) 0.5 MG tablet    Sig: Take 1 tablet (0.5 mg total) by mouth 2 (two) times daily.    Dispense:  60 tablet    Refill:  5    This prescription was filled on 09/06/2017. Any refills authorized  will be placed on file.  Vanessa Kick Ethyl (VASCEPA) 1 g CAPS    Sig: Take 2 capsules (2 g total) by mouth 2 (two) times daily.    Dispense:  120 capsule    Refill:  3    This prescription was filled on 08/15/2017. Any refills authorized will be placed on file.   Patient Instructions  Continue current medications. Continue good therapeutic lifestyle changes which include good diet and exercise. Fall precautions discussed with patient. If an FOBT was given today- please return it to our front desk. If you are over 72 years old - you may need Prevnar 75 or the adult Pneumonia vaccine.  **  Flu shots are available--- please call and schedule a FLU-CLINIC appointment**  After your visit with [place] today you will receive a survey in the mail or online from [employer] regarding your care with [place]. Please take a moment to fill this out. Your feedback is very important to [place] as you can help [place] better understand your patient needs as well as improve your experience and satisfaction. WE CARE ABOUT YOU!!!   Please continue to work on weight with diet and exercise Please continue to get PSA exams testosterone levels and rectal exams every 6 months as long as you are on the testosterone Drink plenty of fluids and stay well-hydrated We will call with lab work results as soon as these results become available Do not forget to get your flu shot sometime in October Start using your inhalers more regularly specifically the Symbicort and consider getting on Flonase 1 spray each nostril at bedtime and continue with nasal saline Consider at some point in the future trying to wean off of the 2 doses a day of Nexium by taking a Zantac 150 mg at bedtime.  If you have continued reflux and heartburn he should go back on the Nexium. Stop using the overhead fan    Arrie Senate MD

## 2017-10-22 NOTE — Patient Instructions (Addendum)
Continue current medications. Continue good therapeutic lifestyle changes which include good diet and exercise. Fall precautions discussed with patient. If an FOBT was given today- please return it to our front desk. If you are over 49 years old - you may need Prevnar 29 or the adult Pneumonia vaccine.  **Flu shots are available--- please call and schedule a FLU-CLINIC appointment**  After your visit with [place] today you will receive a survey in the mail or online from [employer] regarding your care with [place]. Please take a moment to fill this out. Your feedback is very important to [place] as you can help [place] better understand your patient needs as well as improve your experience and satisfaction. WE CARE ABOUT YOU!!!   Please continue to work on weight with diet and exercise Please continue to get PSA exams testosterone levels and rectal exams every 6 months as long as you are on the testosterone Drink plenty of fluids and stay well-hydrated We will call with lab work results as soon as these results become available Do not forget to get your flu shot sometime in October Start using your inhalers more regularly specifically the Symbicort and consider getting on Flonase 1 spray each nostril at bedtime and continue with nasal saline Consider at some point in the future trying to wean off of the 2 doses a day of Nexium by taking a Zantac 150 mg at bedtime.  If you have continued reflux and heartburn he should go back on the Nexium. Stop using the overhead fan

## 2017-10-23 LAB — PSA, TOTAL AND FREE
PSA, Free Pct: 36.4 %
PSA, Free: 0.4 ng/mL
Prostate Specific Ag, Serum: 1.1 ng/mL (ref 0.0–4.0)

## 2017-10-23 LAB — CBC WITH DIFFERENTIAL/PLATELET
Basophils Absolute: 0 10*3/uL (ref 0.0–0.2)
Basos: 1 %
EOS (ABSOLUTE): 0.1 10*3/uL (ref 0.0–0.4)
Eos: 1 %
Hematocrit: 46.9 % (ref 37.5–51.0)
Hemoglobin: 15.7 g/dL (ref 13.0–17.7)
Immature Grans (Abs): 0 10*3/uL (ref 0.0–0.1)
Immature Granulocytes: 0 %
Lymphocytes Absolute: 1.5 10*3/uL (ref 0.7–3.1)
Lymphs: 30 %
MCH: 28.5 pg (ref 26.6–33.0)
MCHC: 33.5 g/dL (ref 31.5–35.7)
MCV: 85 fL (ref 79–97)
Monocytes Absolute: 0.4 10*3/uL (ref 0.1–0.9)
Monocytes: 8 %
Neutrophils Absolute: 2.9 10*3/uL (ref 1.4–7.0)
Neutrophils: 60 %
Platelets: 136 10*3/uL — ABNORMAL LOW (ref 150–450)
RBC: 5.5 x10E6/uL (ref 4.14–5.80)
RDW: 12.9 % (ref 12.3–15.4)
WBC: 4.9 10*3/uL (ref 3.4–10.8)

## 2017-10-23 LAB — HEPATIC FUNCTION PANEL
ALT: 32 IU/L (ref 0–44)
AST: 21 IU/L (ref 0–40)
Albumin: 4.8 g/dL (ref 3.5–5.5)
Alkaline Phosphatase: 58 IU/L (ref 39–117)
Bilirubin Total: 0.4 mg/dL (ref 0.0–1.2)
Bilirubin, Direct: 0.13 mg/dL (ref 0.00–0.40)
Total Protein: 6.7 g/dL (ref 6.0–8.5)

## 2017-10-23 LAB — BMP8+EGFR
BUN/Creatinine Ratio: 16 (ref 9–20)
BUN: 20 mg/dL (ref 6–24)
CO2: 26 mmol/L (ref 20–29)
Calcium: 9.7 mg/dL (ref 8.7–10.2)
Chloride: 101 mmol/L (ref 96–106)
Creatinine, Ser: 1.27 mg/dL (ref 0.76–1.27)
GFR calc Af Amer: 76 mL/min/{1.73_m2} (ref >59)
GFR calc non Af Amer: 66 mL/min/{1.73_m2} (ref >59)
Glucose: 99 mg/dL (ref 65–99)
Potassium: 4.5 mmol/L (ref 3.5–5.2)
Sodium: 142 mmol/L (ref 134–144)

## 2017-10-23 LAB — LIPID PANEL
Chol/HDL Ratio: 4.5 ratio (ref 0.0–5.0)
Cholesterol, Total: 165 mg/dL (ref 100–199)
HDL: 37 mg/dL — ABNORMAL LOW (ref >39)
LDL Calculated: 102 mg/dL — ABNORMAL HIGH (ref 0–99)
Triglycerides: 131 mg/dL (ref 0–149)
VLDL Cholesterol Cal: 26 mg/dL (ref 5–40)

## 2017-10-23 LAB — TESTOSTERONE,FREE AND TOTAL
Testosterone, Free: 11 pg/mL (ref 6.8–21.5)
Testosterone: 411 ng/dL (ref 264–916)

## 2017-10-23 LAB — VITAMIN D 25 HYDROXY (VIT D DEFICIENCY, FRACTURES): Vit D, 25-Hydroxy: 67.7 ng/mL (ref 30.0–100.0)

## 2017-12-25 ENCOUNTER — Other Ambulatory Visit: Payer: Self-pay | Admitting: Family Medicine

## 2018-01-18 ENCOUNTER — Other Ambulatory Visit: Payer: Self-pay

## 2018-01-18 ENCOUNTER — Encounter: Payer: Self-pay | Admitting: Family Medicine

## 2018-01-18 ENCOUNTER — Ambulatory Visit: Payer: PRIVATE HEALTH INSURANCE | Admitting: Family Medicine

## 2018-01-18 ENCOUNTER — Ambulatory Visit (HOSPITAL_COMMUNITY)
Admission: RE | Admit: 2018-01-18 | Discharge: 2018-01-18 | Disposition: A | Discharge status: Home / Self Care | Payer: PRIVATE HEALTH INSURANCE | Source: Ambulatory Visit | Attending: Family Medicine | Admitting: Family Medicine

## 2018-01-18 ENCOUNTER — Emergency Department (HOSPITAL_COMMUNITY)
Admission: EM | Admit: 2018-01-18 | Discharge: 2018-01-18 | Disposition: A | Discharge status: Home / Self Care | Payer: PRIVATE HEALTH INSURANCE | Attending: Emergency Medicine | Admitting: Emergency Medicine

## 2018-01-18 ENCOUNTER — Encounter (HOSPITAL_COMMUNITY): Payer: Self-pay | Admitting: Emergency Medicine

## 2018-01-18 VITALS — BP 121/80 | HR 102 | Temp 97.7°F | Ht 69.0 in | Wt 254.0 lb

## 2018-01-18 DIAGNOSIS — F329 Major depressive disorder, single episode, unspecified: Secondary | ICD-10-CM | POA: Insufficient documentation

## 2018-01-18 DIAGNOSIS — F419 Anxiety disorder, unspecified: Secondary | ICD-10-CM | POA: Insufficient documentation

## 2018-01-18 DIAGNOSIS — N200 Calculus of kidney: Secondary | ICD-10-CM | POA: Diagnosis not present

## 2018-01-18 DIAGNOSIS — R1012 Left upper quadrant pain: Secondary | ICD-10-CM

## 2018-01-18 DIAGNOSIS — I10 Essential (primary) hypertension: Secondary | ICD-10-CM | POA: Insufficient documentation

## 2018-01-18 DIAGNOSIS — R1032 Left lower quadrant pain: Secondary | ICD-10-CM | POA: Diagnosis present

## 2018-01-18 DIAGNOSIS — J45909 Unspecified asthma, uncomplicated: Secondary | ICD-10-CM | POA: Diagnosis not present

## 2018-01-18 DIAGNOSIS — Z79899 Other long term (current) drug therapy: Secondary | ICD-10-CM | POA: Diagnosis not present

## 2018-01-18 DIAGNOSIS — R109 Unspecified abdominal pain: Secondary | ICD-10-CM

## 2018-01-18 LAB — URINALYSIS, COMPLETE
Bilirubin, UA: NEGATIVE
Glucose, UA: NEGATIVE
Ketones, UA: NEGATIVE
Leukocytes, UA: NEGATIVE
Nitrite, UA: NEGATIVE
Specific Gravity, UA: 1.02 (ref 1.005–1.030)
Urobilinogen, Ur: 0.2 mg/dL (ref 0.2–1.0)
pH, UA: 6 (ref 5.0–7.5)

## 2018-01-18 LAB — CBC WITH DIFFERENTIAL/PLATELET
Abs Immature Granulocytes: 0.02 10*3/uL (ref 0.00–0.07)
Basophils Absolute: 0 10*3/uL (ref 0.0–0.1)
Basophils Relative: 1 %
Eosinophils Absolute: 0.1 10*3/uL (ref 0.0–0.5)
Eosinophils Relative: 1 %
HCT: 46.5 % (ref 39.0–52.0)
Hemoglobin: 15.4 g/dL (ref 13.0–17.0)
Immature Granulocytes: 0 %
Lymphocytes Relative: 22 %
Lymphs Abs: 1.9 10*3/uL (ref 0.7–4.0)
MCH: 28.5 pg (ref 26.0–34.0)
MCHC: 33.1 g/dL (ref 30.0–36.0)
MCV: 86.1 fL (ref 80.0–100.0)
Monocytes Absolute: 0.6 10*3/uL (ref 0.1–1.0)
Monocytes Relative: 7 %
Neutro Abs: 5.9 10*3/uL (ref 1.7–7.7)
Neutrophils Relative %: 69 %
Platelets: 160 10*3/uL (ref 150–400)
RBC: 5.4 MIL/uL (ref 4.22–5.81)
RDW: 12.6 % (ref 11.5–15.5)
WBC: 8.5 10*3/uL (ref 4.0–10.5)
nRBC: 0 % (ref 0.0–0.2)

## 2018-01-18 LAB — MICROSCOPIC EXAMINATION
Bacteria, UA: NONE SEEN
Epithelial Cells (non renal): NONE SEEN /hpf (ref 0–10)
RBC, UA: >30 /hpf — AB (ref 0–2)
Renal Epithel, UA: NONE SEEN /hpf
WBC, UA: NONE SEEN /hpf (ref 0–5)

## 2018-01-18 LAB — COMPREHENSIVE METABOLIC PANEL
ALT: 45 U/L — ABNORMAL HIGH (ref 0–44)
AST: 26 U/L (ref 15–41)
Albumin: 4.4 g/dL (ref 3.5–5.0)
Alkaline Phosphatase: 50 U/L (ref 38–126)
Anion gap: 7 (ref 5–15)
BUN: 17 mg/dL (ref 6–20)
CO2: 28 mmol/L (ref 22–32)
Calcium: 9.3 mg/dL (ref 8.9–10.3)
Chloride: 105 mmol/L (ref 98–111)
Creatinine, Ser: 1.16 mg/dL (ref 0.61–1.24)
GFR calc Af Amer: >60 mL/min (ref >60)
GFR calc non Af Amer: >60 mL/min (ref >60)
Glucose, Bld: 109 mg/dL — ABNORMAL HIGH (ref 70–99)
Potassium: 3.9 mmol/L (ref 3.5–5.1)
Sodium: 140 mmol/L (ref 135–145)
Total Bilirubin: 0.8 mg/dL (ref 0.3–1.2)
Total Protein: 7.1 g/dL (ref 6.5–8.1)

## 2018-01-18 MED ORDER — ONDANSETRON HCL 4 MG/2ML IJ SOLN
4.0000 mg | Freq: Once | INTRAMUSCULAR | Status: DC
  Filled 2018-01-18: qty 2

## 2018-01-18 MED ORDER — KETOROLAC TROMETHAMINE 30 MG/ML IJ SOLN
30.0000 mg | Freq: Once | INTRAMUSCULAR | Status: AC
  Administered 2018-01-18: 30 mg via INTRAVENOUS
  Filled 2018-01-18: qty 1

## 2018-01-18 MED ORDER — ONDANSETRON 4 MG PO TBDP: ORAL_TABLET | ORAL | 0 refills | Status: DC

## 2018-01-18 MED ORDER — HYDROCODONE-ACETAMINOPHEN 5-325 MG PO TABS: 1.0000 | ORAL_TABLET | Freq: Four times a day (QID) | ORAL | 0 refills | Status: DC | PRN

## 2018-01-18 MED ORDER — TAMSULOSIN HCL 0.4 MG PO CAPS: 0.4000 mg | ORAL_CAPSULE | Freq: Every day | ORAL | 3 refills | Status: DC

## 2018-01-18 NOTE — Patient Instructions (Signed)
Your urine had quite a bit of blood in it.  I have a high suspicion for a kidney stone.  I have prescribed you pain medicine as well as medicine to help pass the stone.  We have ordered a stat CT scan to look at the size of the stone.  Some stones are too large to pass and require urgent evaluation by a kidney specialist to have them removed.   Kidney Stones Kidney stones (urolithiasis) are rock-like masses that form inside of the kidneys. Kidneys are organs that make pee (urine). A kidney stone can cause very bad pain and can block the flow of pee. The stone usually leaves your body (passes) through your pee. You may need to have a doctor take out the stone. Follow these instructions at home: Eating and drinking  Drink enough fluid to keep your pee clear or pale yellow. This will help you pass the stone.  If told by your doctor, change the foods you eat (your diet). This may include: ? Limiting how much salt (sodium) you eat. ? Eating more fruits and vegetables. ? Limiting how much meat, poultry, fish, and eggs you eat.  Follow instructions from your doctor about eating or drinking restrictions. General instructions  Collect pee samples as told by your doctor. You may need to collect a pee sample: ? 24 hours after a stone comes out. ? 8-12 weeks after a stone comes out, and every 6-12 months after that.  Strain your pee every time you pee (urinate), for as long as told. Use the strainer that your doctor recommends.  Do not throw out the stone. Keep it so that it can be tested by your doctor.  Take over-the-counter and prescription medicines only as told by your doctor.  Keep all follow-up visits as told by your doctor. This is important. You may need follow-up tests. Preventing kidney stones To prevent another kidney stone:  Drink enough fluid to keep your pee clear or pale yellow. This is the best way to prevent kidney stones.  Eat healthy foods.  Avoid certain foods as told by  your doctor. You may be told to eat less protein.  Stay at a healthy weight.  Contact a doctor if:  You have pain that gets worse or does not get better with medicine. Get help right away if:  You have a fever or chills.  You get very bad pain.  You get new pain in your belly (abdomen).  You pass out (faint).  You cannot pee. This information is not intended to replace advice given to you by your health care provider. Make sure you discuss any questions you have with your health care provider. Document Released: 07/08/2007 Document Revised: 10/08/2015 Document Reviewed: 10/08/2015 Elsevier Interactive Patient Education  2017 [employer].

## 2018-01-18 NOTE — Discharge Instructions (Addendum)
Follow-up with urology this week.  Call them tomorrow and get an appointment set up for this week.  If you have severe pain vomiting or fever get rechecked

## 2018-01-18 NOTE — Progress Notes (Signed)
Subjective: CC: back pain/ abdominal pain PCP: Chipper Herb, MD UMP:NTIRW Roy Lawrence is a 47 y.o. male presenting to clinic today for:  1. Back pain Patient reports onset of severe left-sided flank pain that radiates to his left abdomen this morning.  He reports one episode of vomiting as a result of pain.  He reports urgency but denies any dysuria.  He has not visualized gross hematuria but does state that his urine has been very dark.  He has been hydrating without difficulty.  No fevers.  He has been having normal bowel movements with last either last evening or this morning.  No hematochezia or melena.  No hematemesis.  No history of renal stones.   ROS: Per HPI  Allergies  Allergen Reactions  . Diovan [Valsartan] Swelling  . Quinapril Hcl Swelling   Past Medical History:  Diagnosis Date  . Allergy    SEASONAL  . Anxiety   . Asthma   . Depression   . Diverticulitis   . Diverticulosis   . Esophageal stricture   . GERD (gastroesophageal reflux disease)   . Hypertension    Prehypertension  . Obesity   . Spondylosis     Current Outpatient Medications:  .  acetaminophen (TYLENOL) 325 MG tablet, Take 650 mg by mouth every 6 (six) hours as needed for pain., Disp: , Rfl:  .  albuterol (VENTOLIN HFA) 108 (90 Base) MCG/ACT inhaler, Inhale 1 puff into the lungs every 6 (six) hours as needed for wheezing., Disp: 18 each, Rfl: 3 .  ALPRAZolam (XANAX) 0.5 MG tablet, Take 1 tablet (0.5 mg total) by mouth 2 (two) times daily., Disp: 60 tablet, Rfl: 5 .  b complex vitamins tablet, Take 1 tablet by mouth daily., Disp: , Rfl:  .  budesonide-formoterol (SYMBICORT) 160-4.5 MCG/ACT inhaler, INHALE TWO PUFFS BY MOUTH TWICE DAILY, RINSE MOUTH AFTER USING, Disp: 2 Inhaler, Rfl: 11 .  BYSTOLIC 10 MG tablet, TAKE ONE TABLET BY MOUTH TWICE DAILY, Disp: 60 tablet, Rfl: 0 .  Cholecalciferol (VITAMIN D3) 5000 UNITS CAPS, Take 5,000 Units by mouth daily. , Disp: , Rfl:  .  diphenhydrAMINE  (BENADRYL) 25 mg capsule, Take 25 mg by mouth every 6 (six) hours as needed., Disp: , Rfl:  .  fluticasone (FLONASE) 50 MCG/ACT nasal spray, Place 2 sprays into both nostrils daily., Disp: 16 g, Rfl: 6 .  Icosapent Ethyl (VASCEPA) 1 g CAPS, Take 2 capsules (2 g total) by mouth 2 (two) times daily., Disp: 120 capsule, Rfl: 3 .  Magnesium Oxide (MAG-OX PO), Take 1 tablet by mouth daily. , Disp: , Rfl:  .  meclizine (ANTIVERT) 12.5 MG tablet, Take 1 tablet (12.5 mg total) by mouth 3 (three) times daily as needed for dizziness., Disp: 30 tablet, Rfl: 0 .  NEXIUM 24HR 20 MG capsule, TAKE ONE CAPSULE BY MOUTH TWICE DAILY, Disp: 90 capsule, Rfl: 1 .  sildenafil (VIAGRA) 100 MG tablet, Take 1 tablet (100 mg total) by mouth daily as needed for erectile dysfunction., Disp: 10 tablet, Rfl: 5 .  Testosterone (AXIRON) 30 MG/ACT SOLN, APPLY TWO APPLICATIONS OF SOLUTION TOPICALLY ONCE DAILY TO EACH ARM, Disp: 180 mL, Rfl: 5 Social History   Socioeconomic History  . Marital status: Married    Spouse name: Misty  . Number of children: 0  . Years of education: 2 yrs  . Highest education level: Not on file  Occupational History  . Occupation: Chemical engineer: [employer]  Needs  . Financial resource strain: Not on file  . Food insecurity:    Worry: Not on file    Inability: Not on file  . Transportation needs:    Medical: Not on file    Non-medical: Not on file  Tobacco Use  . Smoking status: Never Smoker  . Smokeless tobacco: Never Used  Substance and Sexual Activity  . Alcohol use: Yes    Alcohol/week: 0.0 standard drinks    Comment: rarely less than 1 drink a week  . Drug use: No  . Sexual activity: Not on file  Lifestyle  . Physical activity:    Days per week: Not on file    Minutes per session: Not on file  . Stress: Not on file  Relationships  . Social connections:    Talks on phone: Not on file    Gets together: Not on file    Attends religious service: Not on file     Active member of club or organization: Not on file    Attends meetings of clubs or organizations: Not on file    Relationship status: Not on file  . Intimate partner violence:    Fear of current or ex partner: Not on file    Emotionally abused: Not on file    Physically abused: Not on file    Forced sexual activity: Not on file  Other Topics Concern  . Not on file  Social History Narrative   Patient lives at home with his spouse.  He has two children they have at home they are helping to raise.     Caffeine Use: 2-3 daily   Family History  Problem Relation Age of Onset  . Hyperlipidemia Mother   . Hypertension Mother   . GER disease Mother   . Diverticulitis Mother        partial colectomy due to this  . Asthma Mother   . Hodgkin's lymphoma Father   . Diabetes Father   . Thyroid disease Father   . Graves' disease Father   . Thyroid disease Sister   . Dementia Maternal Grandmother   . Hypertension Maternal Grandmother   . Dementia Maternal Grandfather   . Hypertension Maternal Grandfather   . Colon cancer Neg Hx     Objective: Office vital signs reviewed. BP 121/80   Pulse (!) 102   Temp 97.7 F (36.5 C) (Oral)   Ht 5\' 9"  (1.753 m)   Wt 254 lb (115.2 kg)   BMI 37.51 kg/m   Physical Examination:  General: Awake, alert, well nourished, uncomfortable appearing MSK: +TTP to left flank/low back. GI: He has epigastric, left upper quadrant and left lower quadrant tenderness to palpation.  No peritoneal signs.  Assessment/ Plan: 47 y.o. male   1. Flank pain Urinalysis obtained and he had quite a bit of red blood cells on microscopy.  I am very concerned that he has a renal stone.  Stat CT ordered to further evaluate.  I have ordered Norco and Flomax for treatment of presumed renal stone.  We discussed that his stone is too large to pass this will result in recommendations for stat urology referral versus advice to go to the emergency department.  He voiced good  understanding. - Urinalysis, Complete  2. Left upper quadrant pain - CT RENAL STONE STUDY; Future   Orders Placed This Encounter  Procedures  . CT RENAL STONE STUDY    Standing Status:   Future    Standing Expiration Date:  04/19/2019    Order Specific Question:   ** REASON FOR EXAM (FREE TEXT)    Answer:   left sided flank pain, blood in urine.    Order Specific Question:   Preferred imaging location?    Answer:   [facility]    Order Specific Question:   Radiology Contrast Protocol - do NOT remove file path    Answer:   [email or web address]  . Urinalysis, Complete   Meds ordered this encounter  Medications  . HYDROcodone-acetaminophen (NORCO) 5-325 MG tablet    Sig: Take 1 tablet by mouth every 6 (six) hours as needed for severe pain.    Dispense:  20 tablet    Refill:  0  . tamsulosin (FLOMAX) 0.4 MG CAPS capsule    Sig: Take 1 capsule (0.4 mg total) by mouth daily.    Dispense:  30 capsule    Refill:  3   The Narcotic Database has been reviewed.  He is on concomitant Xanax 0.5 mg with last fill 01/03/2018.  There were no red flags.      Janora Norlander, DO [place] [phone]

## 2018-01-18 NOTE — ED Provider Notes (Signed)
w.g [facility] EMERGENCY DEPARTMENT Provider Note   CSN: [id number] Arrival date & time: 01/18/18  1939     History   Chief Complaint Chief Complaint  Patient presents with  . Nephrolithiasis    HPI Roy Lawrence is a 47 y.o. male.  Patient complains of left-sided flank pain.  He was seen in the office today and a CT scan shows a large left ureteral stone.  The history is provided by the patient. No language interpreter was used.  Flank Pain  This is a new problem. The current episode started more than 2 days ago. The problem occurs constantly. The problem has not changed since onset.Pertinent negatives include no chest pain, no abdominal pain and no headaches. Nothing aggravates the symptoms. Nothing relieves the symptoms. He has tried nothing for the symptoms. The treatment provided no relief.    Past Medical History:  Diagnosis Date  . Allergy    SEASONAL  . Anxiety   . Asthma   . Depression   . Diverticulitis   . Diverticulosis   . Esophageal stricture   . GERD (gastroesophageal reflux disease)   . Hypertension    Prehypertension  . Obesity   . Spondylosis     Patient Active Problem List   Diagnosis Date Noted  . Seasonal allergic rhinitis due to pollen 10/22/2017  . OSA (obstructive sleep apnea) 04/20/2014  . Thrombocytopenia ([place]) 03/07/2014  . BPH (benign prostatic hyperplasia) 03/02/2014  . Hyperlipidemia 03/02/2014  . Cervical disc disorder with radiculopathy of cervical region 06/19/2013  . Vitamin D deficiency 06/19/2013  . History of diverticulitis of colon 12/08/2012  . Anxiety 11/09/2012  . History of asthma 11/09/2012  . Low back pain syndrome 11/09/2012  . Spondylolisthesis of lumbar region 11/09/2012  . Testosterone deficiency 05/09/2012  . Syncope 07/29/2011  . HTN (hypertension) 07/29/2011  . Overweight(278.02) 07/29/2011  . ESOPHAGEAL STRICTURE 01/10/2008  . DYSPHAGIA 01/10/2008  . GERD 01/09/2008  . PEPTIC STRICTURE 01/09/2008     Past Surgical History:  Procedure Laterality Date  . ESOPHAGEAL DILATION    . FINGER SURGERY Right    3rd digit; imbedded glass  . SHOULDER SURGERY Right    x 2        Home Medications    Prior to Admission medications   Medication Sig Start Date End Date Taking? Authorizing Provider  acetaminophen (TYLENOL) 325 MG tablet Take 650 mg by mouth every 6 (six) hours as needed for pain.    [provider]  albuterol (VENTOLIN HFA) 108 (90 Base) MCG/ACT inhaler Inhale 1 puff into the lungs every 6 (six) hours as needed for wheezing. 06/09/17   Chipper Herb, MD  ALPRAZolam Duanne Moron) 0.5 MG tablet Take 1 tablet (0.5 mg total) by mouth 2 (two) times daily. 10/22/17   Chipper Herb, MD  b complex vitamins tablet Take 1 tablet by mouth daily.    [provider]  budesonide-formoterol (SYMBICORT) 160-4.5 MCG/ACT inhaler INHALE TWO PUFFS BY MOUTH TWICE DAILY, RINSE MOUTH AFTER USING 06/09/17   Chipper Herb, MD  BYSTOLIC 10 MG tablet TAKE ONE TABLET BY MOUTH TWICE DAILY 04/12/17   Chipper Herb, MD  Cholecalciferol (VITAMIN D3) 5000 UNITS CAPS Take 5,000 Units by mouth daily.     [provider]  diphenhydrAMINE (BENADRYL) 25 mg capsule Take 25 mg by mouth every 6 (six) hours as needed.    [provider]  fluticasone (FLONASE) 50 MCG/ACT nasal spray Place 2 sprays into both nostrils  daily. 10/22/17   Chipper Herb, MD  HYDROcodone-acetaminophen (NORCO) 5-325 MG tablet Take 1 tablet by mouth every 6 (six) hours as needed for severe pain. 01/18/18   Janora Norlander, DO  Icosapent Ethyl (VASCEPA) 1 g CAPS Take 2 capsules (2 g total) by mouth 2 (two) times daily. 10/22/17   Chipper Herb, MD  Magnesium Oxide (MAG-OX PO) Take 1 tablet by mouth daily.     [provider]  meclizine (ANTIVERT) 12.5 MG tablet Take 1 tablet (12.5 mg total) by mouth 3 (three) times daily as needed for dizziness. 09/04/16   Eustaquio Maize, MD  NEXIUM 24HR 20 MG capsule  TAKE ONE CAPSULE BY MOUTH TWICE DAILY 12/27/17   Chipper Herb, MD  ondansetron (ZOFRAN ODT) 4 MG disintegrating tablet 34m ODT q4 hours prn nausea/vomit 01/18/18   ZMilton Ferguson MD  sildenafil (VIAGRA) 100 MG tablet Take 1 tablet (100 mg total) by mouth daily as needed for erectile dysfunction. 09/28/17   SClaretta Fraise MD  tamsulosin (FLOMAX) 0.4 MG CAPS capsule Take 1 capsule (0.4 mg total) by mouth daily. 01/18/18   GJanora Norlander DO  Testosterone (AXIRON) 30 MG/ACT SOLN APPLY TWO APPLICATIONS OF SOLUTION TOPICALLY ONCE DAILY TO EACH ARM 10/22/17   MChipper Herb MD    Family History Family History  Problem Relation Age of Onset  . Hyperlipidemia Mother   . Hypertension Mother   . GER disease Mother   . Diverticulitis Mother        partial colectomy due to this  . Asthma Mother   . Hodgkin's lymphoma Father   . Diabetes Father   . Thyroid disease Father   . Graves' disease Father   . Thyroid disease Sister   . Dementia Maternal Grandmother   . Hypertension Maternal Grandmother   . Dementia Maternal Grandfather   . Hypertension Maternal Grandfather   . Colon cancer Neg Hx     Social History Social History   Tobacco Use  . Smoking status: Never Smoker  . Smokeless tobacco: Never Used  Substance Use Topics  . Alcohol use: Yes    Alcohol/week: 0.0 standard drinks    Comment: rarely less than 1 drink a week  . Drug use: No     Allergies   Diovan [valsartan] and Quinapril hcl   Review of Systems Review of Systems  Constitutional: Negative for appetite change and fatigue.  HENT: Negative for congestion, ear discharge and sinus pressure.   Eyes: Negative for discharge.  Respiratory: Negative for cough.   Cardiovascular: Negative for chest pain.  Gastrointestinal: Negative for abdominal pain and diarrhea.  Genitourinary: Positive for flank pain. Negative for frequency and hematuria.  Musculoskeletal: Negative for back pain.  Skin: Negative for rash.    Neurological: Negative for seizures and headaches.  Psychiatric/Behavioral: Negative for hallucinations.     Physical Exam Updated Vital Signs BP 132/70 (BP Location: Right Arm)   Pulse 79   Temp 97.9 F (36.6 C) (Temporal)   Resp 14   Ht _0  (1.753 m)   Wt 115.2 kg   SpO2 99%   BMI 37.51 kg/m   Physical Exam Constitutional:      Appearance: He is well-developed.  HENT:     Head: Normocephalic.     Nose: Nose normal.     Mouth/Throat:     Mouth: Mucous membranes are moist.  Eyes:     General: No scleral icterus.    Conjunctiva/sclera: Conjunctivae normal.  Neck:  Musculoskeletal: Neck supple.     Thyroid: No thyromegaly.  Cardiovascular:     Rate and Rhythm: Normal rate and regular rhythm.     Heart sounds: No murmur. No friction rub. No gallop.   Pulmonary:     Breath sounds: No stridor. No wheezing or rales.  Chest:     Chest wall: No tenderness.  Abdominal:     General: There is no distension.     Tenderness: There is no abdominal tenderness. There is no rebound.  Musculoskeletal: Normal range of motion.  Lymphadenopathy:     Cervical: No cervical adenopathy.  Skin:    Findings: No erythema or rash.  Neurological:     Mental Status: He is oriented to person, place, and time.     Motor: No abnormal muscle tone.     Coordination: Coordination normal.  Psychiatric:        Behavior: Behavior normal.      ED Treatments / Results  Labs (all labs ordered are listed, but only abnormal results are displayed) Labs Reviewed  COMPREHENSIVE METABOLIC PANEL - Abnormal; Notable for the following components:      Result Value   Glucose, Bld 109 (*)    ALT 45 (*)    All other components within normal limits  CBC WITH DIFFERENTIAL/PLATELET    EKG None  Radiology Ct Renal Stone Study  Result Date: 01/18/2018 CLINICAL DATA:  47 year old male with left-sided flank pain extending to left abdomen starting this morning. Urgency. Dark urine. Initial  encounter. EXAM: CT ABDOMEN AND PELVIS WITHOUT CONTRAST TECHNIQUE: Multidetector CT imaging of the abdomen and pelvis was performed following the standard protocol without IV contrast. COMPARISON:  12/04/2012 CT. FINDINGS: Lower chest: Minimal scarring lung bases. Heart size within normal limits. Prominent epicardial fat pad. Hepatobiliary: Fatty liver. Taking into account limitation by non contrast imaging, no worrisome hepatic lesion. No calcified gallstone or common bile duct stone. No CT evidence of gallbladder inflammation. Pancreas: Taking into account limitation by non contrast imaging, no worrisome pancreatic mass or inflammation. Spleen: Taking into account limitation by non contrast imaging, no splenic mass or enlargement. Adrenals/Urinary Tract: 2.5 mm proximal left ureteral obstructing stone located 16.5 cm proximal to left ureteral vesical junction is causing mild to moderate proximal left-sided hydroureter nephrosis. Right upper pole 3 mm nonobstructing stone. Inferior right renal 1 cm cyst. Posterior right renal subcentimeter structure too small to adequately characterize, possibly a cyst. No adrenal lesion. Noncontrast filled slightly under distended imaging of the urinary bladder without worrisome abnormality. Prominent urachal remnant incidentally noted. Stomach/Bowel: Diverticulosis most notable sigmoid colon with associated muscular hypertrophy. No extraluminal bowel inflammatory process. No inflammation surrounds the appendix. Stomach under distended without gross abnormality. No small bowel abnormality detected. Vascular/Lymphatic: No abdominal aortic aneurysm. Scattered normal size lymph nodes. Reproductive: Top-normal size prostate gland. Other: No free intraperitoneal air or bowel containing hernia. Musculoskeletal: Bilateral L5 pars defects. Minimal anterior slip L5 with minimal bulge. IMPRESSION: 1. 2.5 mm proximal left ureteral obstructing stone located 16.5 cm proximal to left ureteral  vesical junction is causing mild to moderate proximal left-sided hydroureter nephrosis. 2. Right upper pole 3 mm nonobstructing stone. Inferior right renal 1 cm cyst. Posterior right renal subcentimeter structure too small to adequately characterize, possibly a cyst. 3. Diverticulosis most notable sigmoid colon with associated muscular hypertrophy. No extraluminal bowel inflammatory process. 4. Fatty liver. 5. Bilateral L5 pars defects. These results will be called to the ordering clinician or representative by the Radiologist Assistant, and communication  documented in the PACS or zVision Dashboard. Electronically Signed   By: Genia Del M.D.   On: 01/18/2018 18:05    Procedures Procedures (including critical care time)  Medications Ordered in ED Medications  ondansetron (ZOFRAN) injection 4 mg (has no administration in time range)  ketorolac (TORADOL) 30 MG/ML injection 30 mg (30 mg Intravenous Given 01/18/18 2107)     Initial Impression / Assessment and Plan / ED Course  I have reviewed the triage vital signs and the nursing notes.  Pertinent labs & imaging results that were available during my care of the patient were reviewed by me and considered in my medical decision making (see chart for details).   Urine shows some blood otherwise negative.  CBC and c-Met unremarkable.  Patient not having any more pain here in the department.  Patient will follow-up with urology this week  Final Clinical Impressions(s) / ED Diagnoses   Final diagnoses:  Kidney stone    ED Discharge Orders         Ordered    ondansetron (ZOFRAN ODT) 4 MG disintegrating tablet     01/18/18 2208           Milton Ferguson, MD 01/18/18 2214

## 2018-01-18 NOTE — ED Triage Notes (Signed)
Pt states he was seen at PCP today for left sided back and flank pain. PCP did urinalysis and found RBCs in urine. Pt came for outpatient CT and was told he has an obstructing kidney stone and was told to come to the ER.

## 2018-01-19 ENCOUNTER — Other Ambulatory Visit: Payer: Self-pay | Admitting: Family Medicine

## 2018-01-19 DIAGNOSIS — N200 Calculus of kidney: Secondary | ICD-10-CM

## 2018-01-19 NOTE — Progress Notes (Signed)
ref

## 2018-03-07 ENCOUNTER — Other Ambulatory Visit: Payer: Self-pay | Admitting: Family Medicine

## 2018-03-13 ENCOUNTER — Other Ambulatory Visit: Payer: Self-pay | Admitting: Family Medicine

## 2018-03-14 NOTE — Telephone Encounter (Signed)
Last seen 01/18/18

## 2018-04-02 ENCOUNTER — Other Ambulatory Visit: Payer: Self-pay | Admitting: Family Medicine

## 2018-04-22 ENCOUNTER — Ambulatory Visit: Payer: PRIVATE HEALTH INSURANCE | Admitting: Family Medicine

## 2018-04-25 ENCOUNTER — Ambulatory Visit: Payer: PRIVATE HEALTH INSURANCE | Admitting: Family Medicine

## 2018-05-02 ENCOUNTER — Other Ambulatory Visit: Payer: Self-pay | Admitting: Family Medicine

## 2018-05-02 ENCOUNTER — Telehealth: Payer: Self-pay | Admitting: Family Medicine

## 2018-05-02 NOTE — Telephone Encounter (Signed)
Controlled. Needs to be refilled by PCP

## 2018-05-02 NOTE — Telephone Encounter (Signed)
Last seen 01/18/18  DWM

## 2018-05-02 NOTE — Telephone Encounter (Signed)
Telephone appointment scheduled with Dr. Livia Snellen.

## 2018-05-02 NOTE — Telephone Encounter (Signed)
What symptoms do you have? Cough, chest congestion pt states it is URI  How long have you been sick? 3 Weeks  Have you been seen for this problem? No, wants antibiotic and steroid   If your provider decides to give you a prescription, which pharmacy would you like for it to be sent to? [facility]   Patient informed that this information will be sent to the clinical staff for review and that they should receive a follow up call.

## 2018-05-03 ENCOUNTER — Other Ambulatory Visit: Payer: Self-pay

## 2018-05-03 ENCOUNTER — Encounter: Payer: Self-pay | Admitting: Family Medicine

## 2018-05-03 ENCOUNTER — Other Ambulatory Visit: Payer: Self-pay | Admitting: *Deleted

## 2018-05-03 ENCOUNTER — Ambulatory Visit (INDEPENDENT_AMBULATORY_CARE_PROVIDER_SITE_OTHER): Payer: PRIVATE HEALTH INSURANCE | Admitting: Family Medicine

## 2018-05-03 DIAGNOSIS — J40 Bronchitis, not specified as acute or chronic: Secondary | ICD-10-CM

## 2018-05-03 DIAGNOSIS — J329 Chronic sinusitis, unspecified: Secondary | ICD-10-CM

## 2018-05-03 MED ORDER — ALPRAZOLAM 0.5 MG PO TABS: 0.5000 mg | ORAL_TABLET | Freq: Two times a day (BID) | ORAL | 5 refills | Status: DC

## 2018-05-03 MED ORDER — AMOXICILLIN-POT CLAVULANATE 875-125 MG PO TABS: 1.0000 | ORAL_TABLET | Freq: Two times a day (BID) | ORAL | 0 refills | Status: DC

## 2018-05-03 MED ORDER — PREDNISONE 10 MG PO TABS: ORAL_TABLET | ORAL | 0 refills | Status: DC

## 2018-05-03 MED ORDER — FEXOFENADINE HCL 180 MG PO TABS: 180.0000 mg | ORAL_TABLET | Freq: Every day | ORAL | 11 refills | Status: DC

## 2018-05-03 NOTE — Progress Notes (Signed)
Subjective:  Patient ID: Roy Lawrence, male    DOB: 1970/05/06  Age: 48 y.o. MRN: [id number]  CC: No chief complaint on file.   HPI Roy Lawrence presents for three weeks ago had onset cough low grade fever, congestion. Cough and chest congestion persist. Fever now gone. Cough producing green-brown mucous at first History of asthma. Not responding to  Inhaler use at home.  Depression screen [facility] 2/9 01/18/2018 10/22/2017 06/09/2017  Decreased Interest 1 1 0  Down, Depressed, Hopeless 1 1 0  PHQ - 2 Score 2 2 0  Altered sleeping 2 1 -  Tired, decreased energy 2 1 -  Change in appetite 1 1 -  Feeling bad or failure about yourself  0 1 -  Trouble concentrating 0 0 -  Moving slowly or fidgety/restless 0 0 -  Suicidal thoughts 0 0 -  PHQ-9 Score 7 6 -  Difficult doing work/chores Not difficult at all Somewhat difficult -    History Roy Lawrence has a past medical history of Allergy, Anxiety, Asthma, Depression, Diverticulitis, Diverticulosis, Esophageal stricture, GERD (gastroesophageal reflux disease), Hypertension, Obesity, and Spondylosis.   He has a past surgical history that includes Shoulder surgery (Right); Finger surgery (Right); and Esophageal dilation.   His family history includes Asthma in his mother; Dementia in his maternal grandfather and maternal grandmother; Diabetes in his father; Diverticulitis in his mother; GER disease in his mother; Berenice Primas' disease in his father; Hodgkin's lymphoma in his father; Hyperlipidemia in his mother; Hypertension in his maternal grandfather, maternal grandmother, and mother; Thyroid disease in his father and sister.He reports that he has never smoked. He has never used smokeless tobacco. He reports current alcohol use. He reports that he does not use drugs.    ROS Review of Systems  Constitutional: Negative.  Negative for activity change, appetite change, chills and fever.  HENT: Positive for congestion and rhinorrhea. Negative for ear  discharge, ear pain, hearing loss, nosebleeds, postnasal drip, sinus pressure, sneezing and trouble swallowing.   Eyes: Negative for visual disturbance.  Respiratory: Positive for chest tightness and wheezing. Negative for cough and shortness of breath.   Cardiovascular: Negative for chest pain, palpitations and leg swelling.  Gastrointestinal: Negative for abdominal pain.  Skin: Negative for rash.  Neurological: Negative for headaches.    Objective:  There were no vitals taken for this visit.  BP Readings from Last 3 Encounters:  01/18/18 132/70  01/18/18 121/80  10/22/17 109/67    Wt Readings from Last 3 Encounters:  01/18/18 254 lb (115.2 kg)  01/18/18 254 lb (115.2 kg)  10/22/17 253 lb (114.8 kg)     Physical Exam  Phone visit  Assessment & Plan:   Diagnoses and all orders for this visit:  Sinobronchitis  Other orders -     amoxicillin-clavulanate (AUGMENTIN) 875-125 MG tablet; Take 1 tablet by mouth 2 (two) times daily. Take all of this medication -     predniSONE (DELTASONE) 10 MG tablet; Take 5 daily for 2 days followed by 4,3,2 and 1 for 2 days each. -     fexofenadine (ALLEGRA) 180 MG tablet; Take 1 tablet (180 mg total) by mouth daily. For allergy symptoms       I am having Roy Lawrence start on amoxicillin-clavulanate, predniSONE, and fexofenadine. I am also having him maintain his Magnesium Oxide (MAG-OX PO), Vitamin D3, acetaminophen, b complex vitamins, diphenhydrAMINE, meclizine, Bystolic, albuterol, budesonide-formoterol, Testosterone, ALPRAZolam, fluticasone, HYDROcodone-acetaminophen, tamsulosin, ondansetron, sildenafil, NexIUM 24HR, and Vascepa.  Allergies  as of 05/03/2018      Reactions   Diovan [valsartan] Swelling   Quinapril Hcl Swelling      Medication List       Accurate as of May 03, 2018  8:54 AM. Always use your most recent med list.        acetaminophen 325 MG tablet Commonly known as:  TYLENOL Take 650 mg by mouth every 6  (six) hours as needed for pain.   albuterol 108 (90 Base) MCG/ACT inhaler Commonly known as:  Ventolin HFA Inhale 1 puff into the lungs every 6 (six) hours as needed for wheezing.   ALPRAZolam 0.5 MG tablet Commonly known as:  XANAX Take 1 tablet (0.5 mg total) by mouth 2 (two) times daily.   amoxicillin-clavulanate 875-125 MG tablet Commonly known as:  AUGMENTIN Take 1 tablet by mouth 2 (two) times daily. Take all of this medication   b complex vitamins tablet Take 1 tablet by mouth daily.   budesonide-formoterol 160-4.5 MCG/ACT inhaler Commonly known as:  Symbicort INHALE TWO PUFFS BY MOUTH TWICE DAILY, RINSE MOUTH AFTER USING   Bystolic 10 MG tablet Generic drug:  nebivolol TAKE ONE TABLET BY MOUTH TWICE DAILY   diphenhydrAMINE 25 mg capsule Commonly known as:  BENADRYL Take 25 mg by mouth every 6 (six) hours as needed.   fexofenadine 180 MG tablet Commonly known as:  ALLEGRA Take 1 tablet (180 mg total) by mouth daily. For allergy symptoms   fluticasone 50 MCG/ACT nasal spray Commonly known as:  FLONASE Place 2 sprays into both nostrils daily.   HYDROcodone-acetaminophen 5-325 MG tablet Commonly known as:  Norco Take 1 tablet by mouth every 6 (six) hours as needed for severe pain.   MAG-OX PO Take 1 tablet by mouth daily.   meclizine 12.5 MG tablet Commonly known as:  ANTIVERT Take 1 tablet (12.5 mg total) by mouth 3 (three) times daily as needed for dizziness.   NexIUM 24HR 20 MG capsule Generic drug:  esomeprazole TAKE ONE CAPSULE BY MOUTH TWICE DAILY   ondansetron 4 MG disintegrating tablet Commonly known as:  ZOFRAN-ODT DISSOLVE ONE TABLET UNDER THE TONGUE EVERY 4 HOURS AS NEEDED FOR NAUSEA AND VOMITING   predniSONE 10 MG tablet Commonly known as:  DELTASONE Take 5 daily for 2 days followed by 4,3,2 and 1 for 2 days each.   sildenafil 100 MG tablet Commonly known as:  VIAGRA TAKE 1 TABLET BY MOUTH AS NEEDED FOR E.D.   tamsulosin 0.4 MG Caps  capsule Commonly known as:  FLOMAX Take 1 capsule (0.4 mg total) by mouth daily.   Testosterone 30 MG/ACT Soln Commonly known as:  Axiron APPLY TWO APPLICATIONS OF SOLUTION TOPICALLY ONCE DAILY TO EACH ARM   Vascepa 1 g Caps Generic drug:  Icosapent Ethyl TAKE TWO CAPSULES BY MOUTH TWICE DAILY   Vitamin D3 125 MCG (5000 UT) Caps Take 5,000 Units by mouth daily.      Virtual Visit via telephone Note  I discussed the limitations, risks, security and privacy concerns of performing an evaluation and management service by telephone and the availability of in person appointments. I also discussed with the patient that there may be a patient responsible charge related to this service. The patient expressed understanding and agreed to proceed.  Follow Up Instructions:   I discussed the assessment and treatment plan with the patient. The patient was provided an opportunity to ask questions and all were answered. The patient agreed with the plan and demonstrated an understanding  of the instructions.   The patient was advised to call back or seek an in-person evaluation if the symptoms worsen or if the condition fails to improve as anticipated.  Call started: 8:32 Call ended:  8:49 Call minutes: 17   Follow-up: Return if symptoms worsen or fail to improve.  Claretta Fraise, M.D.

## 2018-05-03 NOTE — Telephone Encounter (Signed)
Please refill this prescription for this patient if the timing and quantity is appropriate and on time

## 2018-05-16 ENCOUNTER — Other Ambulatory Visit: Payer: Self-pay | Admitting: Family Medicine

## 2018-06-07 ENCOUNTER — Other Ambulatory Visit: Payer: Self-pay

## 2018-06-07 ENCOUNTER — Ambulatory Visit (INDEPENDENT_AMBULATORY_CARE_PROVIDER_SITE_OTHER): Payer: PRIVATE HEALTH INSURANCE | Admitting: Family Medicine

## 2018-06-07 ENCOUNTER — Encounter: Payer: Self-pay | Admitting: Family Medicine

## 2018-06-07 DIAGNOSIS — J301 Allergic rhinitis due to pollen: Secondary | ICD-10-CM

## 2018-06-07 DIAGNOSIS — E559 Vitamin D deficiency, unspecified: Secondary | ICD-10-CM | POA: Diagnosis not present

## 2018-06-07 DIAGNOSIS — I10 Essential (primary) hypertension: Secondary | ICD-10-CM

## 2018-06-07 DIAGNOSIS — E349 Endocrine disorder, unspecified: Secondary | ICD-10-CM

## 2018-06-07 DIAGNOSIS — G4733 Obstructive sleep apnea (adult) (pediatric): Secondary | ICD-10-CM

## 2018-06-07 DIAGNOSIS — D696 Thrombocytopenia, unspecified: Secondary | ICD-10-CM

## 2018-06-07 DIAGNOSIS — N400 Enlarged prostate without lower urinary tract symptoms: Secondary | ICD-10-CM

## 2018-06-07 DIAGNOSIS — E7800 Pure hypercholesterolemia, unspecified: Secondary | ICD-10-CM

## 2018-06-07 DIAGNOSIS — E782 Mixed hyperlipidemia: Secondary | ICD-10-CM

## 2018-06-07 DIAGNOSIS — Z6836 Body mass index (BMI) 36.0-36.9, adult: Secondary | ICD-10-CM

## 2018-06-07 DIAGNOSIS — K219 Gastro-esophageal reflux disease without esophagitis: Secondary | ICD-10-CM

## 2018-06-07 NOTE — Patient Instructions (Signed)
Follow-up with urology as planned Come to the office for fasting lab work which will include a PSA and a testosterone level and patient should get rectal exam at next visit Continue to practice good hand and respiratory hygiene Follow-up with neurosurgery as needed for chronic neck and back pain Pursue aggressive therapeutic lifestyle changes to include diet and exercise

## 2018-06-07 NOTE — Addendum Note (Signed)
Addended by: Zannie Cove on: 06/07/2018 11:56 AM   Modules accepted: Orders

## 2018-06-07 NOTE — Progress Notes (Signed)
Virtual Visit Via telephone Note I connected with@ on 06/07/18 by telephone and verified that I am speaking with the correct person or authorized healthcare agent using two identifiers. Roy Lawrence is currently located at home and there are no unauthorized people in close proximity. I completed this visit while in a private location in my home .  I connected to the patient by telephone and confirmed that I was speaking with the correct person.  This visit type was conducted due to national recommendations for restrictions regarding the COVID-19 Pandemic (e.g. social distancing).  This format is felt to be most appropriate for this patient at this time.  All issues noted in this document were discussed and addressed.  No physical exam was performed.    I discussed the limitations, risks, security and privacy concerns of performing an evaluation and management service by telephone and the availability of in person appointments. I also discussed with the patient that there may be a patient responsible charge related to this service. The patient expressed understanding and agreed to proceed.   Date:  06/07/2018    ID:  [name]      September 24, 1970        [id number]   Patient Care Team Patient Care Team: Chipper Herb, MD as PCP - General (Family Medicine) Glenna Fellows, MD (Neurosurgery) Irene Shipper, MD (Gastroenterology)  Reason for Visit: Primary Care Follow-up     History of Present Illness & Review of Systems:     Roy Lawrence is a 48 y.o. year old male primary care patient that presents today for a telehealth visit.  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  It is much less stressful.  He had a recent respiratory infection with a fever and was treated with Augmentin and was concerned that he could have had coronavirus.  No testing was able to be done at that time he is doing much better.  Today he denies  any problems with chest pain pressure tightness or shortness of breath.  His asthma issues are stable and once he had this infection everything has cleared.  He only uses his albuterol as needed now.  His GI symptoms are stable and his reflux is stable.  He denies any trouble with heartburn indigestion nausea vomiting diarrhea blood in the stool or black tarry bowel movements.  He is passing his water well and still using his Axiron for erectile dysfunction.  [facility] primarily because of a recent kidney stone.  He still has his ongoing back pain but this is stable.  He has been referred to Dr. Christella Noa as Dr. Carloyn Manner has retired.  Review of systems as stated otherwise negative for body systems left unmentioned.   The patient does not have symptoms concerning for COVID-19 infection (fever, chills, cough, or new shortness of breath).      Current Medications (Verified) Allergies as of 06/07/2018      Reactions   Diovan [valsartan] Swelling   Quinapril Hcl Swelling      Medication List       Accurate as of Jun 07, 2018  8:11 AM. Always use your most recent med list.        acetaminophen 325 MG tablet Commonly known as:  TYLENOL Take 650 mg by mouth every 6 (six) hours as needed for pain.   albuterol 108 (90 Base) MCG/ACT  inhaler Commonly known as:  Ventolin HFA Inhale 1 puff into the lungs every 6 (six) hours as needed for wheezing.   ALPRAZolam 0.5 MG tablet Commonly known as:  XANAX Take 1 tablet (0.5 mg total) by mouth 2 (two) times daily.   amoxicillin-clavulanate 875-125 MG tablet Commonly known as:  AUGMENTIN Take 1 tablet by mouth 2 (two) times daily. Take all of this medication   b complex vitamins tablet Take 1 tablet by mouth daily.   budesonide-formoterol 160-4.5 MCG/ACT inhaler Commonly known as:  Symbicort INHALE TWO PUFFS BY MOUTH TWICE DAILY, RINSE MOUTH AFTER USING   Bystolic 10 MG tablet Generic drug:  nebivolol TAKE ONE TABLET BY MOUTH TWICE DAILY    diphenhydrAMINE 25 mg capsule Commonly known as:  BENADRYL Take 25 mg by mouth every 6 (six) hours as needed.   esomeprazole 20 MG capsule Commonly known as:  NEXIUM TAKE ONE CAPSULE BY MOUTH TWICE DAILY   fexofenadine 180 MG tablet Commonly known as:  ALLEGRA Take 1 tablet (180 mg total) by mouth daily. For allergy symptoms   fluticasone 50 MCG/ACT nasal spray Commonly known as:  FLONASE Place 2 sprays into both nostrils daily.   HYDROcodone-acetaminophen 5-325 MG tablet Commonly known as:  Norco Take 1 tablet by mouth every 6 (six) hours as needed for severe pain.   MAG-OX PO Take 1 tablet by mouth daily.   meclizine 12.5 MG tablet Commonly known as:  ANTIVERT Take 1 tablet (12.5 mg total) by mouth 3 (three) times daily as needed for dizziness.   ondansetron 4 MG disintegrating tablet Commonly known as:  ZOFRAN-ODT DISSOLVE ONE TABLET UNDER THE TONGUE EVERY 4 HOURS AS NEEDED FOR NAUSEA AND VOMITING   predniSONE 10 MG tablet Commonly known as:  DELTASONE Take 5 daily for 2 days followed by 4,3,2 and 1 for 2 days each.   sildenafil 100 MG tablet Commonly known as:  VIAGRA TAKE 1 TABLET BY MOUTH AS NEEDED FOR E.D.   tamsulosin 0.4 MG Caps capsule Commonly known as:  FLOMAX Take 1 capsule (0.4 mg total) by mouth daily.   Testosterone 30 MG/ACT Soln Commonly known as:  Axiron APPLY TWO APPLICATIONS OF SOLUTION TOPICALLY ONCE DAILY TO EACH ARM   Vascepa 1 g Caps Generic drug:  Icosapent Ethyl TAKE TWO CAPSULES BY MOUTH TWICE DAILY   Vitamin D3 125 MCG (5000 UT) Caps Take 5,000 Units by mouth daily.           Allergies (Verified)    Diovan [valsartan] and Quinapril hcl  Past Medical History Past Medical History:  Diagnosis Date  . Allergy    SEASONAL  . Anxiety   . Asthma   . Depression   . Diverticulitis   . Diverticulosis   . Esophageal stricture   . GERD (gastroesophageal reflux disease)   . Hypertension    Prehypertension  . Obesity   .  Spondylosis      Past Surgical History:  Procedure Laterality Date  . ESOPHAGEAL DILATION    . FINGER SURGERY Right    3rd digit; imbedded glass  . SHOULDER SURGERY Right    x 2    Social History   Socioeconomic History  . Marital status: Married    Spouse name: Misty  . Number of children: 0  . Years of education: 2 yrs  . Highest education level: Not on file  Occupational History  . Occupation: Chemical engineer: [employer]  . Financial  resource strain: Not on file  . Food insecurity:    Worry: Not on file    Inability: Not on file  . Transportation needs:    Medical: Not on file    Non-medical: Not on file  Tobacco Use  . Smoking status: Never Smoker  . Smokeless tobacco: Never Used  Substance and Sexual Activity  . Alcohol use: Yes    Alcohol/week: 0.0 standard drinks    Comment: rarely less than 1 drink a week  . Drug use: No  . Sexual activity: Not on file  Lifestyle  . Physical activity:    Days per week: Not on file    Minutes per session: Not on file  . Stress: Not on file  Relationships  . Social connections:    Talks on phone: Not on file    Gets together: Not on file    Attends religious service: Not on file    Active member of club or organization: Not on file    Attends meetings of clubs or organizations: Not on file    Relationship status: Not on file  Other Topics Concern  . Not on file  Social History Narrative   Patient lives at home with his spouse.  He has two children they have at home they are helping to raise.     Caffeine Use: 2-3 daily     Family History  Problem Relation Age of Onset  . Hyperlipidemia Mother   . Hypertension Mother   . GER disease Mother   . Diverticulitis Mother        partial colectomy due to this  . Asthma Mother   . Hodgkin's lymphoma Father   . Diabetes Father   . Thyroid disease Father   . Graves' disease Father   . Thyroid disease Sister   . Dementia Maternal Grandmother    . Hypertension Maternal Grandmother   . Dementia Maternal Grandfather   . Hypertension Maternal Grandfather   . Colon cancer Neg Hx       Labs/Other Tests and Data Reviewed:    Wt Readings from Last 3 Encounters:  01/18/18 254 lb (115.2 kg)  01/18/18 254 lb (115.2 kg)  10/22/17 253 lb (114.8 kg)   Temp Readings from Last 3 Encounters:  01/18/18 97.9 F (36.6 C) (Temporal)  01/18/18 97.7 F (36.5 C) (Oral)  10/22/17 (!) 97.5 F (36.4 C) (Oral)   BP Readings from Last 3 Encounters:  01/18/18 132/70  01/18/18 121/80  10/22/17 109/67   Pulse Readings from Last 3 Encounters:  01/18/18 79  01/18/18 (!) 102  10/22/17 70     Lab Results  Component Value Date   HGBA1C 5.1 05/04/2012   Lab Results  Component Value Date   LDLCALC 102 (H) 10/22/2017   CREATININE 1.16 01/18/2018       Chemistry      Component Value Date/Time   NA 140 01/18/2018 2107   NA 142 10/22/2017 1000   K 3.9 01/18/2018 2107   CL 105 01/18/2018 2107   CO2 28 01/18/2018 2107   BUN 17 01/18/2018 2107   BUN 20 10/22/2017 1000   CREATININE 1.16 01/18/2018 2107   CREATININE 1.07 05/04/2012 1053      Component Value Date/Time   CALCIUM 9.3 01/18/2018 2107   ALKPHOS 50 01/18/2018 2107   AST 26 01/18/2018 2107   ALT 45 (H) 01/18/2018 2107   BILITOT 0.8 01/18/2018 2107   BILITOT 0.4 10/22/2017 1000  OBSERVATIONS/ OBJECTIVE:     The patient is alert and feeling well and seems to be calm in nature with his new job.  His weight is down because of the recent prednisone that he has stopped taking and still runs around 255 pounds which is too much and he understands this.  His blood pressure has been running in the range of 117/78.  All questions were answered and patient seems to have a good handle on taking good care of himself.  No refills were needed.  Physical exam deferred due to nature of telephonic visit.  ASSESSMENT & PLAN    Time:   Today, I have spent 22 minutes with the  patient via telephone discussing the above including Covid precautions.     Visit Diagnoses: 1. Essential hypertension -Continue with current treatment and sodium restriction and weight loss through diet and exercise  2. Testosterone deficiency -Continue with Axiron  3.  Mixed hyperlipidemia -Continue with aggressive therapeutic lifestyle changes, Vascepa,  4. Vitamin D deficiency -Continue with vitamin D replacement pending results of lab work  5. Gastroesophageal reflux disease, esophagitis presence not specified -Continue with Nexium 20 mg twice daily but try to gradually switch over to Pepcid AC if tolerated.  6. Seasonal allergic rhinitis due to pollen -Continue with current therapy  7. Thrombocytopenia (HCC) -No history of any bleeding or increased bruising issues.  8. BMI 36.0-36.9,adult -Continue to make all efforts at weight loss through diet and exercise  9. OSA (obstructive sleep apnea) -Continue with CPAP and follow-up with neurology as needed  10. Benign prostatic hyperplasia without lower urinary tract symptoms -Rectal exam needed at next visit.  Patient is on Axiron so we will get a PSA with his upcoming blood work and a testosterone level.  11. Mixed hyperlipidemia -Continue with Vascepa and aggressive therapeutic lifestyle changes and weight loss  No refills are needed  Patient Instructions  Follow-up with urology as planned Come to the office for fasting lab work which will include a PSA and a testosterone level and patient should get rectal exam at next visit Continue to practice good hand and respiratory hygiene Follow-up with neurosurgery as needed for chronic neck and back pain Pursue aggressive therapeutic lifestyle changes to include diet and exercise     The above assessment and management plan was discussed with the patient. The patient verbalized understanding of and has agreed to the management plan. Patient is aware to call the clinic if  symptoms persist or worsen. Patient is aware when to return to the clinic for a follow-up visit. Patient educated on when it is appropriate to go to the emergency department.    Chipper Herb, MD [facility] [address] [place], [place], [place] [address] Ph [phone]   Arrie Senate MD

## 2018-07-10 ENCOUNTER — Other Ambulatory Visit: Payer: Self-pay | Admitting: Family Medicine

## 2018-07-13 ENCOUNTER — Other Ambulatory Visit: Payer: Self-pay | Admitting: Family Medicine

## 2018-08-02 ENCOUNTER — Telehealth: Payer: Self-pay | Admitting: Family Medicine

## 2018-08-02 DIAGNOSIS — E7800 Pure hypercholesterolemia, unspecified: Secondary | ICD-10-CM

## 2018-08-02 DIAGNOSIS — I10 Essential (primary) hypertension: Secondary | ICD-10-CM

## 2018-08-03 NOTE — Telephone Encounter (Signed)
orderis in computer. I do not care where he goes to get lbas drawn. If he can do closer to home that is fine.

## 2018-08-03 NOTE — Telephone Encounter (Signed)
Patient aware.

## 2018-08-04 ENCOUNTER — Other Ambulatory Visit: Payer: Self-pay | Admitting: *Deleted

## 2018-08-04 ENCOUNTER — Telehealth: Payer: Self-pay | Admitting: Family Medicine

## 2018-08-04 ENCOUNTER — Other Ambulatory Visit: Payer: Self-pay | Admitting: Family Medicine

## 2018-08-04 DIAGNOSIS — N400 Enlarged prostate without lower urinary tract symptoms: Secondary | ICD-10-CM

## 2018-08-04 DIAGNOSIS — I10 Essential (primary) hypertension: Secondary | ICD-10-CM

## 2018-08-04 DIAGNOSIS — E7800 Pure hypercholesterolemia, unspecified: Secondary | ICD-10-CM

## 2018-08-04 NOTE — Telephone Encounter (Signed)
Labs ordered , pcp switched

## 2018-08-10 ENCOUNTER — Other Ambulatory Visit: Payer: Self-pay | Admitting: *Deleted

## 2018-08-10 DIAGNOSIS — E7800 Pure hypercholesterolemia, unspecified: Secondary | ICD-10-CM

## 2018-08-10 DIAGNOSIS — I10 Essential (primary) hypertension: Secondary | ICD-10-CM

## 2018-08-10 DIAGNOSIS — E349 Endocrine disorder, unspecified: Secondary | ICD-10-CM

## 2018-08-10 DIAGNOSIS — E559 Vitamin D deficiency, unspecified: Secondary | ICD-10-CM

## 2018-08-10 DIAGNOSIS — N400 Enlarged prostate without lower urinary tract symptoms: Secondary | ICD-10-CM

## 2018-08-10 NOTE — Progress Notes (Signed)
Pt had labs drawn this AM at [facility] Orders released

## 2018-08-11 LAB — LIPID PANEL
Chol/HDL Ratio: 4.7 ratio (ref 0.0–5.0)
Cholesterol, Total: 168 mg/dL (ref 100–199)
HDL: 36 mg/dL — ABNORMAL LOW (ref >39)
LDL Calculated: 90 mg/dL (ref 0–99)
Triglycerides: 212 mg/dL — ABNORMAL HIGH (ref 0–149)
VLDL Cholesterol Cal: 42 mg/dL — ABNORMAL HIGH (ref 5–40)

## 2018-08-11 LAB — CMP14+EGFR
ALT: 63 IU/L — ABNORMAL HIGH (ref 0–44)
AST: 34 IU/L (ref 0–40)
Albumin/Globulin Ratio: 2.1 (ref 1.2–2.2)
Albumin: 4.5 g/dL (ref 4.0–5.0)
Alkaline Phosphatase: 56 IU/L (ref 39–117)
BUN/Creatinine Ratio: 16 (ref 9–20)
BUN: 21 mg/dL (ref 6–24)
Bilirubin Total: 0.4 mg/dL (ref 0.0–1.2)
CO2: 23 mmol/L (ref 20–29)
Calcium: 9.5 mg/dL (ref 8.7–10.2)
Chloride: 102 mmol/L (ref 96–106)
Creatinine, Ser: 1.32 mg/dL — ABNORMAL HIGH (ref 0.76–1.27)
GFR calc Af Amer: 72 mL/min/{1.73_m2} (ref >59)
GFR calc non Af Amer: 62 mL/min/{1.73_m2} (ref >59)
Globulin, Total: 2.1 g/dL (ref 1.5–4.5)
Glucose: 97 mg/dL (ref 65–99)
Potassium: 4.5 mmol/L (ref 3.5–5.2)
Sodium: 142 mmol/L (ref 134–144)
Total Protein: 6.6 g/dL (ref 6.0–8.5)

## 2018-08-11 LAB — VITAMIN D 25 HYDROXY (VIT D DEFICIENCY, FRACTURES): Vit D, 25-Hydroxy: 53.7 ng/mL (ref 30.0–100.0)

## 2018-08-11 LAB — PSA, TOTAL AND FREE
PSA, Free Pct: 26.4 %
PSA, Free: 0.29 ng/mL
Prostate Specific Ag, Serum: 1.1 ng/mL (ref 0.0–4.0)

## 2018-08-13 LAB — TESTOSTERONE,FREE AND TOTAL
Testosterone, Free: 36.2 pg/mL — ABNORMAL HIGH (ref 7.2–24.0)
Testosterone: >1500 ng/dL — ABNORMAL HIGH (ref 264–916)

## 2018-09-01 ENCOUNTER — Other Ambulatory Visit: Payer: Self-pay

## 2018-09-01 DIAGNOSIS — Z20822 Contact with and (suspected) exposure to covid-19: Secondary | ICD-10-CM

## 2018-09-04 LAB — NOVEL CORONAVIRUS, NAA: SARS-CoV-2, NAA: NOT DETECTED

## 2018-09-28 ENCOUNTER — Other Ambulatory Visit: Payer: Self-pay | Admitting: Family Medicine

## 2018-11-07 ENCOUNTER — Other Ambulatory Visit: Payer: Self-pay | Admitting: Family Medicine

## 2018-11-18 ENCOUNTER — Other Ambulatory Visit: Payer: Self-pay | Admitting: *Deleted

## 2018-11-18 NOTE — Telephone Encounter (Signed)
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

## 2018-11-19 ENCOUNTER — Other Ambulatory Visit: Payer: Self-pay | Admitting: Family Medicine

## 2018-11-28 ENCOUNTER — Ambulatory Visit (INDEPENDENT_AMBULATORY_CARE_PROVIDER_SITE_OTHER): Payer: PRIVATE HEALTH INSURANCE | Admitting: Family Medicine

## 2018-11-28 DIAGNOSIS — R42 Dizziness and giddiness: Secondary | ICD-10-CM | POA: Diagnosis not present

## 2018-11-28 MED ORDER — PSEUDOEPHEDRINE-GUAIFENESIN ER 120-1200 MG PO TB12: 1.0000 | ORAL_TABLET | Freq: Two times a day (BID) | ORAL | 0 refills | Status: DC

## 2018-11-28 MED ORDER — AMOXICILLIN-POT CLAVULANATE 875-125 MG PO TABS: 1.0000 | ORAL_TABLET | Freq: Two times a day (BID) | ORAL | 0 refills | Status: DC

## 2018-11-28 MED ORDER — MECLIZINE HCL 12.5 MG PO TABS: 12.5000 mg | ORAL_TABLET | Freq: Three times a day (TID) | ORAL | 0 refills | Status: DC | PRN

## 2018-11-28 NOTE — Progress Notes (Signed)
Subjective:    Patient ID: Roy Lawrence, male    DOB: [birth year], 48 y.o.   MRN: [id number]   HPI: Roy Lawrence is a 48 y.o. male presenting for intermittent dizzy spells for several days accompanied by weakness. Has some allergy issues in the fall. A little congestion. Using allegra. Headache, mild 2 days ago. When closing his eyes will lose balance. ght headed, loses balance and room spins when he turns. Feels like he is leaning.    Depression screen [facility] 2/9 09/13/2015 03/15/2015 12/07/2014 03/02/2014  Decreased Interest 0 0 0 0  Down, Depressed, Hopeless 0 0 0 0  PHQ - 2 Score 0 0 0 0  Some encounter information is confidential and restricted. Go to Review Flowsheets activity to see all data.     Relevant past medical, surgical, family and social history reviewed and updated as indicated.  Interim medical history since our last visit reviewed. Allergies and medications reviewed and updated.  ROS:  Review of Systems  Constitutional: Negative.   HENT: Negative.   Eyes: Negative for visual disturbance.  Respiratory: Negative for cough and shortness of breath.   Cardiovascular: Negative for chest pain and leg swelling.  Gastrointestinal: Negative for abdominal pain, diarrhea, nausea and vomiting.  Genitourinary: Negative for difficulty urinating.  Musculoskeletal: Negative for arthralgias and myalgias.  Skin: Negative for rash.  Neurological: Positive for dizziness and headaches.  Psychiatric/Behavioral: Negative for sleep disturbance.     Social History   Tobacco Use  Smoking Status Never Smoker  Smokeless Tobacco Never Used       Objective:     Wt Readings from Last 3 Encounters:  09/13/15 251 lb (113.9 kg)  05/08/15 262 lb (118.8 kg)  03/15/15 255 lb (115.7 kg)     Exam deferred. Pt. Harboring due to COVID 19. Phone visit performed.   Assessment & Plan:   1. Vertigo     Meds ordered this encounter  Medications  . amoxicillin-clavulanate (AUGMENTIN)  875-125 MG tablet    Sig: Take 1 tablet by mouth 2 (two) times daily. Take all of this medication    Dispense:  20 tablet    Refill:  0  . Pseudoephedrine-Guaifenesin [id number] MG TB12    Sig: Take 1 tablet by mouth 2 (two) times daily. For congestion    Dispense:  12 tablet    Refill:  0  . meclizine (ANTIVERT) 12.5 MG tablet    Sig: Take 1 tablet (12.5 mg total) by mouth 3 (three) times daily as needed for dizziness.    Dispense:  30 tablet    Refill:  0    No orders of the defined types were placed in this encounter.     Diagnoses and all orders for this visit:  Vertigo  Other orders -     amoxicillin-clavulanate (AUGMENTIN) 875-125 MG tablet; Take 1 tablet by mouth 2 (two) times daily. Take all of this medication -     Pseudoephedrine-Guaifenesin [id number] MG TB12; Take 1 tablet by mouth 2 (two) times daily. For congestion -     meclizine (ANTIVERT) 12.5 MG tablet; Take 1 tablet (12.5 mg total) by mouth 3 (three) times daily as needed for dizziness.    Virtual Visit via telephone Note  I discussed the limitations, risks, security and privacy concerns of performing an evaluation and management service by telephone and the availability of in person appointments. The patient was identified with two identifiers. Pt.expressed understanding and agreed to proceed. Pt. Is at  home. Dr. Livia Snellen is in his office.  Follow Up Instructions:   I discussed the assessment and treatment plan with the patient. The patient was provided an opportunity to ask questions and all were answered. The patient agreed with the plan and demonstrated an understanding of the instructions.   The patient was advised to call back or seek an in-person evaluation if the symptoms worsen or if the condition fails to improve as anticipated.   Total minutes including chart review and phone contact time: 21   Follow up plan: Return if symptoms worsen or fail to improve.  Claretta Fraise, MD [place]

## 2018-12-03 ENCOUNTER — Encounter: Payer: Self-pay | Admitting: Family Medicine

## 2018-12-19 ENCOUNTER — Encounter: Payer: Self-pay | Admitting: Family Medicine

## 2018-12-19 ENCOUNTER — Other Ambulatory Visit: Payer: Self-pay

## 2018-12-19 ENCOUNTER — Ambulatory Visit: Payer: PRIVATE HEALTH INSURANCE | Admitting: Family Medicine

## 2018-12-19 VITALS — BP 113/75 | HR 61 | Temp 97.3°F | Resp 20 | Ht 69.0 in | Wt 252.0 lb

## 2018-12-19 DIAGNOSIS — Z0001 Encounter for general adult medical examination with abnormal findings: Secondary | ICD-10-CM | POA: Diagnosis not present

## 2018-12-19 DIAGNOSIS — J301 Allergic rhinitis due to pollen: Secondary | ICD-10-CM

## 2018-12-19 DIAGNOSIS — G4733 Obstructive sleep apnea (adult) (pediatric): Secondary | ICD-10-CM

## 2018-12-19 DIAGNOSIS — M5010 Cervical disc disorder with radiculopathy, unspecified cervical region: Secondary | ICD-10-CM

## 2018-12-19 DIAGNOSIS — E782 Mixed hyperlipidemia: Secondary | ICD-10-CM

## 2018-12-19 DIAGNOSIS — K219 Gastro-esophageal reflux disease without esophagitis: Secondary | ICD-10-CM | POA: Diagnosis not present

## 2018-12-19 DIAGNOSIS — E349 Endocrine disorder, unspecified: Secondary | ICD-10-CM | POA: Diagnosis not present

## 2018-12-19 DIAGNOSIS — M4316 Spondylolisthesis, lumbar region: Secondary | ICD-10-CM

## 2018-12-19 DIAGNOSIS — Z7251 High risk heterosexual behavior: Secondary | ICD-10-CM

## 2018-12-19 DIAGNOSIS — E559 Vitamin D deficiency, unspecified: Secondary | ICD-10-CM

## 2018-12-19 DIAGNOSIS — D696 Thrombocytopenia, unspecified: Secondary | ICD-10-CM

## 2018-12-19 DIAGNOSIS — I10 Essential (primary) hypertension: Secondary | ICD-10-CM | POA: Diagnosis not present

## 2018-12-19 DIAGNOSIS — Z114 Encounter for screening for human immunodeficiency virus [HIV]: Secondary | ICD-10-CM

## 2018-12-19 DIAGNOSIS — Z125 Encounter for screening for malignant neoplasm of prostate: Secondary | ICD-10-CM

## 2018-12-19 MED ORDER — TRAZODONE HCL 150 MG PO TABS: ORAL_TABLET | ORAL | 5 refills | Status: DC

## 2018-12-19 NOTE — Progress Notes (Signed)
Subjective:  Patient ID: Roy Lawrence, male    DOB: Oct 20, 1970  Age: 48 y.o. MRN: [id number]  CC: Annual Exam   HPI Roy Lawrence presents for CPE. Off testosterone med for 2 months. Didn't seem to make a difference. Not sleeping well. Was using xanax for that, but wiling to try trazodone. He had some nausea and a low grade fever of 100.4 two days ago. Had COVID test done. Results are pending. Pt. Had colonoscopy at age 89. Was told next one would be at age 81.   Depression screen [facility] 2/9 09/13/2015 03/15/2015 12/07/2014  Decreased Interest 0 0 0  Down, Depressed, Hopeless 0 0 0  PHQ - 2 Score 0 0 0  Some encounter information is confidential and restricted. Go to Review Flowsheets activity to see all data.    History Roy Lawrence has a past medical history of Allergy, Anxiety, Asthma, Depression, Diverticulitis, Diverticulosis, Esophageal stricture, GERD (gastroesophageal reflux disease), Hypertension, Obesity, and Spondylosis.   He has a past surgical history that includes Shoulder surgery (Right); Finger surgery (Right); and Esophageal dilation.   His family history includes Asthma in his mother; Dementia in his maternal grandfather and maternal grandmother; Diabetes in his father; Diverticulitis in his mother; GER disease in his mother; Berenice Primas' disease in his father; Hodgkin's lymphoma in his father; Hyperlipidemia in his mother; Hypertension in his maternal grandfather, maternal grandmother, and mother; Thyroid disease in his father and sister.He reports that he has never smoked. He has never used smokeless tobacco. He reports current alcohol use. He reports that he does not use drugs.    ROS Review of Systems  Constitutional: Positive for fever. Negative for activity change, fatigue and unexpected weight change.  HENT: Negative for congestion, ear pain, hearing loss, postnasal drip and trouble swallowing.   Eyes: Negative for pain and visual disturbance.  Respiratory: Negative for  cough, chest tightness and shortness of breath.   Cardiovascular: Negative for chest pain, palpitations and leg swelling.  Gastrointestinal: Positive for nausea (limited to 2 days ago.). Negative for abdominal distention, abdominal pain, blood in stool, constipation, diarrhea and vomiting.  Endocrine: Negative for cold intolerance, heat intolerance and polydipsia.  Genitourinary: Negative for difficulty urinating, dysuria, flank pain, frequency and urgency.  Musculoskeletal: Negative for arthralgias and joint swelling.  Skin: Negative for color change, rash and wound.  Neurological: Positive for dizziness (mild intermittent vertigo persists.). Negative for syncope, speech difficulty, weakness, light-headedness, numbness and headaches.  Hematological: Does not bruise/bleed easily.  Psychiatric/Behavioral: Negative for confusion, decreased concentration, dysphoric mood and sleep disturbance. The patient is not nervous/anxious.     Objective:  Ht 5\' 9"  (1.753 m)   Wt 252 lb (114.3 kg)   BMI 37.21 kg/m   BP Readings from Last 3 Encounters:  09/19/15 131/91  09/13/15 111/67  05/08/15 130/80    Wt Readings from Last 3 Encounters:  12/19/18 252 lb (114.3 kg)  09/13/15 251 lb (113.9 kg)  05/08/15 262 lb (118.8 kg)     Physical Exam Constitutional:      Appearance: He is well-developed.  HENT:     Head: Normocephalic and atraumatic.     Right Ear: Tympanic membrane normal.     Left Ear: Tympanic membrane normal.  Eyes:     Pupils: Pupils are equal, round, and reactive to light.  Neck:     Musculoskeletal: Normal range of motion.     Thyroid: No thyromegaly.     Trachea: No tracheal deviation.  Cardiovascular:  Rate and Rhythm: Normal rate and regular rhythm.     Heart sounds: Normal heart sounds. No murmur. No friction rub. No gallop.   Pulmonary:     Breath sounds: Normal breath sounds. No wheezing or rales.  Abdominal:     General: Bowel sounds are normal. There is no  distension.     Palpations: Abdomen is soft. There is no mass.     Tenderness: There is no abdominal tenderness.     Hernia: There is no hernia in the left inguinal area.  Genitourinary:    Penis: Normal.      Scrotum/Testes: Normal.  Musculoskeletal: Normal range of motion.  Lymphadenopathy:     Cervical: No cervical adenopathy.  Skin:    General: Skin is warm and dry.  Neurological:     Mental Status: He is alert and oriented to person, place, and time.       Assessment & Plan:   There are no diagnoses linked to this encounter.     I have discontinued Leonie Green. Dukeman's HYDROcodone-acetaminophen, tamsulosin, predniSONE, Vascepa, amoxicillin-clavulanate, and Pseudoephedrine-Guaifenesin. I am also having him maintain his Magnesium Oxide (MAG-OX PO), Vitamin D3, acetaminophen, b complex vitamins, diphenhydrAMINE, albuterol, budesonide-formoterol, Testosterone, fluticasone, ondansetron, fexofenadine, ALPRAZolam, sildenafil, Bystolic, NexIUM [id number], and meclizine.  Allergies as of 12/19/2018      Reactions   Diovan [valsartan] Swelling   Quinapril Hcl Swelling      Medication List       Accurate as of December 19, 2018  2:17 PM. If you have any questions, ask your nurse or doctor.        STOP taking these medications   amoxicillin-clavulanate 875-125 MG tablet Commonly known as: AUGMENTIN Stopped by: Claretta Fraise, MD   HYDROcodone-acetaminophen 5-325 MG tablet Commonly known as: Norco Stopped by: Claretta Fraise, MD   predniSONE 10 MG tablet Commonly known as: DELTASONE Stopped by: Claretta Fraise, MD   Pseudoephedrine-Guaifenesin [id number] MG Tb12 Stopped by: Claretta Fraise, MD   tamsulosin 0.4 MG Caps capsule Commonly known as: FLOMAX Stopped by: Claretta Fraise, MD   Vascepa 1 g capsule Generic drug: icosapent Ethyl Stopped by: Claretta Fraise, MD     TAKE these medications   acetaminophen 325 MG tablet Commonly known as: TYLENOL Take 650 mg by mouth every 6  (six) hours as needed for pain.   albuterol 108 (90 Base) MCG/ACT inhaler Commonly known as: Ventolin HFA Inhale 1 puff into the lungs every 6 (six) hours as needed for wheezing.   ALPRAZolam 0.5 MG tablet Commonly known as: XANAX Take 1 tablet (0.5 mg total) by mouth 2 (two) times daily.   b complex vitamins tablet Take 1 tablet by mouth daily.   budesonide-formoterol 160-4.5 MCG/ACT inhaler Commonly known as: Symbicort INHALE TWO PUFFS BY MOUTH TWICE DAILY, RINSE MOUTH AFTER USING   Bystolic 10 MG tablet Generic drug: nebivolol TAKE 1 TABLET BY MOUTH TWICE DAILY   diphenhydrAMINE 25 mg capsule Commonly known as: BENADRYL Take 25 mg by mouth every 6 (six) hours as needed.   fexofenadine 180 MG tablet Commonly known as: ALLEGRA Take 1 tablet (180 mg total) by mouth daily. For allergy symptoms   fluticasone 50 MCG/ACT nasal spray Commonly known as: FLONASE Place 2 sprays into both nostrils daily.   MAG-OX PO Take 1 tablet by mouth daily.   meclizine 12.5 MG tablet Commonly known as: ANTIVERT Take 1 tablet (12.5 mg total) by mouth 3 (three) times daily as needed for dizziness.   NexIUM 24HR 20  MG capsule Generic drug: esomeprazole TAKE ONE CAPSULE BY MOUTH TWICE DAILY   ondansetron 4 MG disintegrating tablet Commonly known as: ZOFRAN-ODT DISSOLVE ONE TABLET UNDER THE TONGUE EVERY 4 HOURS AS NEEDED FOR NAUSEA AND VOMITING   sildenafil 100 MG tablet Commonly known as: VIAGRA TAKE 1 TABLET BY MOUTH AS NEEDED FOR ED   Testosterone 30 MG/ACT Soln Commonly known as: Axiron APPLY TWO APPLICATIONS OF SOLUTION TOPICALLY ONCE DAILY TO EACH ARM   Vitamin D3 125 MCG (5000 UT) Caps Take 5,000 Units by mouth daily.        Follow-up: No follow-ups on file.  Claretta Fraise, M.D.

## 2018-12-20 ENCOUNTER — Telehealth: Payer: Self-pay | Admitting: Family Medicine

## 2018-12-20 ENCOUNTER — Other Ambulatory Visit: Payer: Self-pay

## 2018-12-20 DIAGNOSIS — Z20822 Contact with and (suspected) exposure to covid-19: Secondary | ICD-10-CM

## 2018-12-20 NOTE — Telephone Encounter (Signed)
Aware.Provider is working Hospital doctor.  He did not want to schedule a visit.  He says he will call tomorrow to speak with dr. Livia Snellen or his nurse.

## 2018-12-21 ENCOUNTER — Telehealth: Payer: Self-pay | Admitting: Family Medicine

## 2018-12-21 LAB — NOVEL CORONAVIRUS, NAA: SARS-CoV-2, NAA: NOT DETECTED

## 2018-12-21 NOTE — Telephone Encounter (Signed)
FYI patient just wanted to let you know COVID testing negative and he done one more test yesterday.

## 2018-12-23 ENCOUNTER — Other Ambulatory Visit: Payer: Self-pay

## 2018-12-23 ENCOUNTER — Other Ambulatory Visit: Payer: PRIVATE HEALTH INSURANCE

## 2018-12-23 LAB — URINALYSIS
Bilirubin, UA: NEGATIVE
Glucose, UA: NEGATIVE
Ketones, UA: NEGATIVE
Leukocytes,UA: NEGATIVE
Nitrite, UA: NEGATIVE
Protein,UA: NEGATIVE
RBC, UA: NEGATIVE
Specific Gravity, UA: 1.025 (ref 1.005–1.030)
Urobilinogen, Ur: 0.2 mg/dL (ref 0.2–1.0)
pH, UA: 6 (ref 5.0–7.5)

## 2018-12-24 LAB — CMP14+EGFR
ALT: 70 IU/L — ABNORMAL HIGH (ref 0–44)
AST: 33 IU/L (ref 0–40)
Albumin/Globulin Ratio: 2.4 — ABNORMAL HIGH (ref 1.2–2.2)
Albumin: 4.7 g/dL (ref 4.0–5.0)
Alkaline Phosphatase: 63 IU/L (ref 39–117)
BUN/Creatinine Ratio: 19 (ref 9–20)
BUN: 23 mg/dL (ref 6–24)
Bilirubin Total: 0.5 mg/dL (ref 0.0–1.2)
CO2: 25 mmol/L (ref 20–29)
Calcium: 9.6 mg/dL (ref 8.7–10.2)
Chloride: 102 mmol/L (ref 96–106)
Creatinine, Ser: 1.22 mg/dL (ref 0.76–1.27)
GFR calc Af Amer: 79 mL/min/{1.73_m2} (ref >59)
GFR calc non Af Amer: 69 mL/min/{1.73_m2} (ref >59)
Globulin, Total: 2 g/dL (ref 1.5–4.5)
Glucose: 91 mg/dL (ref 65–99)
Potassium: 4.4 mmol/L (ref 3.5–5.2)
Sodium: 141 mmol/L (ref 134–144)
Total Protein: 6.7 g/dL (ref 6.0–8.5)

## 2018-12-24 LAB — TESTOSTERONE,FREE AND TOTAL
Testosterone, Free: 5.7 pg/mL — ABNORMAL LOW (ref 7.2–24.0)
Testosterone: 225 ng/dL — ABNORMAL LOW (ref 264–916)

## 2018-12-24 LAB — CBC WITH DIFFERENTIAL/PLATELET
Basophils Absolute: 0.1 10*3/uL (ref 0.0–0.2)
Basos: 1 %
EOS (ABSOLUTE): 0.1 10*3/uL (ref 0.0–0.4)
Eos: 2 %
Hematocrit: 47.3 % (ref 37.5–51.0)
Hemoglobin: 15.9 g/dL (ref 13.0–17.7)
Immature Grans (Abs): 0 10*3/uL (ref 0.0–0.1)
Immature Granulocytes: 0 %
Lymphocytes Absolute: 2.1 10*3/uL (ref 0.7–3.1)
Lymphs: 41 %
MCH: 29.3 pg (ref 26.6–33.0)
MCHC: 33.6 g/dL (ref 31.5–35.7)
MCV: 87 fL (ref 79–97)
Monocytes Absolute: 0.4 10*3/uL (ref 0.1–0.9)
Monocytes: 8 %
Neutrophils Absolute: 2.4 10*3/uL (ref 1.4–7.0)
Neutrophils: 48 %
Platelets: 152 10*3/uL (ref 150–450)
RBC: 5.43 x10E6/uL (ref 4.14–5.80)
RDW: 11.9 % (ref 11.6–15.4)
WBC: 5.1 10*3/uL (ref 3.4–10.8)

## 2018-12-24 LAB — HIV ANTIBODY (ROUTINE TESTING W REFLEX): HIV Screen 4th Generation wRfx: NONREACTIVE

## 2018-12-24 LAB — LIPID PANEL
Chol/HDL Ratio: 4.4 ratio (ref 0.0–5.0)
Cholesterol, Total: 164 mg/dL (ref 100–199)
HDL: 37 mg/dL — ABNORMAL LOW (ref >39)
LDL Chol Calc (NIH): 104 mg/dL — ABNORMAL HIGH (ref 0–99)
Triglycerides: 127 mg/dL (ref 0–149)
VLDL Cholesterol Cal: 23 mg/dL (ref 5–40)

## 2018-12-24 LAB — RPR: RPR Ser Ql: NONREACTIVE

## 2018-12-24 LAB — PSA TOTAL (REFLEX TO FREE): Prostate Specific Ag, Serum: 1.6 ng/mL (ref 0.0–4.0)

## 2019-01-19 ENCOUNTER — Other Ambulatory Visit: Payer: Self-pay

## 2019-01-19 ENCOUNTER — Ambulatory Visit (INDEPENDENT_AMBULATORY_CARE_PROVIDER_SITE_OTHER): Payer: PRIVATE HEALTH INSURANCE | Admitting: Family Medicine

## 2019-01-19 ENCOUNTER — Encounter: Payer: Self-pay | Admitting: Family Medicine

## 2019-01-19 DIAGNOSIS — J329 Chronic sinusitis, unspecified: Secondary | ICD-10-CM

## 2019-01-19 DIAGNOSIS — J40 Bronchitis, not specified as acute or chronic: Secondary | ICD-10-CM | POA: Diagnosis not present

## 2019-01-19 MED ORDER — BENZONATATE 200 MG PO CAPS: 200.0000 mg | ORAL_CAPSULE | Freq: Three times a day (TID) | ORAL | 0 refills | Status: DC | PRN

## 2019-01-19 MED ORDER — AZITHROMYCIN 250 MG PO TABS: ORAL_TABLET | ORAL | 0 refills | Status: DC

## 2019-01-19 NOTE — Progress Notes (Signed)
Subjective:    Patient ID: Roy Lawrence, male    DOB: 1970/03/06, 48 y.o.   MRN: [id number]   HPI: Roy Lawrence is a 48 y.o. male presenting for fever for 6 days. Low grade, 100-101. Headache off & on, congested and coughing. Nonproductive. Now inhaling irritates his lungs. Gets tickle that gets him started coughing. No energy. Sleeping a lot. Was exposed to black mold.    Depression screen [facility] 2/9 12/19/2018 09/13/2015 03/15/2015 12/07/2014 03/02/2014  Decreased Interest 0 0 0 0 0  Down, Depressed, Hopeless 0 0 0 0 0  PHQ - 2 Score 0 0 0 0 0  Some encounter information is confidential and restricted. Go to Review Flowsheets activity to see all data.     Relevant past medical, surgical, family and social history reviewed and updated as indicated.  Interim medical history since our last visit reviewed. Allergies and medications reviewed and updated.  ROS:  Review of Systems  Constitutional: Negative for activity change, appetite change, chills and fever.  HENT: Positive for congestion, rhinorrhea, sinus pressure and sinus pain. Negative for ear discharge, ear pain, hearing loss, nosebleeds, postnasal drip, sneezing and trouble swallowing.   Respiratory: Negative for chest tightness and shortness of breath.   Cardiovascular: Negative for chest pain and palpitations.  Skin: Negative for rash.  Neurological: Positive for headaches (like an icepick going through my eye.).     Social History   Tobacco Use  Smoking Status Never Smoker  Smokeless Tobacco Never Used       Objective:     Wt Readings from Last 3 Encounters:  12/19/18 252 lb (114.3 kg)  09/13/15 251 lb (113.9 kg)  05/08/15 262 lb (118.8 kg)     Exam deferred. Pt. Harboring due to COVID 19. Phone visit performed.   Assessment & Plan:   1. Sinobronchitis     Meds ordered this encounter  Medications  . azithromycin (ZITHROMAX Z-PAK) 250 MG tablet    Sig: Take two right away Then one a day for the next 4  days.    Dispense:  6 each    Refill:  0  . benzonatate (TESSALON) 200 MG capsule    Sig: Take 1 capsule (200 mg total) by mouth 3 (three) times daily as needed for cough.    Dispense:  20 capsule    Refill:  0    Orders Placed This Encounter  Procedures  . COVID    Order Specific Question:   Is this test for diagnosis or screening    Answer:   Diagnosis of ill patient    Order Specific Question:   Symptomatic for COVID-19 as defined by CDC    Answer:   Yes    Order Specific Question:   Date of Symptom Onset    Answer:   01/13/2019    Order Specific Question:   Hospitalized for COVID-19    Answer:   No    Order Specific Question:   Admitted to ICU for COVID-19    Answer:   No    Order Specific Question:   Resident in a congregate (group) care setting    Answer:   No    Order Specific Question:   Is the patient student?    Answer:   No    Order Specific Question:   Employed in healthcare setting    Answer:   Unknown    Order Specific Question:   Previously tested for COVID-19  Answer:   Yes      Diagnoses and all orders for this visit:  Sinobronchitis -     COVID  Other orders -     azithromycin (ZITHROMAX Z-PAK) 250 MG tablet; Take two right away Then one a day for the next 4 days. -     benzonatate (TESSALON) 200 MG capsule; Take 1 capsule (200 mg total) by mouth 3 (three) times daily as needed for cough.    Virtual Visit via telephone Note  I discussed the limitations, risks, security and privacy concerns of performing an evaluation and management service by telephone and the availability of in person appointments. The patient was identified with two identifiers. Pt.expressed understanding and agreed to proceed. Pt. Is at home. Dr. Livia Snellen is in his office.  Follow Up Instructions:   I discussed the assessment and treatment plan with the patient. The patient was provided an opportunity to ask questions and all were answered. The patient agreed with the plan and  demonstrated an understanding of the instructions.   The patient was advised to call back or seek an in-person evaluation if the symptoms worsen or if the condition fails to improve as anticipated.   Total minutes including chart review and phone contact time:  12 Follow up plan: Return if symptoms worsen or fail to improve.  Claretta Fraise, MD [place]

## 2019-01-20 ENCOUNTER — Ambulatory Visit: Payer: PRIVATE HEALTH INSURANCE | Attending: Internal Medicine

## 2019-01-20 ENCOUNTER — Other Ambulatory Visit: Payer: PRIVATE HEALTH INSURANCE

## 2019-01-20 ENCOUNTER — Other Ambulatory Visit: Payer: Self-pay

## 2019-01-20 DIAGNOSIS — Z20822 Contact with and (suspected) exposure to covid-19: Secondary | ICD-10-CM

## 2019-01-21 LAB — NOVEL CORONAVIRUS, NAA: SARS-CoV-2, NAA: DETECTED — AB

## 2019-01-22 ENCOUNTER — Telehealth: Payer: Self-pay | Admitting: Infectious Diseases

## 2019-01-22 NOTE — Telephone Encounter (Signed)
Called to discuss with patient about Covid symptoms and the use of bamlanivimab, a monoclonal antibody infusion for those with mild to moderate Covid symptoms and at a high risk of hospitalization.  Pt is qualified for this infusion at the [facility] infusion center due to BMI>35 (37.2) however he declines infusion at this time.   He is still having fevers intermittently now with last temperature 100-101 last PM but no fever today thusfar. He is having prolonged symptom of fever and nasal congestion and will need to continue isolation until afebrile without intervention x 24 hours and at least 10 days beyond symptom onset with improvement. He has no immunosuppressive conditions or medications that would warrant prolonged isolation period.   Sister tested + recently and girlfriend now with symptoms awaiting results. I asked him to please pass along information to his family members and call referral line should they be interested in receiving.

## 2019-01-31 ENCOUNTER — Telehealth: Payer: Self-pay | Admitting: Family Medicine

## 2019-01-31 NOTE — Telephone Encounter (Signed)
Patient aware and verbalized understanding. °

## 2019-01-31 NOTE — Telephone Encounter (Signed)
I just tried to call patient myself but got no answer. How are his symptoms? Has there been a significant improvement? Is he still running a fever? Is he requiring medications such as Tylenol/Ibuprofen that may be masking a fever?

## 2019-01-31 NOTE — Telephone Encounter (Signed)
He may return to work since it has been greater than two weeks, he has a significant improvement in respiratory symptoms and he has no fever with no medication to mask or reduce fever. I sent his note via MyChart.

## 2019-01-31 NOTE — Telephone Encounter (Signed)
No fever in five days. Cough and SOB is still lingering. Patient feels like he has improved a bunch, Inhalers help a lot with the SOB.

## 2019-02-18 ENCOUNTER — Other Ambulatory Visit: Payer: Self-pay | Admitting: Family Medicine

## 2019-04-06 ENCOUNTER — Other Ambulatory Visit: Payer: Self-pay | Admitting: Family Medicine

## 2019-04-26 ENCOUNTER — Other Ambulatory Visit: Payer: Self-pay | Admitting: *Deleted

## 2019-04-26 MED ORDER — ESOMEPRAZOLE MAGNESIUM 20 MG PO CPDR: 20.0000 mg | DELAYED_RELEASE_CAPSULE | Freq: Two times a day (BID) | ORAL | 0 refills | Status: DC

## 2019-05-05 ENCOUNTER — Ambulatory Visit: Payer: PRIVATE HEALTH INSURANCE | Attending: Internal Medicine

## 2019-05-05 DIAGNOSIS — Z23 Encounter for immunization: Secondary | ICD-10-CM

## 2019-05-05 NOTE — Progress Notes (Signed)
   Covid-19 Vaccination Clinic  Name:  Roy Lawrence    MRN: [id number] DOB: [birth year]  05/05/2019  Roy Lawrence was observed post Covid-19 immunization for 15 minutes without incident. He was provided with Vaccine Information Sheet and instruction to access the V-Safe system.   Roy Lawrence was instructed to call 911 with any severe reactions post vaccine: Marland Kitchen Difficulty breathing  . Swelling of face and throat  . A fast heartbeat  . A bad rash all over body  . Dizziness and weakness   Immunizations Administered    Name Date Dose VIS Date Route   Moderna COVID-19 Vaccine 05/05/2019  8:17 AM 0.5 mL 01/03/2019 Intramuscular   Manufacturer: Moderna   Lot: [id number]   [id number]

## 2019-05-25 ENCOUNTER — Other Ambulatory Visit: Payer: Self-pay | Admitting: Family Medicine

## 2019-06-09 ENCOUNTER — Ambulatory Visit: Payer: PRIVATE HEALTH INSURANCE | Attending: Internal Medicine

## 2019-06-09 ENCOUNTER — Ambulatory Visit: Payer: Self-pay

## 2019-06-09 DIAGNOSIS — Z23 Encounter for immunization: Secondary | ICD-10-CM

## 2019-06-09 NOTE — Progress Notes (Signed)
   Covid-19 Vaccination Clinic  Name:  Roy Lawrence    MRN: [id number] DOB: 1971-01-21  06/09/2019  [name] was observed post Covid-19 immunization for 15 minutes without incident. He was provided with Vaccine Information Sheet and instruction to access the V-Safe system.   [name] was instructed to call 911 with any severe reactions post vaccine: Marland Kitchen Difficulty breathing  . Swelling of face and throat  . A fast heartbeat  . A bad rash all over body  . Dizziness and weakness   Immunizations Administered    Name Date Dose VIS Date Route   Moderna COVID-19 Vaccine 06/09/2019  8:30 AM 0.5 mL 01/2019 Intramuscular   Manufacturer: Moderna   Lot: [id number]   [id number]

## 2019-07-01 ENCOUNTER — Other Ambulatory Visit: Payer: Self-pay | Admitting: Family Medicine

## 2019-08-04 ENCOUNTER — Other Ambulatory Visit: Payer: Self-pay | Admitting: Family Medicine

## 2019-08-08 ENCOUNTER — Other Ambulatory Visit: Payer: Self-pay | Admitting: Family Medicine

## 2019-08-23 ENCOUNTER — Other Ambulatory Visit: Payer: Self-pay | Admitting: Family Medicine

## 2019-09-14 ENCOUNTER — Other Ambulatory Visit: Payer: Self-pay | Admitting: Family Medicine

## 2019-09-22 ENCOUNTER — Other Ambulatory Visit: Payer: Self-pay | Admitting: Family Medicine

## 2019-10-08 IMAGING — DX DG KNEE 1-2V*L*
2 series · 2 of 2 positions shown · non-contrast
Comparison: None.

CLINICAL DATA: Left knee pain while working out.

EXAM:
LEFT KNEE - 1-2 VIEW

[knee ap]
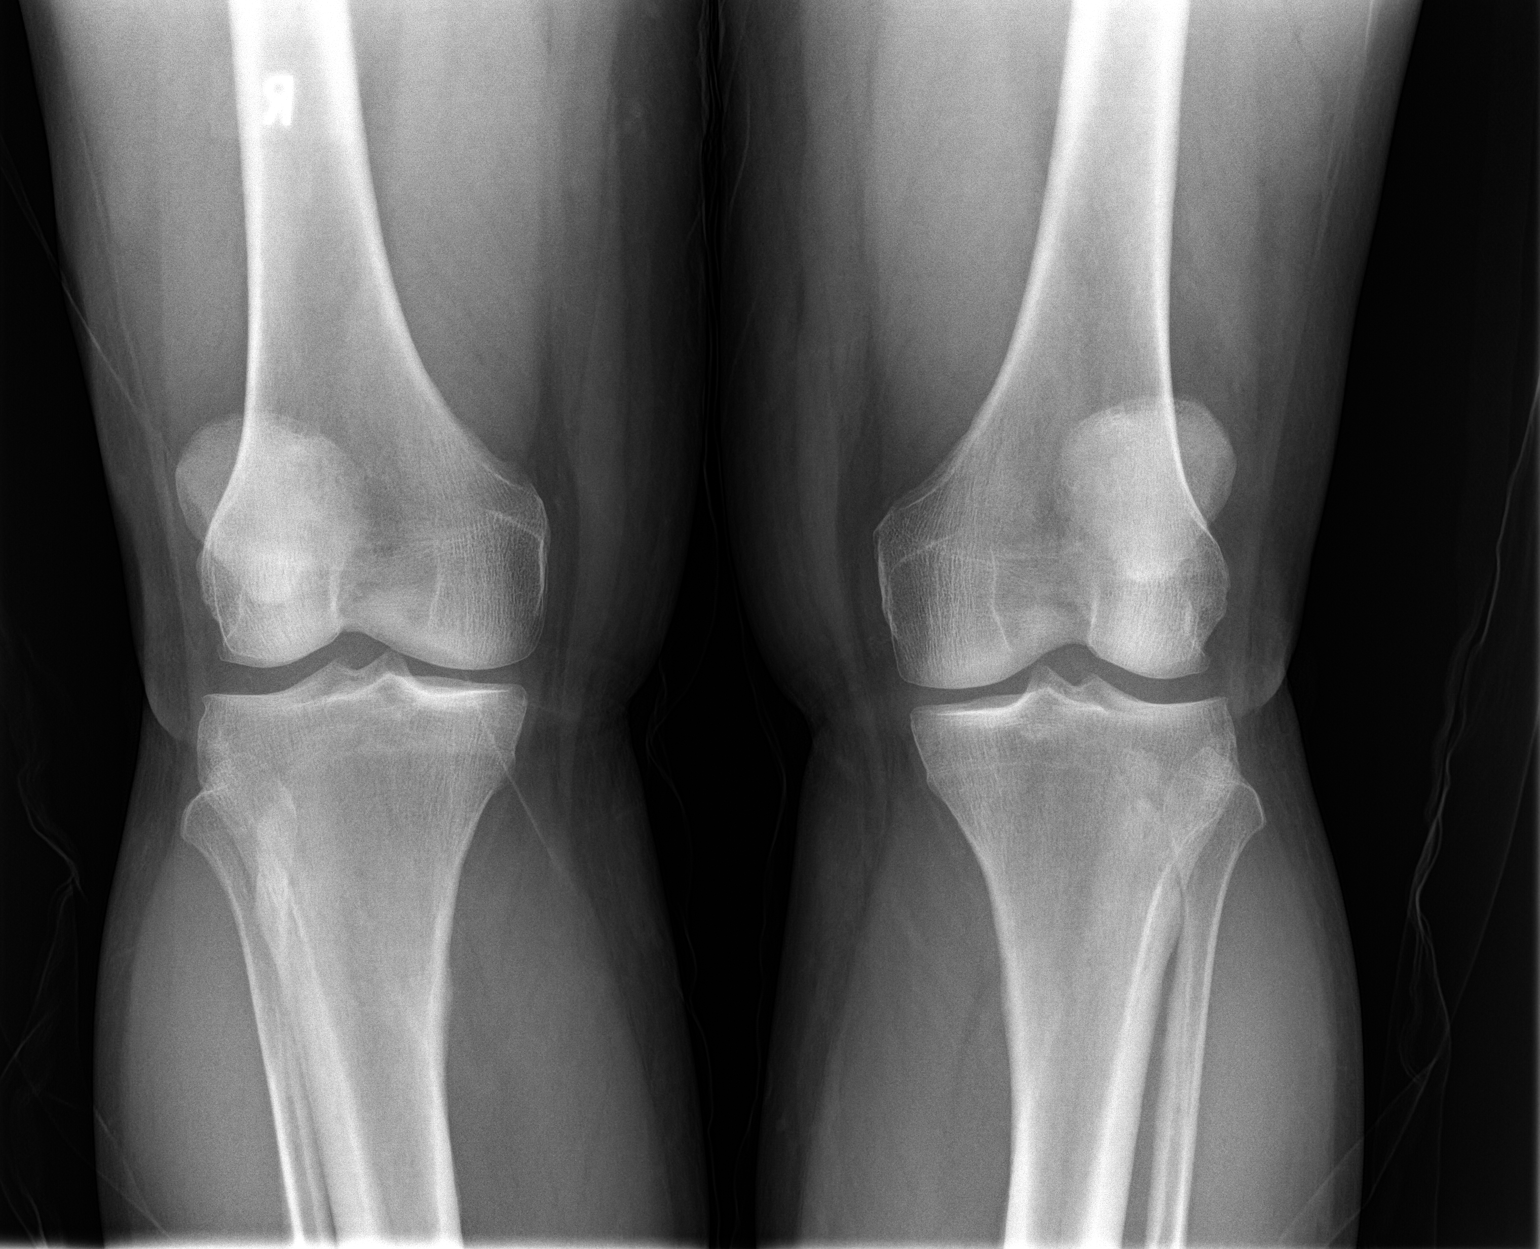

[knee lat]
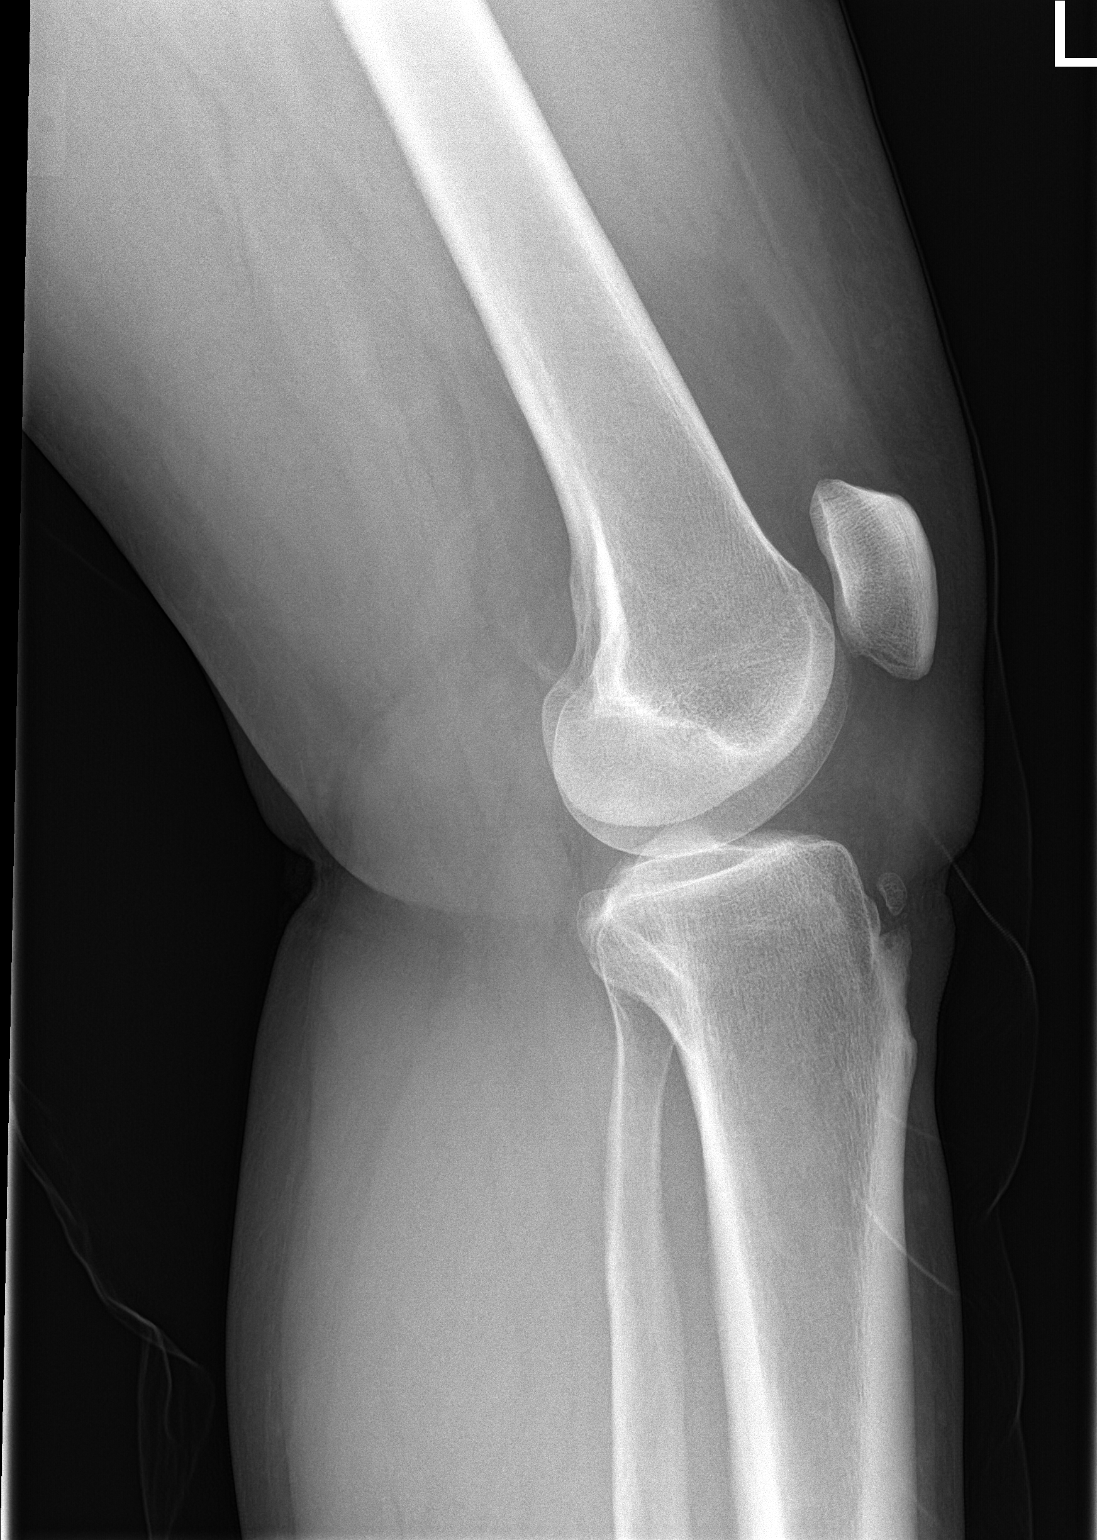

[2 of 2 positions shown; findings below may reference images not displayed]

FINDINGS: No acute fracture or dislocation.

There is a well corticated ossicle measuring approximately 0.9 x
cm superior to the tibial tuberosity which is favored to represent
the sequela remote avulsive injury (Osgood-Schlatter's disease). No
definitive patella Alta deformity. No joint effusion. Joint spaces
are preserved. No evidence of chondrocalcinosis.

Solitary AP projection of the contralateral right knee is normal.

Regional soft tissues appear normal.
IMPRESSION: 1. No definite acute findings.
2. Suspected sequela of prior Osgood-Schlatter's disease as above.

## 2019-10-12 ENCOUNTER — Other Ambulatory Visit: Payer: Self-pay

## 2019-10-12 ENCOUNTER — Ambulatory Visit (INDEPENDENT_AMBULATORY_CARE_PROVIDER_SITE_OTHER): Payer: PRIVATE HEALTH INSURANCE | Admitting: Family Medicine

## 2019-10-12 ENCOUNTER — Encounter: Payer: Self-pay | Admitting: Family Medicine

## 2019-10-12 VITALS — BP 134/85 | HR 54 | Temp 97.6°F | Ht 69.0 in | Wt 261.6 lb

## 2019-10-12 DIAGNOSIS — E559 Vitamin D deficiency, unspecified: Secondary | ICD-10-CM

## 2019-10-12 DIAGNOSIS — K222 Esophageal obstruction: Secondary | ICD-10-CM

## 2019-10-12 DIAGNOSIS — K219 Gastro-esophageal reflux disease without esophagitis: Secondary | ICD-10-CM

## 2019-10-12 DIAGNOSIS — D696 Thrombocytopenia, unspecified: Secondary | ICD-10-CM

## 2019-10-12 DIAGNOSIS — I10 Essential (primary) hypertension: Secondary | ICD-10-CM | POA: Diagnosis not present

## 2019-10-12 DIAGNOSIS — M4316 Spondylolisthesis, lumbar region: Secondary | ICD-10-CM

## 2019-10-12 DIAGNOSIS — E782 Mixed hyperlipidemia: Secondary | ICD-10-CM

## 2019-10-12 DIAGNOSIS — Z125 Encounter for screening for malignant neoplasm of prostate: Secondary | ICD-10-CM

## 2019-10-12 DIAGNOSIS — Z0000 Encounter for general adult medical examination without abnormal findings: Secondary | ICD-10-CM

## 2019-10-12 DIAGNOSIS — Z0001 Encounter for general adult medical examination with abnormal findings: Secondary | ICD-10-CM | POA: Diagnosis not present

## 2019-10-12 DIAGNOSIS — E349 Endocrine disorder, unspecified: Secondary | ICD-10-CM

## 2019-10-12 DIAGNOSIS — F419 Anxiety disorder, unspecified: Secondary | ICD-10-CM

## 2019-10-12 LAB — URINALYSIS
Bilirubin, UA: NEGATIVE
Glucose, UA: NEGATIVE
Ketones, UA: NEGATIVE
Leukocytes,UA: NEGATIVE
Nitrite, UA: NEGATIVE
Protein,UA: NEGATIVE
RBC, UA: NEGATIVE
Specific Gravity, UA: 1.02 (ref 1.005–1.030)
Urobilinogen, Ur: 0.2 mg/dL (ref 0.2–1.0)
pH, UA: 7 (ref 5.0–7.5)

## 2019-10-12 MED ORDER — TRAZODONE HCL 150 MG PO TABS: ORAL_TABLET | ORAL | 0 refills | Status: DC

## 2019-10-12 MED ORDER — BUDESONIDE-FORMOTEROL FUMARATE 160-4.5 MCG/ACT IN AERO: INHALATION_SPRAY | RESPIRATORY_TRACT | 3 refills | Status: DC

## 2019-10-12 MED ORDER — ALBUTEROL SULFATE HFA 108 (90 BASE) MCG/ACT IN AERS: 1.0000 | INHALATION_SPRAY | Freq: Four times a day (QID) | RESPIRATORY_TRACT | 3 refills | Status: DC | PRN

## 2019-10-12 MED ORDER — NEBIVOLOL HCL 10 MG PO TABS: 10.0000 mg | ORAL_TABLET | Freq: Two times a day (BID) | ORAL | 1 refills | Status: DC

## 2019-10-12 MED ORDER — PREDNISONE 10 MG PO TABS: ORAL_TABLET | ORAL | 0 refills | Status: DC

## 2019-10-12 MED ORDER — RIZATRIPTAN BENZOATE 10 MG PO TBDP: 10.0000 mg | ORAL_TABLET | ORAL | 11 refills | Status: DC | PRN

## 2019-10-12 MED ORDER — TESTOSTERONE 30 MG/ACT TD SOLN: TRANSDERMAL | 5 refills | Status: DC

## 2019-10-12 MED ORDER — ESOMEPRAZOLE MAGNESIUM 20 MG PO CPDR: DELAYED_RELEASE_CAPSULE | ORAL | 3 refills | Status: DC

## 2019-10-12 MED ORDER — FEXOFENADINE HCL 180 MG PO TABS
180.0000 mg | ORAL_TABLET | Freq: Every day | ORAL | 11 refills | Status: DC
Start: 2019-10-12 — End: 2020-10-10

## 2019-10-12 NOTE — Progress Notes (Signed)
Subjective:  Patient ID: Roy Lawrence, male    DOB: 02-11-1970  Age: 49 y.o. MRN: [id number]  CC: Annual Exam   HPI Roy Lawrence presents for annual physical. Increasing HA, hx migraine. Also dyspnea, fatigue and cough persisting  since  Since CoVID infection of 12/20  Depression screen [facility] 2/9 10/12/2019 12/19/2018 09/13/2015  Decreased Interest 0 0 0  Down, Depressed, Hopeless 0 0 0  PHQ - 2 Score 0 0 0  Some encounter information is confidential and restricted. Go to Review Flowsheets activity to see all data.    History Roy Lawrence has a past medical history of Allergy, Anxiety, Asthma, Depression, Diverticulitis, Diverticulosis, Esophageal stricture, GERD (gastroesophageal reflux disease), Hypertension, Obesity, and Spondylosis.   He has a past surgical history that includes Shoulder surgery (Right); Finger surgery (Right); and Esophageal dilation.   His family history includes Asthma in his mother; Dementia in his maternal grandfather and maternal grandmother; Diabetes in his father; Diverticulitis in his mother; GER disease in his mother; Berenice Primas' disease in his father; Hodgkin's lymphoma in his father; Hyperlipidemia in his mother; Hypertension in his maternal grandfather, maternal grandmother, and mother; Thyroid disease in his father and sister.He reports that he has never smoked. He has never used smokeless tobacco. He reports current alcohol use. He reports that he does not use drugs.    ROS Review of Systems  Constitutional: Positive for fatigue. Negative for activity change and unexpected weight change.  HENT: Negative for congestion, ear pain, hearing loss, postnasal drip and trouble swallowing.   Eyes: Negative for pain and visual disturbance.  Respiratory: Positive for cough, shortness of breath and wheezing. Negative for chest tightness.   Cardiovascular: Negative for chest pain, palpitations and leg swelling.  Gastrointestinal: Negative for abdominal distention,  abdominal pain, blood in stool, constipation, diarrhea, nausea and vomiting.  Endocrine: Negative for cold intolerance, heat intolerance and polydipsia.  Genitourinary: Negative for difficulty urinating, dysuria, flank pain, frequency and urgency.  Musculoskeletal: Negative for arthralgias and joint swelling.  Skin: Negative for color change, rash and wound.  Neurological: Negative for dizziness, syncope, speech difficulty, weakness, light-headedness, numbness and headaches (iNCREASING SEVERITY AND FREQUENCY. rELATES TO WORK STRESS/SCHEDULE).  Hematological: Does not bruise/bleed easily.  Psychiatric/Behavioral: Negative for confusion, decreased concentration, dysphoric mood and sleep disturbance. The patient is not nervous/anxious.     Objective:  BP 134/85    Pulse (!) 54    Temp 97.6 F (36.4 C) (Temporal)    Ht 5' 9"  (1.753 m)    Wt 261 lb 9.6 oz (118.7 kg)    BMI 38.63 kg/m   BP Readings from Last 3 Encounters:  10/12/19 134/85  12/19/18 113/75  09/19/15 131/91    Wt Readings from Last 3 Encounters:  10/12/19 261 lb 9.6 oz (118.7 kg)  12/19/18 252 lb (114.3 kg)  09/13/15 251 lb (113.9 kg)     Physical Exam Constitutional:      Appearance: He is well-developed.  HENT:     Head: Normocephalic and atraumatic.  Eyes:     Pupils: Pupils are equal, round, and reactive to light.  Neck:     Thyroid: No thyromegaly.     Trachea: No tracheal deviation.  Cardiovascular:     Rate and Rhythm: Normal rate and regular rhythm.     Heart sounds: Normal heart sounds. No murmur heard.  No friction rub. No gallop.   Pulmonary:     Breath sounds: Normal breath sounds. No wheezing or rales.  Abdominal:  General: Bowel sounds are normal. There is no distension.     Palpations: Abdomen is soft. There is no mass.     Tenderness: There is no abdominal tenderness.     Hernia: There is no hernia in the left inguinal area.  Genitourinary:    Penis: Normal.      Testes: Normal.    Musculoskeletal:        General: Normal range of motion.     Cervical back: Normal range of motion.  Lymphadenopathy:     Cervical: No cervical adenopathy.  Skin:    General: Skin is warm and dry.  Neurological:     Mental Status: He is alert and oriented to person, place, and time.       Assessment & Plan:   Roy Lawrence was seen today for annual exam.  Diagnoses and all orders for this visit:  ESOPHAGEAL STRICTURE -     CBC with Differential/Platelet -     CMP14+EGFR  Gastroesophageal reflux disease, unspecified whether esophagitis present -     CBC with Differential/Platelet -     CMP14+EGFR  Anxiety -     CBC with Differential/Platelet -     CMP14+EGFR  Essential hypertension -     CBC with Differential/Platelet -     CMP14+EGFR -     Urinalysis  Spondylolisthesis of lumbar region -     CBC with Differential/Platelet -     CMP14+EGFR  Vitamin D deficiency -     CBC with Differential/Platelet -     CMP14+EGFR -     VITAMIN D 25 Hydroxy (Vit-D Deficiency, Fractures)  Mixed hyperlipidemia -     CBC with Differential/Platelet -     CMP14+EGFR -     Lipid panel  Thrombocytopenia (HCC) -     CBC with Differential/Platelet -     CMP14+EGFR  Testosterone deficiency -     Testosterone,Free and Total  Well adult exam  Screening for prostate cancer -     PSA Total (Reflex To Free)  Other orders -     Testosterone (AXIRON) 30 MG/ACT SOLN; APPLY TWO APPLICATIONS OF SOLUTION TOPICALLY ONCE DAILY TO EACH ARM -     traZODone (DESYREL) 150 MG tablet; USE 1/3 TO ONE TABLET NIGHTLY AS NEEDED FOR SLEEP -     esomeprazole (NEXIUM 24HR) 20 MG capsule; TAKE ONE CAPSULE BY MOUTH TWO TIMES DAILY -     nebivolol (BYSTOLIC) 10 MG tablet; Take 1 tablet (10 mg total) by mouth 2 (two) times daily. (Needs to be seen before next refill) -     budesonide-formoterol (SYMBICORT) 160-4.5 MCG/ACT inhaler; INHALE TWO PUFFS BY MOUTH TWICE DAILY, RINSE MOUTH AFTER USING -     albuterol  (VENTOLIN HFA) 108 (90 Base) MCG/ACT inhaler; Inhale 1 puff into the lungs every 6 (six) hours as needed for wheezing. -     fexofenadine (ALLEGRA) 180 MG tablet; Take 1 tablet (180 mg total) by mouth daily. For allergy symptoms -     rizatriptan (MAXALT-MLT) 10 MG disintegrating tablet; Take 1 tablet (10 mg total) by mouth as needed for migraine. May repeat in 2 hours if needed -     predniSONE (DELTASONE) 10 MG tablet; Take 5 daily for 3 days followed by 4,3,2 and 1 for 3 days each.       I have discontinued Savan Ruta. Mcpeters's fluticasone, ALPRAZolam, sildenafil, meclizine, azithromycin, and benzonatate. I have also changed his NexIUM 24HR to esomeprazole. Additionally, I am having him start  on rizatriptan and predniSONE. Lastly, I am having him maintain his Magnesium Oxide (MAG-OX Lawrence), Vitamin D3, acetaminophen, b complex vitamins, diphenhydrAMINE, ondansetron, Testosterone, traZODone, nebivolol, budesonide-formoterol, albuterol, and fexofenadine.  Allergies as of 10/12/2019      Reactions   Diovan [valsartan] Swelling   Quinapril Hcl Swelling      Medication List       Accurate as of October 12, 2019  5:11 PM. If you have any questions, ask your nurse or doctor.        STOP taking these medications   ALPRAZolam 0.5 MG tablet Commonly known as: XANAX Stopped by: Claretta Fraise, MD   azithromycin 250 MG tablet Commonly known as: Zithromax Z-Pak Stopped by: Claretta Fraise, MD   benzonatate 200 MG capsule Commonly known as: TESSALON Stopped by: Claretta Fraise, MD   fluticasone 50 MCG/ACT nasal spray Commonly known as: FLONASE Stopped by: Claretta Fraise, MD   meclizine 12.5 MG tablet Commonly known as: ANTIVERT Stopped by: Claretta Fraise, MD   sildenafil 100 MG tablet Commonly known as: VIAGRA Stopped by: Claretta Fraise, MD     TAKE these medications   acetaminophen 325 MG tablet Commonly known as: TYLENOL Take 650 mg by mouth every 6 (six) hours as needed for pain.     albuterol 108 (90 Base) MCG/ACT inhaler Commonly known as: Ventolin HFA Inhale 1 puff into the lungs every 6 (six) hours as needed for wheezing.   b complex vitamins tablet Take 1 tablet by mouth daily.   budesonide-formoterol 160-4.5 MCG/ACT inhaler Commonly known as: Symbicort INHALE TWO PUFFS BY MOUTH TWICE DAILY, RINSE MOUTH AFTER USING   diphenhydrAMINE 25 mg capsule Commonly known as: BENADRYL Take 25 mg by mouth every 6 (six) hours as needed.   esomeprazole 20 MG capsule Commonly known as: NexIUM 24HR TAKE ONE CAPSULE BY MOUTH TWO TIMES DAILY   fexofenadine 180 MG tablet Commonly known as: ALLEGRA Take 1 tablet (180 mg total) by mouth daily. For allergy symptoms   MAG-OX Lawrence Take 1 tablet by mouth daily.   nebivolol 10 MG tablet Commonly known as: Bystolic Take 1 tablet (10 mg total) by mouth 2 (two) times daily. (Needs to be seen before next refill)   ondansetron 4 MG disintegrating tablet Commonly known as: ZOFRAN-ODT DISSOLVE ONE TABLET UNDER THE TONGUE EVERY 4 HOURS AS NEEDED FOR NAUSEA AND VOMITING   predniSONE 10 MG tablet Commonly known as: DELTASONE Take 5 daily for 3 days followed by 4,3,2 and 1 for 3 days each. Started by: Claretta Fraise, MD   rizatriptan 10 MG disintegrating tablet Commonly known as: Maxalt-MLT Take 1 tablet (10 mg total) by mouth as needed for migraine. May repeat in 2 hours if needed Started by: Claretta Fraise, MD   Testosterone 30 MG/ACT Soln Commonly known as: Axiron APPLY TWO APPLICATIONS OF SOLUTION TOPICALLY ONCE DAILY TO EACH ARM   traZODone 150 MG tablet Commonly known as: DESYREL USE 1/3 TO ONE TABLET NIGHTLY AS NEEDED FOR SLEEP   Vitamin D3 125 MCG (5000 UT) Caps Take 5,000 Units by mouth daily.        Follow-up: No follow-ups on file.  Claretta Fraise, M.D.

## 2019-10-18 ENCOUNTER — Other Ambulatory Visit: Payer: PRIVATE HEALTH INSURANCE

## 2019-10-18 ENCOUNTER — Other Ambulatory Visit: Payer: Self-pay

## 2019-10-18 DIAGNOSIS — Z125 Encounter for screening for malignant neoplasm of prostate: Secondary | ICD-10-CM

## 2019-10-18 DIAGNOSIS — Z0000 Encounter for general adult medical examination without abnormal findings: Secondary | ICD-10-CM

## 2019-10-18 NOTE — Addendum Note (Signed)
Addended by: Michaela Corner on: 10/18/2019 09:28 AM   Modules accepted: Orders

## 2019-10-18 NOTE — Addendum Note (Signed)
Addended by: Michaela Corner on: 10/18/2019 09:12 AM   Modules accepted: Orders

## 2019-10-21 LAB — CMP14+EGFR
ALT: 69 IU/L — ABNORMAL HIGH (ref 0–44)
AST: 32 IU/L (ref 0–40)
Albumin/Globulin Ratio: 2 (ref 1.2–2.2)
Albumin: 4.7 g/dL (ref 3.8–4.9)
Alkaline Phosphatase: 63 IU/L (ref 44–121)
BUN/Creatinine Ratio: 12 (ref 9–20)
BUN: 13 mg/dL (ref 6–24)
Bilirubin Total: 0.5 mg/dL (ref 0.0–1.2)
CO2: 24 mmol/L (ref 20–29)
Calcium: 9.7 mg/dL (ref 8.7–10.2)
Chloride: 100 mmol/L (ref 96–106)
Creatinine, Ser: 1.13 mg/dL (ref 0.76–1.27)
GFR calc Af Amer: 87 mL/min/{1.73_m2} (ref >59)
GFR calc non Af Amer: 75 mL/min/{1.73_m2} (ref >59)
Globulin, Total: 2.3 g/dL (ref 1.5–4.5)
Glucose: 93 mg/dL (ref 65–99)
Potassium: 4.4 mmol/L (ref 3.5–5.2)
Sodium: 140 mmol/L (ref 134–144)
Total Protein: 7 g/dL (ref 6.0–8.5)

## 2019-10-21 LAB — CBC WITH DIFFERENTIAL/PLATELET
Basophils Absolute: 0 10*3/uL (ref 0.0–0.2)
Basos: 1 %
EOS (ABSOLUTE): 0.1 10*3/uL (ref 0.0–0.4)
Eos: 2 %
Hematocrit: 46.1 % (ref 37.5–51.0)
Hemoglobin: 15.8 g/dL (ref 13.0–17.7)
Immature Grans (Abs): 0 10*3/uL (ref 0.0–0.1)
Immature Granulocytes: 0 %
Lymphocytes Absolute: 1.8 10*3/uL (ref 0.7–3.1)
Lymphs: 37 %
MCH: 29.5 pg (ref 26.6–33.0)
MCHC: 34.3 g/dL (ref 31.5–35.7)
MCV: 86 fL (ref 79–97)
Monocytes Absolute: 0.3 10*3/uL (ref 0.1–0.9)
Monocytes: 7 %
Neutrophils Absolute: 2.5 10*3/uL (ref 1.4–7.0)
Neutrophils: 53 %
Platelets: 127 10*3/uL — ABNORMAL LOW (ref 150–450)
RBC: 5.36 x10E6/uL (ref 4.14–5.80)
RDW: 12.5 % (ref 11.6–15.4)
WBC: 4.8 10*3/uL (ref 3.4–10.8)

## 2019-10-21 LAB — PSA, TOTAL AND FREE
PSA, Free Pct: 24.6 %
PSA, Free: 0.32 ng/mL
Prostate Specific Ag, Serum: 1.3 ng/mL (ref 0.0–4.0)

## 2019-10-21 LAB — TESTOSTERONE,FREE AND TOTAL
Testosterone, Free: 6.2 pg/mL — ABNORMAL LOW (ref 7.2–24.0)
Testosterone: 239 ng/dL — ABNORMAL LOW (ref 264–916)

## 2019-10-21 LAB — LIPID PANEL
Chol/HDL Ratio: 5.1 ratio — ABNORMAL HIGH (ref 0.0–5.0)
Cholesterol, Total: 183 mg/dL (ref 100–199)
HDL: 36 mg/dL — ABNORMAL LOW (ref >39)
LDL Chol Calc (NIH): 107 mg/dL — ABNORMAL HIGH (ref 0–99)
Triglycerides: 234 mg/dL — ABNORMAL HIGH (ref 0–149)
VLDL Cholesterol Cal: 40 mg/dL (ref 5–40)

## 2019-10-21 LAB — VITAMIN D 25 HYDROXY (VIT D DEFICIENCY, FRACTURES): Vit D, 25-Hydroxy: 70.1 ng/mL (ref 30.0–100.0)

## 2019-11-13 ENCOUNTER — Other Ambulatory Visit: Payer: Self-pay | Admitting: Family Medicine

## 2019-12-13 ENCOUNTER — Other Ambulatory Visit: Payer: Self-pay | Admitting: Family Medicine

## 2020-01-31 ENCOUNTER — Other Ambulatory Visit: Payer: Self-pay

## 2020-01-31 ENCOUNTER — Ambulatory Visit (INDEPENDENT_AMBULATORY_CARE_PROVIDER_SITE_OTHER): Payer: PRIVATE HEALTH INSURANCE

## 2020-01-31 ENCOUNTER — Ambulatory Visit (INDEPENDENT_AMBULATORY_CARE_PROVIDER_SITE_OTHER): Payer: PRIVATE HEALTH INSURANCE | Admitting: Family Medicine

## 2020-01-31 ENCOUNTER — Encounter: Payer: Self-pay | Admitting: Family Medicine

## 2020-01-31 VITALS — BP 134/74 | HR 71 | Temp 98.1°F | Ht 69.0 in | Wt 268.5 lb

## 2020-01-31 DIAGNOSIS — R1032 Left lower quadrant pain: Secondary | ICD-10-CM

## 2020-01-31 DIAGNOSIS — R1013 Epigastric pain: Secondary | ICD-10-CM

## 2020-01-31 NOTE — Progress Notes (Signed)
Subjective: CC: abdominal pain PCP: Roy Fraise, MD  Roy Lawrence is a 49 y.o. male presenting to clinic today for:  1. Abdominal pain Roy Lawrence reports stomach pain down the center of his stomach for 1 month. He also has pain in his LLQ at times. The pain down the center of his abdomen is more tenderness and really only hurts if it is touched, then it is a sharp pain. The pain in his LLQ is a more lingering dull pain. 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 Pain is moderate. Reports nausea once or twice. Denies fever, vomiting, constipation, or diarrhea. He has had diverticulitis in the past, but this does not feel the same. History of acid reflux that is well controlled with nexium BID. Denies changes in stool color or consistency. Pain does not seem related to eating. Denies increased belching or flatus. Denies bloating. Denies flank pain or dysuria. Denies new medications. He has seen a GI before at [facility] (Dr. Henrene Lawrence) for GERD, colonoscopy, and esophogeal strictures. He is concerned because his father died from aggressive liver cancer.   Relevant past medical, surgical, family, and social history reviewed and updated as indicated.  Allergies and medications reviewed and updated.  Allergies  Allergen Reactions  . Diovan [Valsartan] Swelling  . Quinapril Hcl Swelling   Past Medical History:  Diagnosis Date  . Allergy    SEASONAL  . Anxiety   . Asthma   . Depression   . Diverticulitis   . Diverticulosis   . Esophageal stricture   . GERD (gastroesophageal reflux disease)   . Hypertension    Prehypertension  . Obesity   . Spondylosis     Current Outpatient Medications:  .  acetaminophen (TYLENOL) 325 MG tablet, Take 650 mg by mouth every 6 (six) hours as needed for pain., Disp: , Rfl:  .  albuterol (VENTOLIN HFA) 108 (90 Base) MCG/ACT inhaler, Inhale 1 puff into the lungs every 6 (six) hours as needed for wheezing., Disp: 18 each, Rfl: 3 .  b complex vitamins tablet, Take 1 tablet by  mouth daily., Disp: , Rfl:  .  budesonide-formoterol (SYMBICORT) 160-4.5 MCG/ACT inhaler, INHALE TWO PUFFS BY MOUTH TWICE DAILY, RINSE MOUTH AFTER USING, Disp: 30.6 g, Rfl: 3 .  Cholecalciferol (VITAMIN D3) 5000 UNITS CAPS, Take 5,000 Units by mouth daily. , Disp: , Rfl:  .  diphenhydrAMINE (BENADRYL) 25 mg capsule, Take 25 mg by mouth every 6 (six) hours as needed., Disp: , Rfl:  .  esomeprazole (NEXIUM 24HR) 20 MG capsule, TAKE ONE CAPSULE BY MOUTH TWO TIMES DAILY, Disp: 180 capsule, Rfl: 3 .  fexofenadine (ALLEGRA) 180 MG tablet, Take 1 tablet (180 mg total) by mouth daily. For allergy symptoms, Disp: 30 tablet, Rfl: 11 .  Magnesium Oxide (MAG-OX PO), Take 1 tablet by mouth daily. , Disp: , Rfl:  .  Melatonin 10 MG TABS, Take 70 mg by mouth., Disp: , Rfl:  .  nebivolol (BYSTOLIC) 10 MG tablet, Take 1 tablet (10 mg total) by mouth 2 (two) times daily. (Needs to be seen before next refill), Disp: 180 tablet, Rfl: 1 .  ondansetron (ZOFRAN-ODT) 4 MG disintegrating tablet, DISSOLVE ONE TABLET UNDER THE TONGUE EVERY 4 HOURS AS NEEDED FOR NAUSEA AND VOMITING, Disp: 12 tablet, Rfl: 0 .  rizatriptan (MAXALT-MLT) 10 MG disintegrating tablet, Take 1 tablet (10 mg total) by mouth as needed for migraine. May repeat in 2 hours if needed, Disp: 10 tablet, Rfl: 11 .  Testosterone (AXIRON) 30  MG/ACT SOLN, APPLY TWO APPLICATIONS OF SOLUTION TOPICALLY ONCE DAILY TO EACH ARM, Disp: 180 mL, Rfl: 5 .  traZODone (DESYREL) 150 MG tablet, TAKE 1/3 TO ONE TABLET BY MOUTH NIGHTLY FOR SLEEP, Disp: 90 tablet, Rfl: 1 Social History   Socioeconomic History  . Marital status: Significant Other    Spouse name: Misty  . Number of children: 0  . Years of education: 2 yrs  . Highest education level: Not on file  Occupational History  . Occupation: Chemical engineer: [employer]  Tobacco Use  . Smoking status: Never Smoker  . Smokeless tobacco: Never Used  Vaping Use  . Vaping Use: Never used  Substance and  Sexual Activity  . Alcohol use: Yes    Alcohol/week: 0.0 standard drinks    Comment: rarely less than 1 drink a week  . Drug use: No  . Sexual activity: Not on file  Other Topics Concern  . Not on file  Social History Narrative   Patient lives at home with his spouse.  He has two children they have at home they are helping to raise.     Caffeine Use: 2-3 daily   Social Determinants of Health   Financial Resource Strain: Not on file  Food Insecurity: Not on file  Transportation Needs: Not on file  Physical Activity: Not on file  Stress: Not on file  Social Connections: Not on file  Intimate Partner Violence: Not on file   Family History  Problem Relation Age of Onset  . Hyperlipidemia Mother   . Hypertension Mother   . GER disease Mother   . Diverticulitis Mother        partial colectomy due to this  . Asthma Mother   . Hodgkin's lymphoma Father   . Diabetes Father   . Thyroid disease Father   . Graves' disease Father   . Thyroid disease Sister   . Dementia Maternal Grandmother   . Hypertension Maternal Grandmother   . Dementia Maternal Grandfather   . Hypertension Maternal Grandfather   . Colon cancer Neg Hx     Review of Systems  As per HPI.  Objective: Office vital signs reviewed. BP 134/74   Pulse 71   Temp 98.1 F (36.7 C) (Temporal)   Ht 5' 9"  (1.753 m)   Wt 268 lb 8 oz (121.8 kg)   BMI 39.65 kg/m   Physical Examination:  Physical Exam Vitals and nursing note reviewed.  Constitutional:      General: He is not in acute distress.    Appearance: He is obese. He is not ill-appearing, toxic-appearing or diaphoretic.  Cardiovascular:     Rate and Rhythm: Normal rate and regular rhythm.     Heart sounds: Normal heart sounds. No murmur heard.   Pulmonary:     Effort: No respiratory distress.     Breath sounds: Normal breath sounds.  Abdominal:     General: Abdomen is protuberant. Bowel sounds are normal. There is no distension. There are no signs of  injury.     Palpations: Abdomen is soft. There is no fluid wave, mass or pulsatile mass.     Tenderness: There is abdominal tenderness in the epigastric area. There is no right CVA tenderness, left CVA tenderness, guarding or rebound. Negative signs include Murphy's sign, McBurney's sign and obturator sign.     Hernia: No hernia is present.    Skin:    General: Skin is warm and dry.  Neurological:  General: No focal deficit present.     Mental Status: He is alert and oriented to person, place, and time.  Psychiatric:        Mood and Affect: Mood normal.        Behavior: Behavior normal.      Results for orders placed or performed in visit on 10/18/19  VITAMIN D 25 Hydroxy (Vit-D Deficiency, Fractures)  Result Value Ref Range   Vit D, 25-Hydroxy 70.1 30.0 - 100.0 ng/mL  Testosterone,Free and Total  Result Value Ref Range   Testosterone 239 (L) 264 - 916 ng/dL   Testosterone, Free 6.2 (L) 7.2 - 24.0 pg/mL  PSA, total and free  Result Value Ref Range   Prostate Specific Ag, Serum 1.3 0.0 - 4.0 ng/mL   PSA, Free 0.32 N/A ng/mL   PSA, Free Pct 24.6 %  Lipid panel  Result Value Ref Range   Cholesterol, Total 183 100 - 199 mg/dL   Triglycerides 234 (H) 0 - 149 mg/dL   HDL 36 (L) >39 mg/dL   VLDL Cholesterol Cal 40 5 - 40 mg/dL   LDL Chol Calc (NIH) 107 (H) 0 - 99 mg/dL   Chol/HDL Ratio 5.1 (H) 0.0 - 5.0 ratio  CMP14+EGFR  Result Value Ref Range   Glucose 93 65 - 99 mg/dL   BUN 13 6 - 24 mg/dL   Creatinine, Ser 1.13 0.76 - 1.27 mg/dL   GFR calc non Af Amer 75 >59 mL/min/1.73   GFR calc Af Amer 87 >59 mL/min/1.73   BUN/Creatinine Ratio 12 9 - 20   Sodium 140 134 - 144 mmol/L   Potassium 4.4 3.5 - 5.2 mmol/L   Chloride 100 96 - 106 mmol/L   CO2 24 20 - 29 mmol/L   Calcium 9.7 8.7 - 10.2 mg/dL   Total Protein 7.0 6.0 - 8.5 g/dL   Albumin 4.7 3.8 - 4.9 g/dL   Globulin, Total 2.3 1.5 - 4.5 g/dL   Albumin/Globulin Ratio 2.0 1.2 - 2.2   Bilirubin Total 0.5 0.0 - 1.2 mg/dL    Alkaline Phosphatase 63 44 - 121 IU/L   AST 32 0 - 40 IU/L   ALT 69 (H) 0 - 44 IU/L  CBC with Differential/Platelet  Result Value Ref Range   WBC 4.8 3.4 - 10.8 x10E3/uL   RBC 5.36 4.14 - 5.80 x10E6/uL   Hemoglobin 15.8 13.0 - 17.7 g/dL   Hematocrit 46.1 37.5 - 51.0 %   MCV 86 79 - 97 fL   MCH 29.5 26.6 - 33.0 pg   MCHC 34.3 31.5 - 35.7 g/dL   RDW 12.5 11.6 - 15.4 %   Platelets 127 (L) 150 - 450 x10E3/uL   Neutrophils 53 Not Estab. %   Lymphs 37 Not Estab. %   Monocytes 7 Not Estab. %   Eos 2 Not Estab. %   Basos 1 Not Estab. %   Neutrophils Absolute 2.5 1.4 - 7.0 x10E3/uL   Lymphocytes Absolute 1.8 0.7 - 3.1 x10E3/uL   Monocytes Absolute 0.3 0.1 - 0.9 x10E3/uL   EOS (ABSOLUTE) 0.1 0.0 - 0.4 x10E3/uL   Basophils Absolute 0.0 0.0 - 0.2 x10E3/uL   Immature Granulocytes 0 Not Estab. %   Immature Grans (Abs) 0.0 0.0 - 0.1 x10E3/uL     Assessment/ Plan: Roy Lawrence was seen today for abdominal pain.  Diagnoses and all orders for this visit:  Epigastric pain Epigastric tenderness on exam. Radiology report pending. Labs pending. Discussed CT scan and/or referral to GI  pending results.  -     DG Abd 1 View -     Hepatic function panel -     CBC with Differential/Platelet -     Lipase -     CMP14+EGFR  LLQ pain Radiology report pending. Labs pending. Discussed CT scan and/or referral to GI pending results.  -     DG Abd 1 View -     CBC with Differential/Platelet -     Lipase -     CMP14+EGFR  Return to office for new or worsening symptoms, or if symptoms persist.   Follow up as needed.   The above assessment and management plan was discussed with the patient. The patient verbalized understanding of and has agreed to the management plan. Patient is aware to call the clinic if symptoms persist or worsen. Patient is aware when to return to the clinic for a follow-up visit. Patient educated on when it is appropriate to go to the emergency department.   Marjorie Smolder,  FNP-C [facility] [address] [place], [place] [address] [phone]

## 2020-01-31 NOTE — Patient Instructions (Signed)
Abdominal Pain, Adult Pain in the abdomen (abdominal pain) can be caused by many things. Often, abdominal pain is not serious and it gets better with no treatment or by being treated at home. However, sometimes abdominal pain is serious. Your health care provider will ask questions about your medical history and do a physical exam to try to determine the cause of your abdominal pain. Follow these instructions at home:  Medicines  Take over-the-counter and prescription medicines only as told by your health care provider.  Do not take a laxative unless told by your health care provider. General instructions  Watch your condition for any changes.  Drink enough fluid to keep your urine pale yellow.  Keep all follow-up visits as told by your health care provider. This is important. Contact a health care provider if:  Your abdominal pain changes or gets worse.  You are not hungry or you lose weight without trying.  You are constipated or have diarrhea for more than 2-3 days.  You have pain when you urinate or have a bowel movement.  Your abdominal pain wakes you up at night.  Your pain gets worse with meals, after eating, or with certain foods.  You are vomiting and cannot keep anything down.  You have a fever.  You have blood in your urine. Get help right away if:  Your pain does not go away as soon as your health care provider told you to expect.  You cannot stop vomiting.  Your pain is only in areas of the abdomen, such as the right side or the left lower portion of the abdomen. Pain on the right side could be caused by appendicitis.  You have bloody or black stools, or stools that look like tar.  You have severe pain, cramping, or bloating in your abdomen.  You have signs of dehydration, such as: ? Dark urine, very little urine, or no urine. ? Cracked lips. ? Dry mouth. ? Sunken eyes. ? Sleepiness. ? Weakness.  You have trouble breathing or chest  pain. Summary  Often, abdominal pain is not serious and it gets better with no treatment or by being treated at home. However, sometimes abdominal pain is serious.  Watch your condition for any changes.  Take over-the-counter and prescription medicines only as told by your health care provider.  Contact a health care provider if your abdominal pain changes or gets worse.  Get help right away if you have severe pain, cramping, or bloating in your abdomen. This information is not intended to replace advice given to you by your health care provider. Make sure you discuss any questions you have with your health care provider. Document Revised: 05/30/2018 Document Reviewed: 05/30/2018 Elsevier Patient Education  2020 [employer]  

## 2020-02-01 ENCOUNTER — Telehealth: Payer: Self-pay | Admitting: Family Medicine

## 2020-02-01 ENCOUNTER — Other Ambulatory Visit: Payer: Self-pay | Admitting: Family Medicine

## 2020-02-01 DIAGNOSIS — R1084 Generalized abdominal pain: Secondary | ICD-10-CM

## 2020-02-01 DIAGNOSIS — R109 Unspecified abdominal pain: Secondary | ICD-10-CM

## 2020-02-01 LAB — CMP14+EGFR
ALT: 68 IU/L — ABNORMAL HIGH (ref 0–44)
AST: 28 IU/L (ref 0–40)
Albumin/Globulin Ratio: 2.5 — ABNORMAL HIGH (ref 1.2–2.2)
Albumin: 4.5 g/dL (ref 3.8–4.9)
Alkaline Phosphatase: 63 IU/L (ref 44–121)
BUN/Creatinine Ratio: 16 (ref 9–20)
BUN: 18 mg/dL (ref 6–24)
Bilirubin Total: 0.4 mg/dL (ref 0.0–1.2)
CO2: 28 mmol/L (ref 20–29)
Calcium: 9.5 mg/dL (ref 8.7–10.2)
Chloride: 102 mmol/L (ref 96–106)
Creatinine, Ser: 1.16 mg/dL (ref 0.76–1.27)
GFR calc Af Amer: 84 mL/min/{1.73_m2} (ref >59)
GFR calc non Af Amer: 73 mL/min/{1.73_m2} (ref >59)
Globulin, Total: 1.8 g/dL (ref 1.5–4.5)
Glucose: 78 mg/dL (ref 65–99)
Potassium: 4.5 mmol/L (ref 3.5–5.2)
Sodium: 141 mmol/L (ref 134–144)
Total Protein: 6.3 g/dL (ref 6.0–8.5)

## 2020-02-01 LAB — CBC WITH DIFFERENTIAL/PLATELET
Basophils Absolute: 0.1 10*3/uL (ref 0.0–0.2)
Basos: 1 %
EOS (ABSOLUTE): 0.2 10*3/uL (ref 0.0–0.4)
Eos: 3 %
Hematocrit: 48 % (ref 37.5–51.0)
Hemoglobin: 16 g/dL (ref 13.0–17.7)
Immature Grans (Abs): 0 10*3/uL (ref 0.0–0.1)
Immature Granulocytes: 0 %
Lymphocytes Absolute: 2.1 10*3/uL (ref 0.7–3.1)
Lymphs: 36 %
MCH: 28.8 pg (ref 26.6–33.0)
MCHC: 33.3 g/dL (ref 31.5–35.7)
MCV: 87 fL (ref 79–97)
Monocytes Absolute: 0.5 10*3/uL (ref 0.1–0.9)
Monocytes: 8 %
Neutrophils Absolute: 3 10*3/uL (ref 1.4–7.0)
Neutrophils: 52 %
Platelets: 133 10*3/uL — ABNORMAL LOW (ref 150–450)
RBC: 5.55 x10E6/uL (ref 4.14–5.80)
RDW: 12.2 % (ref 11.6–15.4)
WBC: 5.8 10*3/uL (ref 3.4–10.8)

## 2020-02-01 LAB — LIPASE: Lipase: 35 U/L (ref 13–78)

## 2020-02-01 LAB — HEPATIC FUNCTION PANEL: Bilirubin, Direct: 0.11 mg/dL (ref 0.00–0.40)

## 2020-02-05 ENCOUNTER — Other Ambulatory Visit: Payer: Self-pay | Admitting: Family Medicine

## 2020-02-05 DIAGNOSIS — R1084 Generalized abdominal pain: Secondary | ICD-10-CM

## 2020-02-06 ENCOUNTER — Other Ambulatory Visit: Payer: Self-pay | Admitting: Family Medicine

## 2020-02-06 DIAGNOSIS — R1084 Generalized abdominal pain: Secondary | ICD-10-CM

## 2020-02-27 ENCOUNTER — Ambulatory Visit (HOSPITAL_COMMUNITY)
Admission: RE | Admit: 2020-02-27 | Discharge: 2020-02-27 | Disposition: A | Discharge status: Home / Self Care | Payer: No Typology Code available for payment source | Source: Ambulatory Visit | Attending: Family Medicine | Admitting: Family Medicine

## 2020-02-27 ENCOUNTER — Other Ambulatory Visit: Payer: Self-pay

## 2020-02-27 DIAGNOSIS — R1084 Generalized abdominal pain: Secondary | ICD-10-CM | POA: Insufficient documentation

## 2020-02-27 MED ORDER — IOHEXOL 300 MG/ML  SOLN
100.0000 mL | Freq: Once | INTRAMUSCULAR | Status: AC | PRN
  Administered 2020-02-27: 100 mL via INTRAVENOUS

## 2020-04-10 ENCOUNTER — Ambulatory Visit: Payer: No Typology Code available for payment source | Admitting: Family Medicine

## 2020-04-10 ENCOUNTER — Other Ambulatory Visit: Payer: Self-pay

## 2020-04-10 ENCOUNTER — Encounter: Payer: Self-pay | Admitting: Family Medicine

## 2020-04-10 ENCOUNTER — Ambulatory Visit (INDEPENDENT_AMBULATORY_CARE_PROVIDER_SITE_OTHER): Payer: No Typology Code available for payment source

## 2020-04-10 VITALS — BP 105/69 | HR 76 | Temp 97.8°F | Wt 250.2 lb

## 2020-04-10 DIAGNOSIS — K219 Gastro-esophageal reflux disease without esophagitis: Secondary | ICD-10-CM | POA: Diagnosis not present

## 2020-04-10 DIAGNOSIS — M79604 Pain in right leg: Secondary | ICD-10-CM

## 2020-04-10 DIAGNOSIS — E782 Mixed hyperlipidemia: Secondary | ICD-10-CM

## 2020-04-10 DIAGNOSIS — M79671 Pain in right foot: Secondary | ICD-10-CM

## 2020-04-10 DIAGNOSIS — I10 Essential (primary) hypertension: Secondary | ICD-10-CM

## 2020-04-10 DIAGNOSIS — M79672 Pain in left foot: Secondary | ICD-10-CM

## 2020-04-10 MED ORDER — NEBIVOLOL HCL 10 MG PO TABS: 10.0000 mg | ORAL_TABLET | Freq: Every day | ORAL | 3 refills | Status: DC

## 2020-04-10 NOTE — Progress Notes (Signed)
Subjective:  Patient ID: Roy Lawrence, male    DOB: 02-19-1970  Age: 50 y.o. MRN: [id number]  CC: Follow-up   HPI Roy Lawrence presents for  follow-up of hypertension. Patient has no history of headache chest pain or shortness of breath or recent cough. Patient also denies symptoms of TIA such as focal numbness or weakness. GEts lightheaded when standing. Lasts a few seconds. Drinking water 64 oz+ daily.     History  Roy Lawrence has a past medical history of Allergy, Anxiety, Asthma, Depression, Diverticulitis, Diverticulosis, Esophageal stricture, GERD (gastroesophageal reflux disease), Hypertension, Obesity, and Spondylosis.   Roy Lawrence has a past surgical history that includes Shoulder surgery (Right); Finger surgery (Right); and Esophageal dilation.   His family history includes Asthma in his mother; Dementia in his maternal grandfather and maternal grandmother; Diabetes in his father; Diverticulitis in his mother; GER disease in his mother; Berenice Primas' disease in his father; Hodgkin's lymphoma in his father; Hyperlipidemia in his mother; Hypertension in his maternal grandfather, maternal grandmother, and mother; Thyroid disease in his father and sister.Roy Lawrence reports that Roy Lawrence has never smoked. Roy Lawrence has never used smokeless tobacco. Roy Lawrence reports current alcohol use. Roy Lawrence reports that Roy Lawrence does not use drugs.  Current Outpatient Medications on File Prior to Visit  Medication Sig Dispense Refill  . acetaminophen (TYLENOL) 325 MG tablet Take 650 mg by mouth every 6 (six) hours as needed for pain.    Marland Kitchen albuterol (VENTOLIN HFA) 108 (90 Base) MCG/ACT inhaler Inhale 1 puff into the lungs every 6 (six) hours as needed for wheezing. 18 each 3  . b complex vitamins tablet Take 1 tablet by mouth daily.    . budesonide-formoterol (SYMBICORT) 160-4.5 MCG/ACT inhaler INHALE TWO PUFFS BY MOUTH TWICE DAILY, RINSE MOUTH AFTER USING 30.6 g 3  . Cholecalciferol (VITAMIN D3) 5000 UNITS CAPS Take 5,000 Units by mouth daily.     .  diphenhydrAMINE (BENADRYL) 25 mg capsule Take 25 mg by mouth every 6 (six) hours as needed.    Marland Kitchen esomeprazole (NEXIUM 24HR) 20 MG capsule TAKE ONE CAPSULE BY MOUTH TWO TIMES DAILY 180 capsule 3  . fexofenadine (ALLEGRA) 180 MG tablet Take 1 tablet (180 mg total) by mouth daily. For allergy symptoms 30 tablet 11  . Magnesium Oxide (MAG-OX PO) Take 1 tablet by mouth daily.     . Melatonin 10 MG TABS Take 70 mg by mouth.    . ondansetron (ZOFRAN-ODT) 4 MG disintegrating tablet DISSOLVE ONE TABLET UNDER THE TONGUE EVERY 4 HOURS AS NEEDED FOR NAUSEA AND VOMITING 12 tablet 0  . rizatriptan (MAXALT-MLT) 10 MG disintegrating tablet Take 1 tablet (10 mg total) by mouth as needed for migraine. May repeat in 2 hours if needed 10 tablet 11  . Testosterone (AXIRON) 30 MG/ACT SOLN APPLY TWO APPLICATIONS OF SOLUTION TOPICALLY ONCE DAILY TO EACH ARM 180 mL 5  . traZODone (DESYREL) 150 MG tablet TAKE 1/3 TO ONE TABLET BY MOUTH NIGHTLY FOR SLEEP 90 tablet 1   No current facility-administered medications on file prior to visit.    ROS Review of Systems  Constitutional: Negative for fever.  Respiratory: Negative for shortness of breath.   Cardiovascular: Negative for chest pain.  Musculoskeletal: Negative for arthralgias.  Skin: Positive for rash (toenail fungus.R 1st nail. Getting ingrown nails. ).  Neurological:       Feels tazer like sensation in right shin. Occurs 0-6 times aday.  Lasts 30 seconds Onset 2 months ago. Pain is excruciating. Stable frequency.  Objective:  BP 105/69   Pulse 76   Temp 97.8 F (36.6 C)   Wt 250 lb 3.2 oz (113.5 kg)   SpO2 95%   BMI 36.95 kg/m   BP Readings from Last 3 Encounters:  04/10/20 105/69  01/31/20 134/74  10/12/19 134/85    Wt Readings from Last 3 Encounters:  04/10/20 250 lb 3.2 oz (113.5 kg)  01/31/20 268 lb 8 oz (121.8 kg)  10/12/19 261 lb 9.6 oz (118.7 kg)     Physical Exam Vitals reviewed.  Constitutional:      Appearance: Roy Lawrence is  well-developed.  HENT:     Head: Normocephalic and atraumatic.     Right Ear: External ear normal.     Left Ear: External ear normal.     Mouth/Throat:     Pharynx: No oropharyngeal exudate or posterior oropharyngeal erythema.  Eyes:     Pupils: Pupils are equal, round, and reactive to light.  Cardiovascular:     Rate and Rhythm: Normal rate and regular rhythm.     Heart sounds: No murmur heard.   Pulmonary:     Effort: No respiratory distress.     Breath sounds: Normal breath sounds.  Musculoskeletal:     Cervical back: Normal range of motion and neck supple.  Neurological:     Mental Status: Roy Lawrence is alert and oriented to person, place, and time.       Assessment & Plan:   Roy Lawrence was seen today for follow-up.  Diagnoses and all orders for this visit:  Gastroesophageal reflux disease, unspecified whether esophagitis present -     CBC with Differential/Platelet -     CMP14+EGFR -     Lipid panel  Primary hypertension -     CBC with Differential/Platelet -     CMP14+EGFR  Mixed hyperlipidemia -     CMP14+EGFR -     Lipid panel  Acute leg pain, right -     DG Tibia/Fibula Right; Future  Foot pain, bilateral -     Ambulatory referral to Podiatry  Other orders -     nebivolol (BYSTOLIC) 10 MG tablet; Take 1 tablet (10 mg total) by mouth daily. (Needs to be seen before next refill)   Allergies as of 04/10/2020      Reactions   Diovan [valsartan] Swelling   Quinapril Hcl Swelling      Medication List       Accurate as of April 10, 2020 11:59 PM. If you have any questions, ask your nurse or doctor.        acetaminophen 325 MG tablet Commonly known as: TYLENOL Take 650 mg by mouth every 6 (six) hours as needed for pain.   albuterol 108 (90 Base) MCG/ACT inhaler Commonly known as: Ventolin HFA Inhale 1 puff into the lungs every 6 (six) hours as needed for wheezing.   b complex vitamins tablet Take 1 tablet by mouth daily.   budesonide-formoterol 160-4.5  MCG/ACT inhaler Commonly known as: Symbicort INHALE TWO PUFFS BY MOUTH TWICE DAILY, RINSE MOUTH AFTER USING   diphenhydrAMINE 25 mg capsule Commonly known as: BENADRYL Take 25 mg by mouth every 6 (six) hours as needed.   esomeprazole 20 MG capsule Commonly known as: NexIUM 24HR TAKE ONE CAPSULE BY MOUTH TWO TIMES DAILY   fexofenadine 180 MG tablet Commonly known as: ALLEGRA Take 1 tablet (180 mg total) by mouth daily. For allergy symptoms   MAG-OX PO Take 1 tablet by mouth daily.   Melatonin 10  MG Tabs Take 70 mg by mouth.   nebivolol 10 MG tablet Commonly known as: Bystolic Take 1 tablet (10 mg total) by mouth daily. (Needs to be seen before next refill) What changed: when to take this Changed by: Claretta Fraise, MD   ondansetron 4 MG disintegrating tablet Commonly known as: ZOFRAN-ODT DISSOLVE ONE TABLET UNDER THE TONGUE EVERY 4 HOURS AS NEEDED FOR NAUSEA AND VOMITING   rizatriptan 10 MG disintegrating tablet Commonly known as: Maxalt-MLT Take 1 tablet (10 mg total) by mouth as needed for migraine. May repeat in 2 hours if needed   Testosterone 30 MG/ACT Soln Commonly known as: Axiron APPLY TWO APPLICATIONS OF SOLUTION TOPICALLY ONCE DAILY TO EACH ARM   traZODone 150 MG tablet Commonly known as: DESYREL TAKE 1/3 TO ONE TABLET BY MOUTH NIGHTLY FOR SLEEP   Vitamin D3 125 MCG (5000 UT) Caps Take 5,000 Units by mouth daily.       Meds ordered this encounter  Medications  . nebivolol (BYSTOLIC) 10 MG tablet    Sig: Take 1 tablet (10 mg total) by mouth daily. (Needs to be seen before next refill)    Dispense:  90 tablet    Refill:  3    This prescription was filled on 07/19/2019. Any refills authorized will be placed on file.      Follow-up: Return in about 6 months (around 10/11/2020).  Claretta Fraise, M.D.

## 2020-04-11 ENCOUNTER — Other Ambulatory Visit: Payer: No Typology Code available for payment source

## 2020-04-11 LAB — CMP14+EGFR
ALT: 39 IU/L (ref 0–44)
AST: 25 IU/L (ref 0–40)
Albumin/Globulin Ratio: 2.4 — ABNORMAL HIGH (ref 1.2–2.2)
Albumin: 4.5 g/dL (ref 3.8–4.9)
Alkaline Phosphatase: 54 IU/L (ref 44–121)
BUN/Creatinine Ratio: 15 (ref 9–20)
BUN: 18 mg/dL (ref 6–24)
Bilirubin Total: 0.6 mg/dL (ref 0.0–1.2)
CO2: 22 mmol/L (ref 20–29)
Calcium: 9.3 mg/dL (ref 8.7–10.2)
Chloride: 105 mmol/L (ref 96–106)
Creatinine, Ser: 1.24 mg/dL (ref 0.76–1.27)
Globulin, Total: 1.9 g/dL (ref 1.5–4.5)
Glucose: 101 mg/dL — ABNORMAL HIGH (ref 65–99)
Potassium: 4.6 mmol/L (ref 3.5–5.2)
Sodium: 144 mmol/L (ref 134–144)
Total Protein: 6.4 g/dL (ref 6.0–8.5)
eGFR: 70 mL/min/{1.73_m2} (ref >59)

## 2020-04-11 LAB — CBC WITH DIFFERENTIAL/PLATELET
Basophils Absolute: 0.1 10*3/uL (ref 0.0–0.2)
Basos: 1 %
EOS (ABSOLUTE): 0.1 10*3/uL (ref 0.0–0.4)
Eos: 2 %
Hematocrit: 44.1 % (ref 37.5–51.0)
Hemoglobin: 14.8 g/dL (ref 13.0–17.7)
Immature Grans (Abs): 0 10*3/uL (ref 0.0–0.1)
Immature Granulocytes: 0 %
Lymphocytes Absolute: 1.5 10*3/uL (ref 0.7–3.1)
Lymphs: 36 %
MCH: 29.1 pg (ref 26.6–33.0)
MCHC: 33.6 g/dL (ref 31.5–35.7)
MCV: 87 fL (ref 79–97)
Monocytes Absolute: 0.3 10*3/uL (ref 0.1–0.9)
Monocytes: 8 %
Neutrophils Absolute: 2.2 10*3/uL (ref 1.4–7.0)
Neutrophils: 53 %
Platelets: 124 10*3/uL — ABNORMAL LOW (ref 150–450)
RBC: 5.08 x10E6/uL (ref 4.14–5.80)
RDW: 12.5 % (ref 11.6–15.4)
WBC: 4.1 10*3/uL (ref 3.4–10.8)

## 2020-04-11 LAB — LIPID PANEL
Chol/HDL Ratio: 4 ratio (ref 0.0–5.0)
Cholesterol, Total: 135 mg/dL (ref 100–199)
HDL: 34 mg/dL — ABNORMAL LOW (ref >39)
LDL Chol Calc (NIH): 86 mg/dL (ref 0–99)
Triglycerides: 75 mg/dL (ref 0–149)
VLDL Cholesterol Cal: 15 mg/dL (ref 5–40)

## 2020-04-12 ENCOUNTER — Encounter: Payer: Self-pay | Admitting: Family Medicine

## 2020-04-13 NOTE — Progress Notes (Signed)
Hello Trinton,  Your lab result is normal and/or stable.Some minor variations that are not significant are commonly marked abnormal, but do not represent any medical problem for you.  Best regards, Elizardo Chilson, M.D.

## 2020-04-16 ENCOUNTER — Other Ambulatory Visit: Payer: Self-pay

## 2020-04-16 ENCOUNTER — Ambulatory Visit (INDEPENDENT_AMBULATORY_CARE_PROVIDER_SITE_OTHER): Payer: No Typology Code available for payment source | Admitting: Podiatry

## 2020-04-16 ENCOUNTER — Ambulatory Visit (INDEPENDENT_AMBULATORY_CARE_PROVIDER_SITE_OTHER): Payer: No Typology Code available for payment source

## 2020-04-16 DIAGNOSIS — M79671 Pain in right foot: Secondary | ICD-10-CM

## 2020-04-16 DIAGNOSIS — Z79899 Other long term (current) drug therapy: Secondary | ICD-10-CM

## 2020-04-16 DIAGNOSIS — M722 Plantar fascial fibromatosis: Secondary | ICD-10-CM | POA: Diagnosis not present

## 2020-04-16 DIAGNOSIS — L600 Ingrowing nail: Secondary | ICD-10-CM | POA: Diagnosis not present

## 2020-04-16 DIAGNOSIS — B351 Tinea unguium: Secondary | ICD-10-CM | POA: Diagnosis not present

## 2020-04-16 DIAGNOSIS — M79672 Pain in left foot: Secondary | ICD-10-CM

## 2020-04-16 MED ORDER — MELOXICAM 7.5 MG PO TABS: 7.5000 mg | ORAL_TABLET | Freq: Every day | ORAL | 0 refills | Status: DC

## 2020-04-16 MED ORDER — TERBINAFINE HCL 250 MG PO TABS: 250.0000 mg | ORAL_TABLET | Freq: Every day | ORAL | 0 refills | Status: DC

## 2020-04-16 NOTE — Patient Instructions (Signed)
For instructions on how to put on your Plantar Fascial Brace, please visit [email or web address]   Plantar Fasciitis (Heel Spur Syndrome) with Rehab The plantar fascia is a fibrous, ligament-like, soft-tissue structure that spans the bottom of the foot. Plantar fasciitis is a condition that causes pain in the foot due to inflammation of the tissue. SYMPTOMS   Pain and tenderness on the underneath side of the foot.  Pain that worsens with standing or walking. CAUSES  Plantar fasciitis is caused by irritation and injury to the plantar fascia on the underneath side of the foot. Common mechanisms of injury include:  Direct trauma to bottom of the foot.  Damage to a small nerve that runs under the foot where the main fascia attaches to the heel bone.  Stress placed on the plantar fascia due to bone spurs. RISK INCREASES WITH:   Activities that place stress on the plantar fascia (running, jumping, pivoting, or cutting).  Poor strength and flexibility.  Improperly fitted shoes.  Tight calf muscles.  Flat feet.  Failure to warm-up properly before activity.  Obesity. PREVENTION  Warm up and stretch properly before activity.  Allow for adequate recovery between workouts.  Maintain physical fitness:  Strength, flexibility, and endurance.  Cardiovascular fitness.  Maintain a health body weight.  Avoid stress on the plantar fascia.  Wear properly fitted shoes, including arch supports for individuals who have flat feet.  PROGNOSIS  If treated properly, then the symptoms of plantar fasciitis usually resolve without surgery. However, occasionally surgery is necessary.  RELATED COMPLICATIONS   Recurrent symptoms that may result in a chronic condition.  Problems of the lower back that are caused by compensating for the injury, such as limping.  Pain or weakness of the foot during push-off following surgery.  Chronic inflammation, scarring, and partial or complete  fascia tear, occurring more often from repeated injections.  TREATMENT  Treatment initially involves the use of ice and medication to help reduce pain and inflammation. The use of strengthening and stretching exercises may help reduce pain with activity, especially stretches of the Achilles tendon. These exercises may be performed at home or with a therapist. Your caregiver may recommend that you use heel cups of arch supports to help reduce stress on the plantar fascia. Occasionally, corticosteroid injections are given to reduce inflammation. If symptoms persist for greater than 6 months despite non-surgical (conservative), then surgery may be recommended.   MEDICATION   If pain medication is necessary, then nonsteroidal anti-inflammatory medications, such as aspirin and ibuprofen, or other minor pain relievers, such as acetaminophen, are often recommended.  Do not take pain medication within 7 days before surgery.  Prescription pain relievers may be given if deemed necessary by your caregiver. Use only as directed and only as much as you need.  Corticosteroid injections may be given by your caregiver. These injections should be reserved for the most serious cases, because they may only be given a certain number of times.  HEAT AND COLD  Cold treatment (icing) relieves pain and reduces inflammation. Cold treatment should be applied for 10 to 15 minutes every 2 to 3 hours for inflammation and pain and immediately after any activity that aggravates your symptoms. Use ice packs or massage the area with a piece of ice (ice massage).  Heat treatment may be used prior to performing the stretching and strengthening activities prescribed by your caregiver, physical therapist, or athletic trainer. Use a heat pack or soak the injury in warm water.  SEEK IMMEDIATE MEDICAL   CARE IF:  Treatment seems to offer no benefit, or the condition worsens.  Any medications produce adverse side effects.   EXERCISES- RANGE OF MOTION (ROM) AND STRETCHING EXERCISES - Plantar Fasciitis (Heel Spur Syndrome) These exercises may help you when beginning to rehabilitate your injury. Your symptoms may resolve with or without further involvement from your physician, physical therapist or athletic trainer. While completing these exercises, remember:   Restoring tissue flexibility helps normal motion to return to the joints. This allows healthier, less painful movement and activity.  An effective stretch should be held for at least 30 seconds.  A stretch should never be painful. You should only feel a gentle lengthening or release in the stretched tissue.  RANGE OF MOTION - Toe Extension, Flexion  Sit with your right / left leg crossed over your opposite knee.  Grasp your toes and gently pull them back toward the top of your foot. You should feel a stretch on the bottom of your toes and/or foot.  Hold this stretch for 10 seconds.  Now, gently pull your toes toward the bottom of your foot. You should feel a stretch on the top of your toes and or foot.  Hold this stretch for 10 seconds. Repeat  times. Complete this stretch 3 times per day.   RANGE OF MOTION - Ankle Dorsiflexion, Active Assisted  Remove shoes and sit on a chair that is preferably not on a carpeted surface.  Place right / left foot under knee. Extend your opposite leg for support.  Keeping your heel down, slide your right / left foot back toward the chair until you feel a stretch at your ankle or calf. If you do not feel a stretch, slide your bottom forward to the edge of the chair, while still keeping your heel down.  Hold this stretch for 10 seconds. Repeat 3 times. Complete this stretch 2 times per day.   STRETCH  Gastroc, Standing  Place hands on wall.  Extend right / left leg, keeping the front knee somewhat bent.  Slightly point your toes inward on your back foot.  Keeping your right / left heel on the floor and your  knee straight, shift your weight toward the wall, not allowing your back to arch.  You should feel a gentle stretch in the right / left calf. Hold this position for 10 seconds. Repeat 3 times. Complete this stretch 2 times per day.  STRETCH  Soleus, Standing  Place hands on wall.  Extend right / left leg, keeping the other knee somewhat bent.  Slightly point your toes inward on your back foot.  Keep your right / left heel on the floor, bend your back knee, and slightly shift your weight over the back leg so that you feel a gentle stretch deep in your back calf.  Hold this position for 10 seconds. Repeat 3 times. Complete this stretch 2 times per day.  STRETCH  Gastrocsoleus, Standing  Note: This exercise can place a lot of stress on your foot and ankle. Please complete this exercise only if specifically instructed by your caregiver.   Place the ball of your right / left foot on a step, keeping your other foot firmly on the same step.  Hold on to the wall or a rail for balance.  Slowly lift your other foot, allowing your body weight to press your heel down over the edge of the step.  You should feel a stretch in your right / left calf.  Hold this   position for 10 seconds.  Repeat this exercise with a slight bend in your right / left knee. Repeat 3 times. Complete this stretch 2 times per day.   STRENGTHENING EXERCISES - Plantar Fasciitis (Heel Spur Syndrome)  These exercises may help you when beginning to rehabilitate your injury. They may resolve your symptoms with or without further involvement from your physician, physical therapist or athletic trainer. While completing these exercises, remember:   Muscles can gain both the endurance and the strength needed for everyday activities through controlled exercises.  Complete these exercises as instructed by your physician, physical therapist or athletic trainer. Progress the resistance and repetitions only as guided.  STRENGTH -  Towel Curls  Sit in a chair positioned on a non-carpeted surface.  Place your foot on a towel, keeping your heel on the floor.  Pull the towel toward your heel by only curling your toes. Keep your heel on the floor. Repeat 3 times. Complete this exercise 2 times per day.  STRENGTH - Ankle Inversion  Secure one end of a rubber exercise band/tubing to a fixed object (table, pole). Loop the other end around your foot just before your toes.  Place your fists between your knees. This will focus your strengthening at your ankle.  Slowly, pull your big toe up and in, making sure the band/tubing is positioned to resist the entire motion.  Hold this position for 10 seconds.  Have your muscles resist the band/tubing as it slowly pulls your foot back to the starting position. Repeat 3 times. Complete this exercises 2 times per day.  Document Released: 01/19/2005 Document Revised: 04/13/2011 Document Reviewed: 05/03/2008 [employer] Patient Information 2014 [employer]. 

## 2020-04-23 NOTE — Progress Notes (Signed)
Subjective:   Patient ID: Roy Lawrence, male   DOB: 50 y.o.   MRN: [id number]   HPI 50 year old male presents the office with concerns of bilateral foot pain.  He states he stands all day on concrete.  He gets pain in the bottom of his heel.  He gets pain when he first gets up or if he has been sitting for some time and stands back up.  This started back in January.  No recent injury or trauma.  No treatment.  The left and the right about the same.  He has a remote history of ankle fracture.  Also concern about possibly his nails becoming discolored.  At times she will get ingrown toenails.  No redness or drainage or any swelling to the toenail site at this time.  He has no other concerns.   Review of Systems  All other systems reviewed and are negative.  Past Medical History:  Diagnosis Date  . Allergy    SEASONAL  . Anxiety   . Asthma   . Depression   . Diverticulitis   . Diverticulosis   . Esophageal stricture   . GERD (gastroesophageal reflux disease)   . Hypertension    Prehypertension  . Obesity   . Spondylosis     Past Surgical History:  Procedure Laterality Date  . ESOPHAGEAL DILATION    . FINGER SURGERY Right    3rd digit; imbedded glass  . SHOULDER SURGERY Right    x 2     Current Outpatient Medications:  .  meloxicam (MOBIC) 7.5 MG tablet, Take 1 tablet (7.5 mg total) by mouth daily., Disp: 14 tablet, Rfl: 0 .  terbinafine (LAMISIL) 250 MG tablet, Take 1 tablet (250 mg total) by mouth daily., Disp: 30 tablet, Rfl: 0 .  acetaminophen (TYLENOL) 325 MG tablet, Take 650 mg by mouth every 6 (six) hours as needed for pain., Disp: , Rfl:  .  albuterol (VENTOLIN HFA) 108 (90 Base) MCG/ACT inhaler, Inhale 1 puff into the lungs every 6 (six) hours as needed for wheezing., Disp: 18 each, Rfl: 3 .  b complex vitamins tablet, Take 1 tablet by mouth daily., Disp: , Rfl:  .  budesonide-formoterol (SYMBICORT) 160-4.5 MCG/ACT inhaler, INHALE TWO PUFFS BY MOUTH TWICE DAILY,  RINSE MOUTH AFTER USING, Disp: 30.6 g, Rfl: 3 .  Cholecalciferol (VITAMIN D3) 5000 UNITS CAPS, Take 5,000 Units by mouth daily. , Disp: , Rfl:  .  diphenhydrAMINE (BENADRYL) 25 mg capsule, Take 25 mg by mouth every 6 (six) hours as needed., Disp: , Rfl:  .  esomeprazole (NEXIUM 24HR) 20 MG capsule, TAKE ONE CAPSULE BY MOUTH TWO TIMES DAILY, Disp: 180 capsule, Rfl: 3 .  fexofenadine (ALLEGRA) 180 MG tablet, Take 1 tablet (180 mg total) by mouth daily. For allergy symptoms, Disp: 30 tablet, Rfl: 11 .  Magnesium Oxide (MAG-OX PO), Take 1 tablet by mouth daily. , Disp: , Rfl:  .  Melatonin 10 MG TABS, Take 70 mg by mouth., Disp: , Rfl:  .  nebivolol (BYSTOLIC) 10 MG tablet, Take 1 tablet (10 mg total) by mouth daily. (Needs to be seen before next refill), Disp: 90 tablet, Rfl: 3 .  ondansetron (ZOFRAN-ODT) 4 MG disintegrating tablet, DISSOLVE ONE TABLET UNDER THE TONGUE EVERY 4 HOURS AS NEEDED FOR NAUSEA AND VOMITING, Disp: 12 tablet, Rfl: 0 .  rizatriptan (MAXALT-MLT) 10 MG disintegrating tablet, Take 1 tablet (10 mg total) by mouth as needed for migraine. May repeat in 2 hours if needed,  Disp: 10 tablet, Rfl: 11 .  Testosterone (AXIRON) 30 MG/ACT SOLN, APPLY TWO APPLICATIONS OF SOLUTION TOPICALLY ONCE DAILY TO EACH ARM, Disp: 180 mL, Rfl: 5 .  traZODone (DESYREL) 150 MG tablet, TAKE 1/3 TO ONE TABLET BY MOUTH NIGHTLY FOR SLEEP, Disp: 90 tablet, Rfl: 1  Allergies  Allergen Reactions  . Diovan [Valsartan] Swelling  . Quinapril Hcl Swelling          Objective:  Physical Exam  General: AAO x3, NAD  Dermatological: Nails are mildly hypertrophic, dystrophic with yellow-brown discoloration.  Mild incurvation present to the toenails on the hallux causing discomfort.  No edema, erythema, drainage or pus or any signs of infection.  There is no open lesions.  Vascular: Dorsalis Pedis artery and Posterior Tibial artery pedal pulses are 2/4 bilateral with immedate capillary fill time.  There is no pain  with calf compression, swelling, warmth, erythema.   Neruologic: Grossly intact via light touch bilateral. Negative tinel sign.  Musculoskeletal: Tenderness to palpation along the plantar medial tubercle of the calcaneus at the insertion of plantar fascia on the left and right foot. There is no pain along the course of the plantar fascia within the arch of the foot. Plantar fascia appears to be intact. There is no pain with lateral compression of the calcaneus or pain with vibratory sensation. There is no pain along the course or insertion of the achilles tendon. No other areas of tenderness to bilateral lower extremities. Muscular strength 5/5 in all groups tested bilateral.  Gait: Unassisted, Nonantalgic.       Assessment:   Bilateral heel pain, plantar fasciitis; onychomycosis; ingrown toenail    Plan:  -Treatment options discussed including all alternatives, risks, and complications -Etiology of symptoms were discussed  1.  Heel pain, plantar fasciitis -X-rays obtained and reviewed.  No evidence of acute fracture. -Discussed steroid injection -Prescribed mobic. Discussed side effects of the medication and directed to stop if any are to occur and call the office.  -Plantar fascial brace -Stretching/icing daily -Shoe modifications and orthotics  2.  Onychomycosis -Discussed treatment options both orally, topical as well as alternative treatments.  Elected presents oral Lamisil.  Previously had blood work which is normal.  We will start Lamisil.  Recheck CBC and LFT in about 1 month.  Discussed he previously had elevated liver enzymes but he states that he has been doing much better with his diet which is been helpful.  3.  Ingrown toenail -Discussed conservative and surgical options.  Likely AP nail next appointment.  Trula Slade DPM

## 2020-05-14 ENCOUNTER — Other Ambulatory Visit: Payer: Self-pay | Admitting: Family Medicine

## 2020-05-14 NOTE — Telephone Encounter (Signed)
Last office visit 04/10/20 Last lab check 10/18/19 Last refill 9/9/212, 180 ml, 5 refills

## 2020-05-20 ENCOUNTER — Ambulatory Visit: Payer: No Typology Code available for payment source | Admitting: Podiatry

## 2020-05-25 ENCOUNTER — Other Ambulatory Visit: Payer: Self-pay

## 2020-05-25 ENCOUNTER — Ambulatory Visit (INDEPENDENT_AMBULATORY_CARE_PROVIDER_SITE_OTHER): Payer: No Typology Code available for payment source | Admitting: Podiatry

## 2020-05-25 ENCOUNTER — Encounter: Payer: Self-pay | Admitting: Podiatry

## 2020-05-25 DIAGNOSIS — M722 Plantar fascial fibromatosis: Secondary | ICD-10-CM

## 2020-05-25 DIAGNOSIS — M79672 Pain in left foot: Secondary | ICD-10-CM

## 2020-05-25 DIAGNOSIS — M79671 Pain in right foot: Secondary | ICD-10-CM

## 2020-05-25 DIAGNOSIS — L600 Ingrowing nail: Secondary | ICD-10-CM

## 2020-05-25 DIAGNOSIS — B351 Tinea unguium: Secondary | ICD-10-CM

## 2020-05-25 DIAGNOSIS — Z79899 Other long term (current) drug therapy: Secondary | ICD-10-CM

## 2020-05-25 MED ORDER — TERBINAFINE HCL 250 MG PO TABS: 250.0000 mg | ORAL_TABLET | Freq: Every day | ORAL | 0 refills | Status: DC

## 2020-05-25 MED ORDER — MELOXICAM 7.5 MG PO TABS: 7.5000 mg | ORAL_TABLET | Freq: Every day | ORAL | 0 refills | Status: DC

## 2020-05-25 NOTE — Patient Instructions (Signed)

## 2020-05-25 NOTE — Progress Notes (Signed)
Subjective: 50 year old male presents the office today for preparation of bilateral heel pain, plantar fasciitis.  He states that taking anti-inflammatories is been helpful has been stretching more and the pain is improved.  He denies any recent injury or trauma.    He is also been on Lamisil without any side effects.  He has finished his 30-day course need to recheck blood work.  Also presents today for ingrown toenails with the left side worse than right.  He states on both corners of the left big toenail he gets discomfort.  Currently denies of any drainage or any signs of infection.  Denies any systemic complaints such as fevers, chills, nausea, vomiting. No acute changes since last appointment, and no other complaints at this time.   Objective: AAO x3, NAD DP/PT pulses palpable bilaterally, CRT less than 3 seconds On today's exam there is no significant area of pinpoint tenderness there is no edema, erythema.  There is no pain with lateral compression of calcaneus bilaterally.  There is no pain on the plantar medial tubercle of the calcaneus.  Achilles tendon appears to be intact.  Along the right hallux toenail there is some clearing on the proximal nail fold but the rest the nails yellow discoloration and thickening.  No pain to the nail on the right side.  Incurvation present in the left hallux toenail both medial lateral aspect with tenderness.  No edema, erythema. No pain with calf compression, swelling, warmth, erythema  Assessment: Bilateral plantar fasciitis with improvement, ingrown toenails, onychomycosis  Plan: -All treatment options discussed with the patient including all alternatives, risks, complications.   1.  Plantar fasciitis -Refill meloxicam.  Continue stretching, icing daily.  Discussed shoe modifications and good arch supports.  2.  Onychomycosis -Refilled Lamisil but recheck CBC and LFT.  Orders placed.  3.  Ingrown toenail left hallux medial, lateral nail  border -At this time, the patient is requesting partial nail removal with chemical matricectomy to the symptomatic portion of the nail. Risks and complications were discussed with the patient for which they understand and written consent was obtained. Under sterile conditions a total of 3 mL of a mixture of 2% lidocaine plain and 0.5% Marcaine plain was infiltrated in a hallux block fashion. Once anesthetized, the skin was prepped in sterile fashion. A tourniquet was then applied. Next the medial and lateral aspect of hallux nail border was then sharply excised making sure to remove the entire offending nail border. Once the nails were ensured to be removed area was debrided and the underlying skin was intact. There is no purulence identified in the procedure. Next phenol was then applied under standard conditions and copiously irrigated. Silvadene was applied. A dry sterile dressing was applied. After application of the dressing the tourniquet was removed and there is found to be an immediate capillary refill time to the digit. The patient tolerated the procedure well any complications. Post procedure instructions were discussed the patient for which he verbally understood. Follow-up in one week for nail check or sooner if any problems are to arise. Discussed signs/symptoms of infection and directed to call the office immediately should any occur or go directly to the emergency room. In the meantime, encouraged to call the office with any questions, concerns, changes symptoms.  Trula Slade DPM

## 2020-06-03 ENCOUNTER — Other Ambulatory Visit: Payer: Self-pay | Admitting: Family Medicine

## 2020-06-10 ENCOUNTER — Other Ambulatory Visit: Payer: Self-pay

## 2020-06-10 ENCOUNTER — Ambulatory Visit (INDEPENDENT_AMBULATORY_CARE_PROVIDER_SITE_OTHER): Payer: No Typology Code available for payment source | Admitting: Podiatry

## 2020-06-10 ENCOUNTER — Encounter: Payer: Self-pay | Admitting: Podiatry

## 2020-06-10 DIAGNOSIS — B351 Tinea unguium: Secondary | ICD-10-CM | POA: Diagnosis not present

## 2020-06-10 DIAGNOSIS — Z79899 Other long term (current) drug therapy: Secondary | ICD-10-CM | POA: Diagnosis not present

## 2020-06-10 DIAGNOSIS — L600 Ingrowing nail: Secondary | ICD-10-CM

## 2020-06-10 NOTE — Patient Instructions (Signed)
Terbinafine oral granules What is this medicine? TERBINAFINE (TER bin a feen) is an antifungal medicine. It is used to treat certain kinds of fungal or yeast infections. This medicine may be used for other purposes; ask your health care provider or pharmacist if you have questions. COMMON BRAND NAME(S): Lamisil What should I tell my health care provider before I take this medicine? They need to know if you have any of these conditions:  drink alcoholic beverages  kidney disease  liver disease  an unusual or allergic reaction to Terbinafine, other medicines, foods, dyes, or preservatives  pregnant or trying to get pregnant  breast-feeding How should I use this medicine? Take this medicine by mouth. Follow the directions on the prescription label. Hold packet with cut line on top. Shake packet gently to settle contents. Tear packet open along cut line, or use scissors to cut across line. Carefully pour the entire contents of packet onto a spoonful of a soft food, such as pudding or other soft, non-acidic food such as mashed potatoes (do NOT use applesauce or a fruit-based food). If two packets are required for each dose, you may either sprinkle the content of both packets on one spoonful of non-acidic food, or sprinkle the contents of both packets on two spoonfuls of non-acidic food. Make sure that no granules remain in the packet. Swallow the mxiture of the food and granules without chewing. Take your medicine at regular intervals. Do not take it more often than directed. Take all of your medicine as directed even if you think you are better. Do not skip doses or stop your medicine early. Contact your pediatrician or health care professional regarding the use of this medicine in children. While this medicine may be prescribed for children as young as 4 years for selected conditions, precautions do apply. Overdosage: If you think you have taken too much of this medicine contact a poison control  center or emergency room at once. NOTE: This medicine is only for you. Do not share this medicine with others. What if I miss a dose? If you miss a dose, take it as soon as you can. If it is almost time for your next dose, take only that dose. Do not take double or extra doses. What may interact with this medicine? Do not take this medicine with any of the following medications:  thioridazine This medicine may also interact with the following medications:  beta-blockers  caffeine  cimetidine  cyclosporine  MAOIs like Carbex, Eldepryl, Marplan, Nardil, and Parnate  medicines for fungal infections like fluconazole and ketoconazole  medicines for irregular heartbeat like amiodarone, flecainide and propafenone  rifampin  SSRIs like citalopram, escitalopram, fluoxetine, fluvoxamine, paroxetine and sertraline  tricyclic antidepressants like amitriptyline, clomipramine, desipramine, imipramine, nortriptyline, and others  warfarin This list may not describe all possible interactions. Give your health care provider a list of all the medicines, herbs, non-prescription drugs, or dietary supplements you use. Also tell them if you smoke, drink alcohol, or use illegal drugs. Some items may interact with your medicine. What should I watch for while using this medicine? Your doctor may monitor your liver function. Tell your doctor right away if you have nausea or vomiting, loss of appetite, stomach pain on your right upper side, yellow skin, dark urine, light stools, or are over tired. This medicine may cause serious skin reactions. They can happen weeks to months after starting the medicine. Contact your health care provider right away if you notice fevers or flu-like symptoms   with a rash. The rash may be red or purple and then turn into blisters or peeling of the skin. Or, you might notice a red rash with swelling of the face, lips or lymph nodes in your neck or under your arms. You need to take  this medicine for 6 weeks or longer to cure the fungal infection. Take your medicine regularly for as long as your doctor or health care provider tells you to. What side effects may I notice from receiving this medicine? Side effects that you should report to your doctor or health care professional as soon as possible:  allergic reactions like skin rash or hives, swelling of the face, lips, or tongue  change in vision  dark urine  fever or infection  general ill feeling or flu-like symptoms  light-colored stools  loss of appetite, nausea  rash, fever, and swollen lymph nodes  redness, blistering, peeling or loosening of the skin, including inside the mouth  right upper belly pain  unusually weak or tired  yellowing of the eyes or skin Side effects that usually do not require medical attention (report to your doctor or health care professional if they continue or are bothersome):  changes in taste  diarrhea  hair loss  muscle or joint pain  stomach upset This list may not describe all possible side effects. Call your doctor for medical advice about side effects. You may report side effects to FDA at 1-800-FDA-1088. Where should I keep my medicine? Keep out of the reach of children. Store at room temperature between 15 and 30 degrees C (59 and 86 degrees F). Throw away any unused medicine after the expiration date. NOTE: This sheet is a summary. It may not cover all possible information. If you have questions about this medicine, talk to your doctor, pharmacist, or health care provider.  2021 Elsevier/Gold Standard (2018-04-29 15:35:11)  

## 2020-06-11 LAB — HEPATIC FUNCTION PANEL
ALT: 22 IU/L (ref 0–44)
AST: 19 IU/L (ref 0–40)
Albumin: 4.8 g/dL (ref 3.8–4.9)
Alkaline Phosphatase: 62 IU/L (ref 44–121)
Bilirubin Total: <0.2 mg/dL (ref 0.0–1.2)
Bilirubin, Direct: <0.1 mg/dL (ref 0.00–0.40)
Total Protein: 6.2 g/dL (ref 6.0–8.5)

## 2020-06-11 LAB — CBC WITH DIFFERENTIAL/PLATELET
Basophils Absolute: 0.1 10*3/uL (ref 0.0–0.2)
Basos: 1 %
EOS (ABSOLUTE): 0.2 10*3/uL (ref 0.0–0.4)
Eos: 3 %
Hematocrit: 43.7 % (ref 37.5–51.0)
Hemoglobin: 15.1 g/dL (ref 13.0–17.7)
Immature Grans (Abs): 0 10*3/uL (ref 0.0–0.1)
Immature Granulocytes: 0 %
Lymphocytes Absolute: 1.9 10*3/uL (ref 0.7–3.1)
Lymphs: 34 %
MCH: 29.9 pg (ref 26.6–33.0)
MCHC: 34.6 g/dL (ref 31.5–35.7)
MCV: 87 fL (ref 79–97)
Monocytes Absolute: 0.5 10*3/uL (ref 0.1–0.9)
Monocytes: 8 %
Neutrophils Absolute: 3 10*3/uL (ref 1.4–7.0)
Neutrophils: 54 %
Platelets: 108 10*3/uL — ABNORMAL LOW (ref 150–450)
RBC: 5.05 x10E6/uL (ref 4.14–5.80)
RDW: 12.2 % (ref 11.6–15.4)
WBC: 5.6 10*3/uL (ref 3.4–10.8)

## 2020-06-13 ENCOUNTER — Other Ambulatory Visit: Payer: Self-pay | Admitting: Podiatry

## 2020-06-13 DIAGNOSIS — Z79899 Other long term (current) drug therapy: Secondary | ICD-10-CM

## 2020-06-13 MED ORDER — TERBINAFINE HCL 250 MG PO TABS: 250.0000 mg | ORAL_TABLET | Freq: Every day | ORAL | 0 refills | Status: DC

## 2020-06-13 NOTE — Progress Notes (Signed)
CBC and LFT ordered to be rechecked in one month.

## 2020-06-17 NOTE — Progress Notes (Signed)
Subjective: 50 year old male presents the office today for follow-up evaluation of ingrown toenail as well as for nail fungus.  He states the procedure sites been healing well.  A little bit of redness and purple discoloration for couple days afterwards but this is resolved.  No pain denies any redness or drainage or any signs of infection.  He is currently on Lamisil tolerating well with any side effects.  No significant pain to the heels and plan fasciitis appears to be doing well. Denies any systemic complaints such as fevers, chills, nausea, vomiting. No acute changes since last appointment, and no other complaints at this time.   Objective: AAO x3, NAD DP/PT pulses palpable bilaterally, CRT less than 3 seconds There is slight clear on proximal nail fold there is overall minimal improvement.  There is no edema, erythema and signs of infection.  The procedure site on the left hallux appears to be healed with a small scab present there is no drainage or pus or any signs of infection.  No open lesions. No pain with calf compression, swelling, warmth, erythema  Assessment: Onychomycosis, currently on Lamisil; status post partial nail avulsion  Plan: -All treatment options discussed with the patient including all alternatives, risks, complications.  -Continue to wash the procedure site with soap and water daily and dry thoroughly and apply antibiotic ointment and a bandage.  Monitor for any signs or symptoms of infection or reoccurrence of ingrown toenail. -Recheck CBC and LFT.  Continue Lamisil but he is tolerating well but continue to monitor for any side effects. -Patient encouraged to call the office with any questions, concerns, change in symptoms.   Trula Slade DPM

## 2020-08-21 NOTE — Telephone Encounter (Signed)
Encounter closed

## 2020-10-10 ENCOUNTER — Other Ambulatory Visit: Payer: Self-pay

## 2020-10-10 ENCOUNTER — Encounter: Payer: Self-pay | Admitting: Family Medicine

## 2020-10-10 ENCOUNTER — Ambulatory Visit: Payer: No Typology Code available for payment source | Admitting: Family Medicine

## 2020-10-10 VITALS — BP 97/57 | HR 66 | Temp 97.9°F | Ht 69.0 in | Wt 233.8 lb

## 2020-10-10 DIAGNOSIS — M722 Plantar fascial fibromatosis: Secondary | ICD-10-CM

## 2020-10-10 DIAGNOSIS — I10 Essential (primary) hypertension: Secondary | ICD-10-CM

## 2020-10-10 DIAGNOSIS — E349 Endocrine disorder, unspecified: Secondary | ICD-10-CM | POA: Diagnosis not present

## 2020-10-10 DIAGNOSIS — N400 Enlarged prostate without lower urinary tract symptoms: Secondary | ICD-10-CM | POA: Diagnosis not present

## 2020-10-10 DIAGNOSIS — E782 Mixed hyperlipidemia: Secondary | ICD-10-CM

## 2020-10-10 LAB — URINALYSIS
Bilirubin, UA: NEGATIVE
Glucose, UA: NEGATIVE
Leukocytes,UA: NEGATIVE
Nitrite, UA: NEGATIVE
Protein,UA: NEGATIVE
RBC, UA: NEGATIVE
Specific Gravity, UA: 1.025 (ref 1.005–1.030)
Urobilinogen, Ur: 0.2 mg/dL (ref 0.2–1.0)
pH, UA: 6.5 (ref 5.0–7.5)

## 2020-10-10 MED ORDER — BUDESONIDE-FORMOTEROL FUMARATE 160-4.5 MCG/ACT IN AERO: INHALATION_SPRAY | RESPIRATORY_TRACT | 3 refills | Status: DC

## 2020-10-10 MED ORDER — FEXOFENADINE HCL 180 MG PO TABS: 180.0000 mg | ORAL_TABLET | Freq: Every day | ORAL | 11 refills | Status: DC

## 2020-10-10 MED ORDER — TRAZODONE HCL 150 MG PO TABS: ORAL_TABLET | ORAL | 3 refills | Status: DC

## 2020-10-10 MED ORDER — ALBUTEROL SULFATE HFA 108 (90 BASE) MCG/ACT IN AERS: 1.0000 | INHALATION_SPRAY | Freq: Four times a day (QID) | RESPIRATORY_TRACT | 3 refills | Status: DC | PRN

## 2020-10-10 MED ORDER — ESOMEPRAZOLE MAGNESIUM 20 MG PO CPDR: DELAYED_RELEASE_CAPSULE | ORAL | 3 refills | Status: DC

## 2020-10-10 NOTE — Progress Notes (Signed)
Subjective:  Patient ID: Roy Lawrence, male    DOB: 08-Oct-1970  Age: 50 y.o. MRN: [id number]  CC: Medical Management of Chronic Issues   HPI Roy Lawrence presents for Patient in for follow-up of GERD. Currently asymptomatic taking OTC Nexium twice daily. There is no chest pain or heartburn. No hematemesis and no melena. No dysphagia or choking. Onset is remote. Progression is stable. Complicating factors, none.  New job as Presenter, broadcasting at [employer]. Skipping breakfast. Fighting with plantar fascitis. Using Custom inserts and braces.    presents for  follow-up of hypertension. Patient has no history of headache chest pain or shortness of breath or recent cough. Patient also denies symptoms of TIA such as focal numbness or weakness. Patient denies side effects from medication. States taking it regularly. Bp at home [id number] systolic via the smart watch.  Also in today for check on his cholesterol.   Follow up for testosterone deficiency: Pt. Using medication as directed. Denies any sx referrable to DVT such as edema or erythema of legs. No dyspnea or chest pain. Energy level reported as being good. Libido is normal and denies E.D. Feels strength is adequate and improved from baseline.   Depression screen [place] 2/9 10/10/2020 10/10/2020 04/10/2020  Decreased Interest 0 0 0  Down, Depressed, Hopeless 0 0 0  PHQ - 2 Score 0 0 0  Altered sleeping 2 - -  Tired, decreased energy 1 - -  Change in appetite 0 - -  Feeling bad or failure about yourself  0 - -  Trouble concentrating 0 - -  Moving slowly or fidgety/restless 0 - -  Suicidal thoughts 0 - -  PHQ-9 Score 3 - -  Difficult doing work/chores Not difficult at all - -  Some encounter information is confidential and restricted. Go to Review Flowsheets activity to see all data.    History Roy Lawrence has a past medical history of Allergy, Anxiety, Asthma, Depression, Diverticulitis, Diverticulosis, Esophageal stricture, GERD (gastroesophageal reflux  disease), Hypertension, Obesity, and Spondylosis.   He has a past surgical history that includes Shoulder surgery (Right); Finger surgery (Right); and Esophageal dilation.   His family history includes Asthma in his mother; Dementia in his maternal grandfather and maternal grandmother; Diabetes in his father; Diverticulitis in his mother; GER disease in his mother; Berenice Primas' disease in his father; Hodgkin's lymphoma in his father; Hyperlipidemia in his mother; Hypertension in his maternal grandfather, maternal grandmother, and mother; Thyroid disease in his father and sister.He reports that he has never smoked. He has never used smokeless tobacco. He reports current alcohol use. He reports that he does not use drugs.    ROS Review of Systems  Constitutional:  Negative for fever.  Respiratory:  Negative for shortness of breath.   Cardiovascular:  Negative for chest pain.  Musculoskeletal:  Negative for arthralgias.  Skin:  Negative for rash.   Objective:  BP (!) 97/57   Pulse 66   Temp 97.9 F (36.6 C)   Ht 5' 9"  (1.753 m)   Wt 233 lb 12.8 oz (106.1 kg)   SpO2 95%   BMI 34.53 kg/m   BP Readings from Last 3 Encounters:  10/10/20 (!) 97/57  04/10/20 105/69  01/31/20 134/74    Wt Readings from Last 3 Encounters:  10/10/20 233 lb 12.8 oz (106.1 kg)  04/10/20 250 lb 3.2 oz (113.5 kg)  01/31/20 268 lb 8 oz (121.8 kg)     Physical Exam Constitutional:  General: He is not in acute distress.    Appearance: He is well-developed.  HENT:     Head: Normocephalic and atraumatic.     Right Ear: External ear normal.     Left Ear: External ear normal.     Nose: Nose normal.  Eyes:     Conjunctiva/sclera: Conjunctivae normal.     Pupils: Pupils are equal, round, and reactive to light.  Cardiovascular:     Rate and Rhythm: Normal rate and regular rhythm.     Heart sounds: Normal heart sounds. No murmur heard. Pulmonary:     Effort: Pulmonary effort is normal. No respiratory  distress.     Breath sounds: Normal breath sounds. No wheezing or rales.  Abdominal:     Palpations: Abdomen is soft.     Tenderness: There is no abdominal tenderness.  Musculoskeletal:        General: Normal range of motion.     Cervical back: Normal range of motion and neck supple.  Skin:    General: Skin is warm and dry.  Neurological:     Mental Status: He is alert and oriented to person, place, and time.     Deep Tendon Reflexes: Reflexes are normal and symmetric.  Psychiatric:        Behavior: Behavior normal.        Thought Content: Thought content normal.        Judgment: Judgment normal.      Assessment & Plan:   Roy Lawrence was seen today for medical management of chronic issues.  Diagnoses and all orders for this visit:  Primary hypertension -     CBC with Differential/Platelet -     CMP14+EGFR -     Urinalysis  Mixed hyperlipidemia -     Lipid panel -     Urinalysis  Benign prostatic hyperplasia without lower urinary tract symptoms -     Testosterone,Free and Total -     PSA, total and free -     Urinalysis  Testosterone deficiency -     Testosterone,Free and Total -     PSA, total and free -     Urinalysis  Plantar fasciitis of left foot  Other orders -     albuterol (VENTOLIN HFA) 108 (90 Base) MCG/ACT inhaler; Inhale 1 puff into the lungs every 6 (six) hours as needed for wheezing. -     budesonide-formoterol (SYMBICORT) 160-4.5 MCG/ACT inhaler; INHALE TWO PUFFS BY MOUTH TWICE DAILY, RINSE MOUTH AFTER USING -     esomeprazole (NEXIUM 24HR) 20 MG capsule; TAKE ONE CAPSULE BY MOUTH TWO TIMES DAILY -     fexofenadine (ALLEGRA) 180 MG tablet; Take 1 tablet (180 mg total) by mouth daily. For allergy symptoms -     traZODone (DESYREL) 150 MG tablet; TAKE 1/3 TO ONE TABLET BY MOUTH NIGHTLY FOR SLEEP      I have discontinued Roy Lawrence. Roy Lawrence's ondansetron, meloxicam, and terbinafine. I am also having him maintain his Magnesium Oxide (MAG-OX PO), Vitamin D3,  acetaminophen, b complex vitamins, diphenhydrAMINE, rizatriptan, Melatonin, nebivolol, Testosterone, albuterol, budesonide-formoterol, esomeprazole, fexofenadine, and traZODone.  Allergies as of 10/10/2020       Reactions   Diovan [valsartan] Swelling   Quinapril Hcl Swelling   Quinapril Hcl         Medication List        Accurate as of October 10, 2020 11:51 AM. If you have any questions, ask your nurse or doctor.  STOP taking these medications    meloxicam 7.5 MG tablet Commonly known as: Mobic Stopped by: Claretta Fraise, MD   ondansetron 4 MG disintegrating tablet Commonly known as: ZOFRAN-ODT Stopped by: Claretta Fraise, MD   terbinafine 250 MG tablet Commonly known as: LamISIL Stopped by: Claretta Fraise, MD       TAKE these medications    acetaminophen 325 MG tablet Commonly known as: TYLENOL Take 650 mg by mouth every 6 (six) hours as needed for pain.   albuterol 108 (90 Base) MCG/ACT inhaler Commonly known as: Ventolin HFA Inhale 1 puff into the lungs every 6 (six) hours as needed for wheezing.   b complex vitamins tablet Take 1 tablet by mouth daily.   budesonide-formoterol 160-4.5 MCG/ACT inhaler Commonly known as: Symbicort INHALE TWO PUFFS BY MOUTH TWICE DAILY, RINSE MOUTH AFTER USING   diphenhydrAMINE 25 mg capsule Commonly known as: BENADRYL Take 25 mg by mouth every 6 (six) hours as needed.   esomeprazole 20 MG capsule Commonly known as: NexIUM 24HR TAKE ONE CAPSULE BY MOUTH TWO TIMES DAILY   fexofenadine 180 MG tablet Commonly known as: ALLEGRA Take 1 tablet (180 mg total) by mouth daily. For allergy symptoms   MAG-OX PO Take 1 tablet by mouth daily.   Melatonin 10 MG Tabs Take 70 mg by mouth.   nebivolol 10 MG tablet Commonly known as: Bystolic Take 1 tablet (10 mg total) by mouth daily. (Needs to be seen before next refill)   rizatriptan 10 MG disintegrating tablet Commonly known as: Maxalt-MLT Take 1 tablet (10 mg  total) by mouth as needed for migraine. May repeat in 2 hours if needed   Testosterone 30 MG/ACT Soln APPPLY TWO PUMPS TO EACH TO THE ARM ONCE DAILY   traZODone 150 MG tablet Commonly known as: DESYREL TAKE 1/3 TO ONE TABLET BY MOUTH NIGHTLY FOR SLEEP   Vitamin D3 125 MCG (5000 UT) Caps Take 5,000 Units by mouth daily.         Follow-up: Return in about 3 months (around 01/09/2021) for [facility] physical.  Claretta Fraise, M.D.

## 2020-10-12 LAB — CMP14+EGFR
ALT: 15 IU/L (ref 0–44)
AST: 13 IU/L (ref 0–40)
Albumin/Globulin Ratio: 2.8 — ABNORMAL HIGH (ref 1.2–2.2)
Albumin: 4.2 g/dL (ref 3.8–4.9)
Alkaline Phosphatase: 51 IU/L (ref 44–121)
BUN/Creatinine Ratio: 12 (ref 9–20)
BUN: 16 mg/dL (ref 6–24)
Bilirubin Total: 0.4 mg/dL (ref 0.0–1.2)
CO2: 27 mmol/L (ref 20–29)
Calcium: 9.3 mg/dL (ref 8.7–10.2)
Chloride: 102 mmol/L (ref 96–106)
Creatinine, Ser: 1.29 mg/dL — ABNORMAL HIGH (ref 0.76–1.27)
Globulin, Total: 1.5 g/dL (ref 1.5–4.5)
Glucose: 99 mg/dL (ref 65–99)
Potassium: 4.5 mmol/L (ref 3.5–5.2)
Sodium: 142 mmol/L (ref 134–144)
Total Protein: 5.7 g/dL — ABNORMAL LOW (ref 6.0–8.5)
eGFR: 67 mL/min/{1.73_m2} (ref >59)

## 2020-10-12 LAB — CBC WITH DIFFERENTIAL/PLATELET
Basophils Absolute: 0 10*3/uL (ref 0.0–0.2)
Basos: 1 %
EOS (ABSOLUTE): 0.1 10*3/uL (ref 0.0–0.4)
Eos: 2 %
Hematocrit: 43 % (ref 37.5–51.0)
Hemoglobin: 14.5 g/dL (ref 13.0–17.7)
Immature Grans (Abs): 0 10*3/uL (ref 0.0–0.1)
Immature Granulocytes: 0 %
Lymphocytes Absolute: 1.7 10*3/uL (ref 0.7–3.1)
Lymphs: 38 %
MCH: 29.2 pg (ref 26.6–33.0)
MCHC: 33.7 g/dL (ref 31.5–35.7)
MCV: 87 fL (ref 79–97)
Monocytes Absolute: 0.3 10*3/uL (ref 0.1–0.9)
Monocytes: 8 %
Neutrophils Absolute: 2.2 10*3/uL (ref 1.4–7.0)
Neutrophils: 51 %
Platelets: 116 10*3/uL — ABNORMAL LOW (ref 150–450)
RBC: 4.97 x10E6/uL (ref 4.14–5.80)
RDW: 12.7 % (ref 11.6–15.4)
WBC: 4.4 10*3/uL (ref 3.4–10.8)

## 2020-10-12 LAB — LIPID PANEL
Chol/HDL Ratio: 4.6 ratio (ref 0.0–5.0)
Cholesterol, Total: 157 mg/dL (ref 100–199)
HDL: 34 mg/dL — ABNORMAL LOW (ref >39)
LDL Chol Calc (NIH): 95 mg/dL (ref 0–99)
Triglycerides: 157 mg/dL — ABNORMAL HIGH (ref 0–149)
VLDL Cholesterol Cal: 28 mg/dL (ref 5–40)

## 2020-10-12 LAB — PSA, TOTAL AND FREE
PSA, Free Pct: 26.9 %
PSA, Free: 0.35 ng/mL
Prostate Specific Ag, Serum: 1.3 ng/mL (ref 0.0–4.0)

## 2020-10-12 LAB — TESTOSTERONE,FREE AND TOTAL
Testosterone, Free: 15.6 pg/mL (ref 7.2–24.0)
Testosterone: 691 ng/dL (ref 264–916)

## 2020-10-14 ENCOUNTER — Telehealth: Payer: Self-pay | Admitting: *Deleted

## 2020-10-14 NOTE — Telephone Encounter (Signed)
Budesonide-Formoterol Fumarate 160-4.5MCG/ACT aerosol REJECTED by plan   What is covered is the following:  Advair Diskus             Not Required Advair HFA             Not Required Adair Patter             Not Required Judithann Sauger Aerosphere Not Required Budesonide             Not Required Combivent Respimat Not Required Flovent [facility]             Not Required Formoterol Fumarate Not Required Ipratropium-Albuterol Not Required Pulmicort Flexhaler Not Required Pulmicort Flexhaler Not Required Qvar RediHaler Not Required Qvar RediHaler Not Required Trelegy Ellipta             Not Required

## 2020-10-15 ENCOUNTER — Other Ambulatory Visit: Payer: Self-pay | Admitting: Family Medicine

## 2020-10-15 MED ORDER — BREO ELLIPTA 100-25 MCG/INH IN AEPB: 1.0000 | INHALATION_SPRAY | Freq: Every day | RESPIRATORY_TRACT | 11 refills | Status: DC

## 2020-10-15 NOTE — Progress Notes (Signed)
Hello Roy Lawrence,  Your lab result is normal and/or stable.Some minor variations that are not significant are commonly marked abnormal, but do not represent any medical problem for you.  Best regards, Nakeeta Sebastiani, M.D.

## 2020-10-15 NOTE — Telephone Encounter (Signed)
Breo sent to pharmacy

## 2020-12-12 ENCOUNTER — Other Ambulatory Visit: Payer: Self-pay | Admitting: Family Medicine

## 2021-01-09 ENCOUNTER — Encounter: Payer: Self-pay | Admitting: Family Medicine

## 2021-01-09 ENCOUNTER — Ambulatory Visit (INDEPENDENT_AMBULATORY_CARE_PROVIDER_SITE_OTHER): Payer: No Typology Code available for payment source | Admitting: Family Medicine

## 2021-01-09 VITALS — BP 107/62 | HR 68 | Temp 97.2°F | Ht 69.0 in | Wt 243.4 lb

## 2021-01-09 DIAGNOSIS — E782 Mixed hyperlipidemia: Secondary | ICD-10-CM

## 2021-01-09 DIAGNOSIS — K219 Gastro-esophageal reflux disease without esophagitis: Secondary | ICD-10-CM

## 2021-01-09 DIAGNOSIS — Z0001 Encounter for general adult medical examination with abnormal findings: Secondary | ICD-10-CM | POA: Diagnosis not present

## 2021-01-09 DIAGNOSIS — Z8709 Personal history of other diseases of the respiratory system: Secondary | ICD-10-CM

## 2021-01-09 DIAGNOSIS — Z23 Encounter for immunization: Secondary | ICD-10-CM

## 2021-01-09 DIAGNOSIS — Z125 Encounter for screening for malignant neoplasm of prostate: Secondary | ICD-10-CM

## 2021-01-09 DIAGNOSIS — E349 Endocrine disorder, unspecified: Secondary | ICD-10-CM

## 2021-01-09 DIAGNOSIS — I10 Essential (primary) hypertension: Secondary | ICD-10-CM

## 2021-01-09 DIAGNOSIS — Z0000 Encounter for general adult medical examination without abnormal findings: Secondary | ICD-10-CM

## 2021-01-09 MED ORDER — ESOMEPRAZOLE MAGNESIUM 20 MG PO CPDR: DELAYED_RELEASE_CAPSULE | ORAL | 3 refills | Status: DC

## 2021-01-09 MED ORDER — SILDENAFIL CITRATE 100 MG PO TABS: 100.0000 mg | ORAL_TABLET | Freq: Every day | ORAL | 5 refills | Status: DC | PRN

## 2021-01-09 MED ORDER — CELECOXIB 200 MG PO CAPS: 200.0000 mg | ORAL_CAPSULE | Freq: Every day | ORAL | 5 refills | Status: DC

## 2021-01-09 MED ORDER — PSYLLIUM 30.9 % PO POWD: ORAL | 11 refills | Status: AC

## 2021-01-09 MED ORDER — FLUTICASONE FUROATE-VILANTEROL 200-25 MCG/ACT IN AEPB: 1.0000 | INHALATION_SPRAY | Freq: Every day | RESPIRATORY_TRACT | 11 refills | Status: DC

## 2021-01-09 NOTE — Patient Instructions (Signed)
Mr. Roy Lawrence [facility] [place] [place] [employer] should be ready to go

## 2021-01-09 NOTE — Progress Notes (Signed)
Subjective:  Patient ID: Roy Lawrence, male    DOB: 07-07-1970  Age: 50 y.o. MRN: [id number]  CC: Annual Exam   HPI [name] presents for CPE.   . Follow up for testosterone deficiency: Pt. Using medication as directed. Denies any sx referrable to DVT such as edema or erythema of legs. No dyspnea or chest pain. Energy level reported as being good. Libido is normal and denies E.D. Feels strength is adequate and improved from baseline.   Follow-up of hypertension. Patient has no history of headache chest pain or shortness of breath or recent cough. Patient also denies symptoms of TIA such as numbness weakness lateralizing. Patient checks  blood pressure at home and has not had any elevated readings recently. Patient denies side effects from his medication. States taking it regularly.   Depression screen [facility] 2/9 01/09/2021 01/09/2021 01/09/2021  Decreased Interest 0 0 0  Down, Depressed, Hopeless 0 0 0  PHQ - 2 Score 0 0 0  Altered sleeping 1 - -  Tired, decreased energy 1 - -  Change in appetite 0 - -  Feeling bad or failure about yourself  0 - -  Trouble concentrating 0 - -  Moving slowly or fidgety/restless 0 - -  Suicidal thoughts 0 - -  PHQ-9 Score 2 - -  Difficult doing work/chores Not difficult at all - -  Some encounter information is confidential and restricted. Go to Review Flowsheets activity to see all data.    History Biff has a past medical history of Allergy, Anxiety, Asthma, Depression, Diverticulitis, Diverticulosis, Esophageal stricture, GERD (gastroesophageal reflux disease), Hypertension, Obesity, and Spondylosis.   He has a past surgical history that includes Shoulder surgery (Right); Finger surgery (Right); and Esophageal dilation.   His family history includes Asthma in his mother; Dementia in his maternal grandfather and maternal grandmother; Diabetes in his father; Diverticulitis in his mother; GER disease in his mother; Berenice Primas' disease in his  father; Hodgkin's lymphoma in his father; Hyperlipidemia in his mother; Hypertension in his maternal grandfather, maternal grandmother, and mother; Thyroid disease in his father and sister.He reports that he has never smoked. He has never used smokeless tobacco. He reports current alcohol use. He reports that he does not use drugs.    ROS Review of Systems  Constitutional:  Negative for activity change, fatigue and unexpected weight change.  HENT:  Negative for congestion, ear pain, hearing loss, postnasal drip and trouble swallowing.   Eyes:  Negative for pain and visual disturbance.  Respiratory:  Positive for chest tightness. Negative for cough and shortness of breath.   Cardiovascular:  Negative for chest pain, palpitations and leg swelling.  Gastrointestinal:  Positive for abdominal pain (left flank, dull, moderate ache. Tender to touch.) and diarrhea (BMs are regular, but vary between hard and loose.). Negative for abdominal distention, anal bleeding, blood in stool, nausea and vomiting.  Endocrine: Negative for cold intolerance, heat intolerance and polydipsia.  Genitourinary:  Negative for difficulty urinating, dysuria, flank pain, frequency and urgency.  Musculoskeletal:  Positive for arthralgias (hip pain - tightens when he sits). Negative for joint swelling.  Skin:  Negative for color change, rash and wound.  Neurological:  Negative for dizziness, syncope, speech difficulty, weakness, light-headedness, numbness and headaches.  Hematological:  Does not bruise/bleed easily.  Psychiatric/Behavioral:  Negative for confusion, decreased concentration, dysphoric mood and sleep disturbance. The patient is not nervous/anxious.    Objective:  BP 107/62   Pulse 68  Temp (!) 97.2 F (36.2 C)   Ht 5' 9" (1.753 m)   Wt 243 lb 6.4 oz (110.4 kg)   SpO2 96%   BMI 35.94 kg/m   BP Readings from Last 3 Encounters:  01/09/21 107/62  10/10/20 (!) 97/57  04/10/20 105/69    Wt Readings from  Last 3 Encounters:  01/09/21 243 lb 6.4 oz (110.4 kg)  10/10/20 233 lb 12.8 oz (106.1 kg)  04/10/20 250 lb 3.2 oz (113.5 kg)     Physical Exam Constitutional:      Appearance: He is well-developed.  HENT:     Head: Normocephalic and atraumatic.  Eyes:     Pupils: Pupils are equal, round, and reactive to light.  Neck:     Thyroid: No thyromegaly.     Trachea: No tracheal deviation.  Cardiovascular:     Rate and Rhythm: Normal rate and regular rhythm.     Heart sounds: Normal heart sounds. No murmur heard.   No friction rub. No gallop.  Pulmonary:     Breath sounds: Normal breath sounds. No wheezing or rales.  Abdominal:     General: Bowel sounds are normal. There is no distension.     Palpations: Abdomen is soft. There is no mass.     Tenderness: There is no abdominal tenderness.     Hernia: There is no hernia in the left inguinal area.  Genitourinary:    Penis: Normal.      Testes: Normal.  Musculoskeletal:        General: Normal range of motion.     Cervical back: Normal range of motion.  Lymphadenopathy:     Cervical: No cervical adenopathy.  Skin:    General: Skin is warm and dry.  Neurological:     Mental Status: He is alert and oriented to person, place, and time.      Assessment & Plan:   Jericho was seen today for annual exam.  Diagnoses and all orders for this visit:  Well adult exam  Mixed hyperlipidemia -     Lipid panel  Primary hypertension -     CBC with Differential/Platelet -     CMP14+EGFR  Need for shingles vaccine -     Varicella-zoster vaccine IM (Shingrix)  History of asthma  Gastroesophageal reflux disease, unspecified whether esophagitis present  Screening for prostate cancer -     PSA, total and free  Testosterone deficiency -     Testosterone,Free and Total  Other orders -     esomeprazole (NEXIUM 24HR) 20 MG capsule; Take two capsules twice a day on an empty stomach -     celecoxib (CELEBREX) 200 MG capsule; Take 1  capsule (200 mg total) by mouth daily. With food -     Psyllium 30.9 % POWD; Drink 1 tablespoon dissolved in water, twice Daily -     fluticasone furoate-vilanterol (BREO ELLIPTA) 200-25 MCG/ACT AEPB; Inhale 1 puff into the lungs daily. -     sildenafil (VIAGRA) 100 MG tablet; Take 1 tablet (100 mg total) by mouth daily as needed for erectile dysfunction.      I have discontinued Leonie Green. Esguerra's Breo Ellipta. I have also changed his esomeprazole. Additionally, I am having him start on celecoxib, Psyllium, and fluticasone furoate-vilanterol. Lastly, I am having him maintain his Magnesium Oxide (MAG-OX PO), Vitamin D3, acetaminophen, b complex vitamins, diphenhydrAMINE, rizatriptan, Melatonin, nebivolol, Testosterone, albuterol, fexofenadine, traZODone, and sildenafil.  Allergies as of 01/09/2021       Reactions  Diovan [valsartan] Swelling   Quinapril Hcl Swelling   Quinapril Hcl         Medication List        Accurate as of January 09, 2021 11:59 PM. If you have any questions, ask your nurse or doctor.          STOP taking these medications    Breo Ellipta 100-25 MCG/ACT Aepb Generic drug: fluticasone furoate-vilanterol Replaced by: fluticasone furoate-vilanterol 200-25 MCG/ACT Aepb Stopped by: Claretta Fraise, MD       TAKE these medications    acetaminophen 325 MG tablet Commonly known as: TYLENOL Take 650 mg by mouth every 6 (six) hours as needed for pain.   albuterol 108 (90 Base) MCG/ACT inhaler Commonly known as: Ventolin HFA Inhale 1 puff into the lungs every 6 (six) hours as needed for wheezing.   b complex vitamins tablet Take 1 tablet by mouth daily.   celecoxib 200 MG capsule Commonly known as: CeleBREX Take 1 capsule (200 mg total) by mouth daily. With food Started by: Claretta Fraise, MD   diphenhydrAMINE 25 mg capsule Commonly known as: BENADRYL Take 25 mg by mouth every 6 (six) hours as needed.   esomeprazole 20 MG capsule Commonly known  as: NexIUM 24HR Take two capsules twice a day on an empty stomach What changed: additional instructions Changed by: Claretta Fraise, MD   fexofenadine 180 MG tablet Commonly known as: ALLEGRA Take 1 tablet (180 mg total) by mouth daily. For allergy symptoms   fluticasone furoate-vilanterol 200-25 MCG/ACT Aepb Commonly known as: Breo Ellipta Inhale 1 puff into the lungs daily. Replaces: Breo Ellipta 100-25 MCG/ACT Aepb Started by: Claretta Fraise, MD   MAG-OX PO Take 1 tablet by mouth daily.   Melatonin 10 MG Tabs Take 70 mg by mouth.   nebivolol 10 MG tablet Commonly known as: Bystolic Take 1 tablet (10 mg total) by mouth daily. (Needs to be seen before next refill)   Psyllium 30.9 % Powd Drink 1 tablespoon dissolved in water, twice Daily Started by: Claretta Fraise, MD   rizatriptan 10 MG disintegrating tablet Commonly known as: Maxalt-MLT Take 1 tablet (10 mg total) by mouth as needed for migraine. May repeat in 2 hours if needed   sildenafil 100 MG tablet Commonly known as: Viagra Take 1 tablet (100 mg total) by mouth daily as needed for erectile dysfunction. Started by: Claretta Fraise, MD   Testosterone 30 MG/ACT Soln APPPLY TWO PUMPS TO EACH TO THE ARM ONCE DAILY   traZODone 150 MG tablet Commonly known as: DESYREL TAKE 1/3 TO ONE TABLET BY MOUTH NIGHTLY FOR SLEEP   Vitamin D3 125 MCG (5000 UT) Caps Take 5,000 Units by mouth daily.         Follow-up: Return in about 6 months (around 07/10/2021).  Claretta Fraise, M.D.

## 2021-01-12 ENCOUNTER — Other Ambulatory Visit: Payer: Self-pay | Admitting: Family Medicine

## 2021-01-12 ENCOUNTER — Encounter: Payer: Self-pay | Admitting: Family Medicine

## 2021-01-12 LAB — LIPID PANEL
Chol/HDL Ratio: 5.2 ratio — ABNORMAL HIGH (ref 0.0–5.0)
Cholesterol, Total: 170 mg/dL (ref 100–199)
HDL: 33 mg/dL — ABNORMAL LOW (ref >39)
LDL Chol Calc (NIH): 120 mg/dL — ABNORMAL HIGH (ref 0–99)
Triglycerides: 93 mg/dL (ref 0–149)
VLDL Cholesterol Cal: 17 mg/dL (ref 5–40)

## 2021-01-12 LAB — TESTOSTERONE,FREE AND TOTAL
Testosterone, Free: 5.3 pg/mL — ABNORMAL LOW (ref 7.2–24.0)
Testosterone: 217 ng/dL — ABNORMAL LOW (ref 264–916)

## 2021-01-12 LAB — CBC WITH DIFFERENTIAL/PLATELET
Basophils Absolute: 0.1 10*3/uL (ref 0.0–0.2)
Basos: 1 %
EOS (ABSOLUTE): 0.2 10*3/uL (ref 0.0–0.4)
Eos: 4 %
Hematocrit: 41.3 % (ref 37.5–51.0)
Hemoglobin: 14.4 g/dL (ref 13.0–17.7)
Immature Grans (Abs): 0 10*3/uL (ref 0.0–0.1)
Immature Granulocytes: 1 %
Lymphocytes Absolute: 1.5 10*3/uL (ref 0.7–3.1)
Lymphs: 32 %
MCH: 29.8 pg (ref 26.6–33.0)
MCHC: 34.9 g/dL (ref 31.5–35.7)
MCV: 86 fL (ref 79–97)
Monocytes Absolute: 0.4 10*3/uL (ref 0.1–0.9)
Monocytes: 8 %
Neutrophils Absolute: 2.6 10*3/uL (ref 1.4–7.0)
Neutrophils: 54 %
Platelets: 163 10*3/uL (ref 150–450)
RBC: 4.83 x10E6/uL (ref 4.14–5.80)
RDW: 11.7 % (ref 11.6–15.4)
WBC: 4.7 10*3/uL (ref 3.4–10.8)

## 2021-01-12 LAB — CMP14+EGFR
ALT: 22 IU/L (ref 0–44)
AST: 21 IU/L (ref 0–40)
Albumin/Globulin Ratio: 2.3 — ABNORMAL HIGH (ref 1.2–2.2)
Albumin: 4.4 g/dL (ref 3.8–4.9)
Alkaline Phosphatase: 73 IU/L (ref 44–121)
BUN/Creatinine Ratio: 12 (ref 9–20)
BUN: 16 mg/dL (ref 6–24)
Bilirubin Total: 0.2 mg/dL (ref 0.0–1.2)
CO2: 27 mmol/L (ref 20–29)
Calcium: 9.5 mg/dL (ref 8.7–10.2)
Chloride: 103 mmol/L (ref 96–106)
Creatinine, Ser: 1.29 mg/dL — ABNORMAL HIGH (ref 0.76–1.27)
Globulin, Total: 1.9 g/dL (ref 1.5–4.5)
Glucose: 93 mg/dL (ref 70–99)
Potassium: 5.1 mmol/L (ref 3.5–5.2)
Sodium: 142 mmol/L (ref 134–144)
Total Protein: 6.3 g/dL (ref 6.0–8.5)
eGFR: 67 mL/min/{1.73_m2} (ref >59)

## 2021-01-12 LAB — PSA, TOTAL AND FREE
PSA, Free Pct: 23.1 %
PSA, Free: 0.3 ng/mL
Prostate Specific Ag, Serum: 1.3 ng/mL (ref 0.0–4.0)

## 2021-01-18 ENCOUNTER — Other Ambulatory Visit: Payer: Self-pay | Admitting: Family Medicine

## 2021-01-29 ENCOUNTER — Encounter: Payer: Self-pay | Admitting: Nurse Practitioner

## 2021-02-14 ENCOUNTER — Ambulatory Visit (INDEPENDENT_AMBULATORY_CARE_PROVIDER_SITE_OTHER): Payer: No Typology Code available for payment source | Admitting: Gastroenterology

## 2021-02-14 ENCOUNTER — Encounter: Payer: Self-pay | Admitting: Gastroenterology

## 2021-02-14 VITALS — BP 130/90 | HR 73 | Ht 69.0 in | Wt 252.6 lb

## 2021-02-14 DIAGNOSIS — K625 Hemorrhage of anus and rectum: Secondary | ICD-10-CM

## 2021-02-14 DIAGNOSIS — R1032 Left lower quadrant pain: Secondary | ICD-10-CM | POA: Diagnosis not present

## 2021-02-14 DIAGNOSIS — K219 Gastro-esophageal reflux disease without esophagitis: Secondary | ICD-10-CM

## 2021-02-14 MED ORDER — DICYCLOMINE HCL 20 MG PO TABS: 20.0000 mg | ORAL_TABLET | Freq: Three times a day (TID) | ORAL | 2 refills | Status: DC

## 2021-02-14 MED ORDER — NA SULFATE-K SULFATE-MG SULF 17.5-3.13-1.6 GM/177ML PO SOLN: ORAL | 0 refills | Status: DC

## 2021-02-14 NOTE — Progress Notes (Signed)
Reviewed. Plan noted  °

## 2021-02-14 NOTE — Progress Notes (Signed)
02/14/2021 Roy Lawrence [id number] 04/14/1970   HISTORY OF PRESENT ILLNESS: This is a 51 year old male who is a patient of Dr. Blanch Media.  He has not been seen here since 2015 at which time he had a colonoscopy as listed below.  He is here today with complaints of left lower quadrant abdominal pain.  He tells me that this has been intermittent, coming and going, for at least the past year or so.  He says that sometimes it is an aching type pain and sometimes it feels like a knife stabbing in his side.  He says sometimes it will last a whole day, sometimes a couple of days.  He has taken Tylenol and ibuprofen with small amount of relief in the past.  He says that the worst that it gets is about a 5 or 6 out of 10 on the pain scale.  He says that he is taking Metamucil and moving his bowels regularly without any major changes or issues.  He does see occasional bright red blood on the toilet paper upon wiping if he gets irritated in his perianal area, etc.  His PCP ordered a CT scan of the abdomen and pelvis with contrast 1 year ago, performed on February 27, 2020, for evaluation of this pain.  CT scan of the abdomen and pelvis with contrast:  IMPRESSION: 1. No acute or inflammatory process identified in the abdomen or pelvis. 2. Widespread diverticulosis of the large bowel, most pronounced in the left lower quadrant. But no active inflammation identified. 3. Chronic right nephrolithiasis. 4. 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   Colonoscopy January 2015 by Dr. Henrene Pastor showed diverticulosis throughout the colon.  Was performed for abnormal CT scan and a diagnosis of diverticulitis at that time.  In regards to his reflux issues he is on Nexium 40 mg twice daily, purchasing this over-the-counter.  He has very occasional issues with swallowing where feels like something is slow to go down, but then he will burp or take a drink and it goes right down.  Last EGD with  dilation was ? 02/2008 at which time he had a peptic stricture that was dilated to 18 mm.  When he saw Dr. Henrene Pastor in January 2015 he reported mild recurrent issues with swallowing, but declined EGD with dilation at that time.  Past Medical History:  Diagnosis Date   Allergy    SEASONAL   Anxiety    Asthma    Depression    Diverticulitis    Diverticulosis    Esophageal stricture    GERD (gastroesophageal reflux disease)    Hypertension    Prehypertension   Obesity    Spondylosis    Past Surgical History:  Procedure Laterality Date   ESOPHAGEAL DILATION     FINGER SURGERY Right    3rd digit; imbedded glass   SHOULDER SURGERY Right    x 2    reports that he has never smoked. He has never used smokeless tobacco. He reports current alcohol use. He reports that he does not use drugs. family history includes Asthma in his mother; Dementia in his maternal grandfather and maternal grandmother; Diabetes in his father; Diverticulitis in his mother; GER disease in his mother; Berenice Primas' disease in his father; Hodgkin's lymphoma in his father; Hyperlipidemia in his mother; Hypertension in his maternal grandfather, maternal grandmother, and mother; Thyroid disease in his father and sister. Allergies  Allergen Reactions   Diovan [Valsartan] Swelling   Quinapril Hcl  Swelling   Quinapril Hcl       Outpatient Encounter Medications as of 02/14/2021  Medication Sig   acetaminophen (TYLENOL) 325 MG tablet Take 650 mg by mouth every 6 (six) hours as needed for pain.   albuterol (VENTOLIN HFA) 108 (90 Base) MCG/ACT inhaler Inhale 1 puff into the lungs every 6 (six) hours as needed for wheezing.   b complex vitamins tablet Take 1 tablet by mouth daily.   celecoxib (CELEBREX) 200 MG capsule Take 1 capsule (200 mg total) by mouth daily. With food   Cholecalciferol (VITAMIN D3) 5000 UNITS CAPS Take 5,000 Units by mouth daily.    esomeprazole (NEXIUM 24HR) 20 MG capsule Take two capsules twice a day on an  empty stomach   fexofenadine (ALLEGRA) 180 MG tablet Take 1 tablet (180 mg total) by mouth daily. For allergy symptoms   fluticasone furoate-vilanterol (BREO ELLIPTA) 200-25 MCG/ACT AEPB Inhale 1 puff into the lungs daily.   Magnesium Oxide (MAG-OX PO) Take 1 tablet by mouth daily.    Melatonin 10 MG TABS Take 70 mg by mouth.   nebivolol (BYSTOLIC) 10 MG tablet Take 1 tablet (10 mg total) by mouth daily. (Needs to be seen before next refill)   Psyllium 30.9 % POWD Drink 1 tablespoon dissolved in water, twice Daily   rizatriptan (MAXALT-MLT) 10 MG disintegrating tablet TAKE 1 TABLET BY MOUTH AS NEEDED FOR migraine MAY REPEAT in TWO hours if neeeded   sildenafil (VIAGRA) 100 MG tablet Take 1 tablet (100 mg total) by mouth daily as needed for erectile dysfunction.   Testosterone 30 MG/ACT SOLN APPPLY TWO PUMPS TO EACH TO THE ARM ONCE DAILY   traZODone (DESYREL) 150 MG tablet TAKE 1/3 TO ONE TABLET BY MOUTH NIGHTLY FOR SLEEP   diphenhydrAMINE (BENADRYL) 25 mg capsule Take 25 mg by mouth every 6 (six) hours as needed.   No facility-administered encounter medications on file as of 02/14/2021.     REVIEW OF SYSTEMS  : All other systems reviewed and negative except where noted in the History of Present Illness.   PHYSICAL EXAM: BP 130/90    Pulse 73    Ht 5\' 9"  (1.753 m)    Wt 252 lb 9.6 oz (114.6 kg)    SpO2 97%    BMI 37.30 kg/m  General: Well developed white male in no acute distress Head: Normocephalic and atraumatic Eyes:  Sclerae anicteric, conjunctiva pink. Ears: Normal auditory acuity Lungs: Clear throughout to auscultation; no W/R/R. Heart: Regular rate and rhythm; no M/R/G. Abdomen: Soft, non-distended.  BS present.  LLQ TTP. Rectal:  Will be done at the time of colonoscopy. Musculoskeletal: Symmetrical with no gross deformities  Skin: No lesions on visible extremities Extremities: No edema  Neurological: Alert oriented x 4, grossly non-focal Psychological:  Alert and cooperative.  Normal mood and affect  ASSESSMENT AND PLAN: *Intermittent left lower quadrant abdominal pain for at least a year.  CT scan in January 2022 showed diverticulosis, no other cause of pain seen within the abdomen.  He may just have intermittent intestinal spasm/symptomatic diverticulosis.  Question if he could have some SCAD.  Question if its somewhat more musculoskeletal issue such as abdominal wall strain or radiated pain from his back as he does have back issues as well.  Last colonoscopy was 8 years ago.  He would really like to have one to evaluate and rule anything else out.  We will schedule with Dr. Henrene Pastor.  We will try dicyclomine 20 mg 3 times  daily as needed for intestinal spasm and cramping and see if that helps.  Prescription sent to pharmacy.  The risks, benefits, and alternatives to colonoscopy were discussed with the patient and he consents to proceed.  *Rectal bleeding: Minimal amounts intermittently on the toilet paper, bright red, that he attributes to wiping,/hemorrhoid,/outlet bleeding. *GERD with history of peptic stricture: Says that only very occasionally will feel as if something is slow to go down (he reported this same complaint in 02/2013 so it does not sound like it has really even worsened since then).  Last EGD with dilation was ? 02/2008.  Repeat dilation if he desires our symptoms worsen.  Continue Nexium 40 mg BID.   CC:  Claretta Fraise, MD

## 2021-02-14 NOTE — Patient Instructions (Signed)
You have been scheduled for a colonoscopy. Please follow written instructions given to you at your visit today.  Please pick up your prep supplies at the pharmacy within the next 1-3 days. If you use inhalers (even only as needed), please bring them with you on the day of your procedure.    We have sent the following medications to your pharmacy for you to pick up at your convenience: Dicyclomine and Suprep  Due to recent changes in healthcare laws, you may see the results of your imaging and laboratory studies on MyChart before your provider has had a chance to review them.  We understand that in some cases there may be results that are confusing or concerning to you. Not all laboratory results come back in the same time frame and the provider may be waiting for multiple results in order to interpret others.  Please give [place] 48 hours in order for your provider to thoroughly review all the results before contacting the office for clarification of your results.   If you are age 53 or older, your body mass index should be between 23-30. Your Body mass index is 37.3 kg/m. If this is out of the aforementioned range listed, please consider follow up with your Primary Care Provider.  If you are age 53 or younger, your body mass index should be between 19-25. Your Body mass index is 37.3 kg/m. If this is out of the aformentioned range listed, please consider follow up with your Primary Care Provider.   ________________________________________________________  The [facility] providers would like to encourage you to use [facility] to communicate with providers for non-urgent requests or questions.  Due to long hold times on the telephone, sending your provider a message by [facility] may be a faster and more efficient way to get a response.  Please allow 48 business hours for a response.  Please remember that this is for non-urgent requests.  _______________________________________________________

## 2021-02-17 ENCOUNTER — Encounter: Payer: Self-pay | Admitting: Internal Medicine

## 2021-02-17 ENCOUNTER — Other Ambulatory Visit: Payer: Self-pay

## 2021-02-17 ENCOUNTER — Ambulatory Visit (AMBULATORY_SURGERY_CENTER): Payer: No Typology Code available for payment source | Admitting: Internal Medicine

## 2021-02-17 VITALS — BP 101/55 | HR 66 | Temp 98.4°F | Resp 12 | Ht 69.0 in | Wt 252.0 lb

## 2021-02-17 DIAGNOSIS — D123 Benign neoplasm of transverse colon: Secondary | ICD-10-CM

## 2021-02-17 DIAGNOSIS — R1032 Left lower quadrant pain: Secondary | ICD-10-CM

## 2021-02-17 DIAGNOSIS — Z1211 Encounter for screening for malignant neoplasm of colon: Secondary | ICD-10-CM

## 2021-02-17 DIAGNOSIS — D128 Benign neoplasm of rectum: Secondary | ICD-10-CM

## 2021-02-17 DIAGNOSIS — K625 Hemorrhage of anus and rectum: Secondary | ICD-10-CM

## 2021-02-17 DIAGNOSIS — K5730 Diverticulosis of large intestine without perforation or abscess without bleeding: Secondary | ICD-10-CM | POA: Diagnosis present

## 2021-02-17 MED ORDER — SODIUM CHLORIDE 0.9 % IV SOLN: 500.0000 mL | Freq: Once | INTRAVENOUS | Status: DC

## 2021-02-17 NOTE — Progress Notes (Signed)
A/ox3, pleased with MAC, report to RN 

## 2021-02-17 NOTE — Patient Instructions (Signed)
Please read handouts provided. Continue present medications. Await pathology results.   YOU HAD AN ENDOSCOPIC PROCEDURE TODAY AT THE [facility]:   Refer to the procedure report that was given to you for any specific questions about what was found during the examination.  If the procedure report does not answer your questions, please call your gastroenterologist to clarify.  If you requested that your care partner not be given the details of your procedure findings, then the procedure report has been included in a sealed envelope for you to review at your convenience later.  YOU SHOULD EXPECT: Some feelings of bloating in the abdomen. Passage of more gas than usual.  Walking can help get rid of the air that was put into your GI tract during the procedure and reduce the bloating. If you had a lower endoscopy (such as a colonoscopy or flexible sigmoidoscopy) you may notice spotting of blood in your stool or on the toilet paper. If you underwent a bowel prep for your procedure, you may not have a normal bowel movement for a few days.  Please Note:  You might notice some irritation and congestion in your nose or some drainage.  This is from the oxygen used during your procedure.  There is no need for concern and it should clear up in a day or so.  SYMPTOMS TO REPORT IMMEDIATELY:  Following lower endoscopy (colonoscopy or flexible sigmoidoscopy):  Excessive amounts of blood in the stool  Significant tenderness or worsening of abdominal pains  Swelling of the abdomen that is new, acute  Fever of 100F or higher   For urgent or emergent issues, a gastroenterologist can be reached at any hour by calling [phone]. Do not use MyChart messaging for urgent concerns.    DIET:  We do recommend a small meal at first, but then you may proceed to your regular diet.  Drink plenty of fluids but you should avoid alcoholic beverages for 24 hours.  ACTIVITY:  You should plan to take it easy  for the rest of today and you should NOT DRIVE or use heavy machinery until tomorrow (because of the sedation medicines used during the test).    FOLLOW UP: Our staff will call the number listed on your records 48-72 hours following your procedure to check on you and address any questions or concerns that you may have regarding the information given to you following your procedure. If we do not reach you, we will leave a message.  We will attempt to reach you two times.  During this call, we will ask if you have developed any symptoms of COVID 19. If you develop any symptoms (ie: fever, flu-like symptoms, shortness of breath, cough etc.) before then, please call [phone].  If you test positive for Covid 19 in the 2 weeks post procedure, please call and report this information to us.    If any biopsies were taken you will be contacted by phone or by letter within the next 1-3 weeks.  Please call us at [phone] if you have not heard about the biopsies in 3 weeks.    SIGNATURES/CONFIDENTIALITY: You and/or your care partner have signed paperwork which will be entered into your electronic medical record.  These signatures attest to the fact that that the information above on your After Visit Summary has been reviewed and is understood.  Full responsibility of the confidentiality of this discharge information lies with you and/or your care-partner.  

## 2021-02-17 NOTE — Op Note (Signed)
[employer] Patient Name: Roy Lawrence Procedure Date: 02/17/2021 9:09 AM MRN: [id number] Endoscopist: Docia Chuck. Henrene Pastor , MD Age: 51 Referring MD:  Date of Birth: May 01, 1970 Gender: Male Account #: [id number] Procedure:                Colonoscopy with cold snare polypectomy x 2; with                            biopsy polypectomy Indications:              Screening for colorectal malignant neoplasm. Has a                            history of diverticulitis. Recent evaluation for                            recurrent left-sided discomfort. Negative CT 1 year                            ago Medicines:                Monitored Anesthesia Care Procedure:                Pre-Anesthesia Assessment:                           - Prior to the procedure, a History and Physical                            was performed, and patient medications and                            allergies were reviewed. The patient's tolerance of                            previous anesthesia was also reviewed. The risks                            and benefits of the procedure and the sedation                            options and risks were discussed with the patient.                            All questions were answered, and informed consent                            was obtained. Prior Anticoagulants: The patient has                            taken no previous anticoagulant or antiplatelet                            agents. ASA Grade Assessment: II - A patient with  mild systemic disease. After reviewing the risks                            and benefits, the patient was deemed in                            satisfactory condition to undergo the procedure.                           After obtaining informed consent, the colonoscope                            was passed under direct vision. Throughout the                            procedure, the patient's blood pressure, pulse, and                             oxygen saturations were monitored continuously. The                            CF [id number] #[id number] was introduced through the anus                            and advanced to the the cecum, identified by                            appendiceal orifice and ileocecal valve. The                            ileocecal valve, appendiceal orifice, and rectum                            were photographed. The quality of the bowel                            preparation was excellent. The colonoscopy was                            performed without difficulty. The patient tolerated                            the procedure well. The bowel preparation used was                            SUPREP via split dose instruction. Scope In: 9:42:03 AM Scope Out: 9:57:57 AM Scope Withdrawal Time: 0 hours 14 minutes 24 seconds  Total Procedure Duration: 0 hours 15 minutes 54 seconds  Findings:                 A 1 mm polyp was found in the rectum. The polyp was                            removed with a cold biopsy  forceps. Resection and                            retrieval were complete.                           Two polyps were found in the transverse colon. The                            polyps were 2 to 3 mm in size. These polyps were                            removed with a cold snare. Resection and retrieval                            were complete.                           Many small and large-mouthed diverticula were found                            in the entire colon.                           Internal hemorrhoids were found during retroflexion. Complications:            No immediate complications. Estimated blood loss:                            None. Estimated Blood Loss:     Estimated blood loss: none. Impression:               - One 1 mm polyp in the rectum, removed with a cold                            biopsy forceps. Resected and retrieved.                           - Two 2  to 3 mm polyps in the transverse colon,                            removed with a cold snare. Resected and retrieved.                           - Diverticulosis in the entire examined colon.                           - Internal hemorrhoids. Recommendation:           - Repeat colonoscopy in 5 years for surveillance if                            all polyps adenomatous, otherwise 7 years.                           - Patient has a  contact number available for                            emergencies. The signs and symptoms of potential                            delayed complications were discussed with the                            patient. Return to normal activities tomorrow.                            Written discharge instructions were provided to the                            patient.                           - Resume previous diet.                           - Continue present medications.                           - Await pathology results. Docia Chuck. Henrene Pastor, MD 02/17/2021 10:05:35 AM This report has been signed electronically.

## 2021-02-17 NOTE — Progress Notes (Signed)
Called to room to assist during endoscopic procedure.  Patient ID and intended procedure confirmed with present staff. Received instructions for my participation in the procedure from the performing physician.  

## 2021-02-17 NOTE — Progress Notes (Signed)
HISTORY OF PRESENT ILLNESS:  Roy Lawrence is a 51 y.o. male with a history of diverticulitis who was recently evaluated in the office by the GI physician assistant February 14, 2021 regarding left lower quadrant pain and minor rectal bleeding.  Prior remote diagnostic colonoscopy.  Now for routine screening.  See that complete H&P.  No interval changes  REVIEW OF SYSTEMS:  All non-GI ROS negative. Past Medical History:  Diagnosis Date   Allergy    SEASONAL   Anxiety    Asthma    Depression    Diverticulitis    Diverticulosis    Esophageal stricture    GERD (gastroesophageal reflux disease)    Heart murmur    Hypertension    Prehypertension   Obesity    Sleep apnea    Spondylosis     Past Surgical History:  Procedure Laterality Date   ESOPHAGEAL DILATION     FINGER SURGERY Right    3rd digit; imbedded glass   SHOULDER SURGERY Right    x 2    Social History Roy Lawrence  reports that he has never smoked. He has never used smokeless tobacco. He reports current alcohol use. He reports that he does not use drugs.  family history includes Asthma in his mother; Dementia in his maternal grandfather and maternal grandmother; Diabetes in his father; Diverticulitis in his mother; GER disease in his mother; Berenice Primas' disease in his father; Hodgkin's lymphoma in his father; Hyperlipidemia in his mother; Hypertension in his maternal grandfather, maternal grandmother, and mother; Liver cancer in his father; Thyroid disease in his father and sister.  Allergies  Allergen Reactions   Diovan [Valsartan] Swelling   Quinapril Hcl Swelling   Quinapril Hcl        PHYSICAL EXAMINATION:  Vital signs: BP 128/78    Pulse 71    Temp 98.4 F (36.9 C)    Ht 5\' 9"  (1.753 m)    Wt 252 lb (114.3 kg)    SpO2 99%    BMI 37.21 kg/m  General: Well-developed, well-nourished, no acute distress HEENT: Sclerae are anicteric, conjunctiva pink. Oral mucosa intact Lungs: Clear Heart: Regular Abdomen:  soft, nontender, nondistended, no obvious ascites, no peritoneal signs, normal bowel sounds. No organomegaly. Extremities: No edema Psychiatric: alert and oriented x3. Cooperative     ASSESSMENT AND PLAN: *Intermittent left lower quadrant abdominal pain for at least a year.  CT scan in January 2022 showed diverticulosis, no other cause of pain seen within the abdomen.  He may just have intermittent intestinal spasm/symptomatic diverticulosis.  Question if he could have some SCAD.  Question if its somewhat more musculoskeletal issue such as abdominal wall strain or radiated pain from his back as he does have back issues as well.  Last colonoscopy was 8 years ago.  He would really like to have one to evaluate and rule anything else out.  We will schedule with Dr. Henrene Pastor.  We will try dicyclomine 20 mg 3 times daily as needed for intestinal spasm and cramping and see if that helps.  Prescription sent to pharmacy.  The risks, benefits, and alternatives to colonoscopy were discussed with the patient and he consents to proceed.  *Rectal bleeding: Minimal amounts intermittently on the toilet paper, bright red, that he attributes to wiping,/hemorrhoid,/outlet bleeding. *GERD with history of peptic stricture: Says that only very occasionally will feel as if something is slow to go down (he reported this same complaint in 02/2013 so it does not sound like it  has really even worsened since then).  Last EGD with dilation was ? 02/2008.  Repeat dilation if he desires our symptoms worsen.  Continue Nexium 40 mg BID.

## 2021-02-19 ENCOUNTER — Encounter: Payer: Self-pay | Admitting: Internal Medicine

## 2021-02-19 ENCOUNTER — Telehealth: Payer: Self-pay | Admitting: *Deleted

## 2021-02-19 NOTE — Telephone Encounter (Signed)
°  Follow up Call-  Call back number 02/17/2021  Post procedure Call Back phone  # [phone]  Permission to leave phone message Yes  Some recent data might be hidden     Patient questions:  Do you have a fever, pain , or abdominal swelling? No. Pain Score  0 *  Have you tolerated food without any problems? Yes.    Have you been able to return to your normal activities? Yes.    Do you have any questions about your discharge instructions: Diet   No. Medications  No. Follow up visit  No.  Do you have questions or concerns about your Care? No.  Actions: * If pain score is 4 or above: No action needed, pain <4.

## 2021-04-18 ENCOUNTER — Ambulatory Visit: Payer: No Typology Code available for payment source | Admitting: Family Medicine

## 2021-04-18 ENCOUNTER — Encounter: Payer: Self-pay | Admitting: Family Medicine

## 2021-04-18 VITALS — BP 121/71 | HR 64 | Temp 98.2°F | Ht 69.0 in | Wt 258.2 lb

## 2021-04-18 DIAGNOSIS — E559 Vitamin D deficiency, unspecified: Secondary | ICD-10-CM | POA: Diagnosis not present

## 2021-04-18 DIAGNOSIS — R5383 Other fatigue: Secondary | ICD-10-CM

## 2021-04-18 DIAGNOSIS — R5381 Other malaise: Secondary | ICD-10-CM

## 2021-04-18 DIAGNOSIS — G8929 Other chronic pain: Secondary | ICD-10-CM

## 2021-04-18 DIAGNOSIS — R1032 Left lower quadrant pain: Secondary | ICD-10-CM | POA: Diagnosis not present

## 2021-04-18 DIAGNOSIS — R112 Nausea with vomiting, unspecified: Secondary | ICD-10-CM | POA: Diagnosis not present

## 2021-04-18 LAB — BAYER DCA HB A1C WAIVED: HB A1C (BAYER DCA - WAIVED): 5.1 % (ref 4.8–5.6)

## 2021-04-18 MED ORDER — ONDANSETRON HCL 4 MG PO TABS: 4.0000 mg | ORAL_TABLET | Freq: Three times a day (TID) | ORAL | 0 refills | Status: DC | PRN

## 2021-04-18 MED ORDER — ESOMEPRAZOLE MAGNESIUM 20 MG PO CPDR: DELAYED_RELEASE_CAPSULE | ORAL | 3 refills | Status: DC

## 2021-04-18 NOTE — Progress Notes (Signed)
?  ? ?Subjective:  ?Patient ID: Roy Lawrence, male    DOB: 1970-09-25, 51 y.o.   MRN: [id number] ? ?Patient Care Team: ?Claretta Fraise, MD as PCP - General (Family Medicine) ?Glenna Fellows, MD (Neurosurgery) ?Irene Shipper, MD (Gastroenterology)  ? ?Chief Complaint:  Fatigue ? ? ?HPI: ?Roy Lawrence is a 51 y.o. male presenting on 04/18/2021 for Fatigue ? ? ?Patient presents today with complaints of fatigue, malaise, generalized weakness, nausea with dry heaves, and headaches for the last 11 days.  States he has vomited about 5 times total over the last 11 days.  He is eating and drinking normally.  Normal bowel movements and urinary output.  He has chronic left lower quadrant pain and is Bentyl which is not beneficial.  He recently had a colonoscopy with GI, several polyps removed.  Diverticulosis without diverticulitis.  He is on Nexium 80 mg daily for GERD. He is concerned that his blood sugar is running low because the symptoms are usually present in the morning. He is on testosterone and Vit D repletion therapy. Last testosterone level 01/2021 was low and he was restarted on repletion and has been taking as prescribed. No fever, chills, confusion, weight changes, or diaphoresis.  ? ? ?Relevant past medical, surgical, family, and social history reviewed and updated as indicated.  ?Allergies and medications reviewed and updated. Data reviewed: Chart in Epic. ? ? ?Past Medical History:  ?Diagnosis Date  ? Allergy   ? SEASONAL  ? Anxiety   ? Asthma   ? Depression   ? Diverticulitis   ? Diverticulosis   ? Esophageal stricture   ? GERD (gastroesophageal reflux disease)   ? Heart murmur   ? Hypertension   ? Prehypertension  ? Obesity   ? Sleep apnea   ? Spondylosis   ? ? ?Past Surgical History:  ?Procedure Laterality Date  ? ESOPHAGEAL DILATION    ? FINGER SURGERY Right   ? 3rd digit; imbedded glass  ? SHOULDER SURGERY Right   ? x 2  ? ? ?Social History  ? ?Socioeconomic History  ? Marital status: Married  ?  Spouse  name: Misty  ? Number of children: 0  ? Years of education: 2 yrs  ? Highest education level: Not on file  ?Occupational History  ? Occupation: Engineer, structural  ?  Employer: [employer]  ?Tobacco Use  ? Smoking status: Never  ? Smokeless tobacco: Never  ?Vaping Use  ? Vaping Use: Never used  ?Substance and Sexual Activity  ? Alcohol use: Yes  ?  Alcohol/week: 0.0 standard drinks  ?  Comment: rarely less than 1 drink a week  ? Drug use: No  ? Sexual activity: Not on file  ?Other Topics Concern  ? Not on file  ?Social History Narrative  ? Patient lives at home with his spouse.  He has two children they have at home they are helping to raise.    ? Caffeine Use: 2-3 daily  ? ?Social Determinants of Health  ? ?Financial Resource Strain: Not on file  ?Food Insecurity: Not on file  ?Transportation Needs: Not on file  ?Physical Activity: Not on file  ?Stress: Not on file  ?Social Connections: Not on file  ?Intimate Partner Violence: Not on file  ? ? ?Outpatient Encounter Medications as of 04/18/2021  ?Medication Sig  ? acetaminophen (TYLENOL) 325 MG tablet Take 650 mg by mouth every 6 (six) hours as needed for pain.  ? albuterol (VENTOLIN HFA) 108 (  90 Base) MCG/ACT inhaler Inhale 1 puff into the lungs every 6 (six) hours as needed for wheezing.  ? b complex vitamins tablet Take 1 tablet by mouth daily.  ? celecoxib (CELEBREX) 200 MG capsule Take 1 capsule (200 mg total) by mouth daily. With food  ? Cholecalciferol (VITAMIN D3) 5000 UNITS CAPS Take 5,000 Units by mouth daily.   ? dicyclomine (BENTYL) 20 MG tablet Take 1 tablet (20 mg total) by mouth 3 (three) times daily before meals.  ? fexofenadine (ALLEGRA) 180 MG tablet Take 1 tablet (180 mg total) by mouth daily. For allergy symptoms  ? fluticasone furoate-vilanterol (BREO ELLIPTA) 200-25 MCG/ACT AEPB Inhale 1 puff into the lungs daily.  ? Magnesium Oxide (MAG-OX PO) Take 1 tablet by mouth daily.   ? Melatonin 10 MG TABS Take 70 mg by mouth.  ? nebivolol (BYSTOLIC) 10 MG  tablet Take 1 tablet (10 mg total) by mouth daily. (Needs to be seen before next refill)  ? ondansetron (ZOFRAN) 4 MG tablet Take 1 tablet (4 mg total) by mouth every 8 (eight) hours as needed for nausea or vomiting.  ? Psyllium 30.9 % POWD Drink 1 tablespoon dissolved in water, twice Daily  ? rizatriptan (MAXALT-MLT) 10 MG disintegrating tablet TAKE 1 TABLET BY MOUTH AS NEEDED FOR migraine MAY REPEAT in TWO hours if neeeded  ? sildenafil (VIAGRA) 100 MG tablet Take 1 tablet (100 mg total) by mouth daily as needed for erectile dysfunction.  ? Testosterone 30 MG/ACT SOLN APPPLY TWO PUMPS TO EACH TO THE ARM ONCE DAILY  ? traZODone (DESYREL) 150 MG tablet TAKE 1/3 TO ONE TABLET BY MOUTH NIGHTLY FOR SLEEP  ? [DISCONTINUED] esomeprazole (NEXIUM 24HR) 20 MG capsule Take two capsules twice a day on an empty stomach  ? esomeprazole (NEXIUM 24HR) 20 MG capsule Take two capsules twice a day on an empty stomach  ? [DISCONTINUED] diphenhydrAMINE (BENADRYL) 25 mg capsule Take 25 mg by mouth every 6 (six) hours as needed.  ? ?No facility-administered encounter medications on file as of 04/18/2021.  ? ? ?Allergies  ?Allergen Reactions  ? Diovan [Valsartan] Swelling  ? Quinapril Hcl Swelling  ? Quinapril Hcl   ? ? ?Review of Systems  ?Constitutional:  Positive for fatigue. Negative for activity change, appetite change, chills, diaphoresis, fever and unexpected weight change.  ?Respiratory:  Negative for cough and shortness of breath.   ?Cardiovascular:  Negative for chest pain, palpitations and leg swelling.  ?Gastrointestinal:  Positive for abdominal pain, nausea and vomiting. Negative for abdominal distention, anal bleeding, blood in stool, constipation, diarrhea and rectal pain.  ?Endocrine: Negative for cold intolerance, heat intolerance, polydipsia, polyphagia and polyuria.  ?Genitourinary:  Negative for decreased urine volume and difficulty urinating.  ?Musculoskeletal:  Positive for arthralgias (chronic).  ?Neurological:   Positive for headaches. Negative for dizziness, tremors, seizures, syncope, facial asymmetry, speech difficulty, weakness, light-headedness and numbness.  ?Psychiatric/Behavioral:  Negative for confusion.   ?All other systems reviewed and are negative. ? ?   ? ?Objective:  ?BP 121/71   Pulse 64   Temp 98.2 ?F (36.8 ?C) (Temporal)   Ht '5\' 9"'$  (1.753 m)   Wt 258 lb 4 oz (117.1 kg)   BMI 38.14 kg/m?   ? ?Wt Readings from Last 3 Encounters:  ?04/18/21 258 lb 4 oz (117.1 kg)  ?02/17/21 252 lb (114.3 kg)  ?02/14/21 252 lb 9.6 oz (114.6 kg)  ? ? ?Physical Exam ?Vitals and nursing note reviewed.  ?Constitutional:   ?  General: He is not in acute distress. ?   Appearance: Normal appearance. He is well-developed and well-groomed. He is obese. He is not ill-appearing, toxic-appearing or diaphoretic.  ?HENT:  ?   Head: Normocephalic and atraumatic.  ?   Jaw: There is normal jaw occlusion.  ?   Right Ear: Hearing normal.  ?   Left Ear: Hearing normal.  ?   Nose: Nose normal.  ?   Mouth/Throat:  ?   Lips: Pink.  ?   Mouth: Mucous membranes are moist.  ?   Pharynx: Oropharynx is clear. Uvula midline.  ?Eyes:  ?   General: Lids are normal.  ?   Extraocular Movements: Extraocular movements intact.  ?   Conjunctiva/sclera: Conjunctivae normal.  ?   Pupils: Pupils are equal, round, and reactive to light.  ?Neck:  ?   Thyroid: No thyroid mass, thyromegaly or thyroid tenderness.  ?   Vascular: No carotid bruit or JVD.  ?   Trachea: Trachea and phonation normal.  ?Cardiovascular:  ?   Rate and Rhythm: Normal rate and regular rhythm.  ?   Chest Wall: PMI is not displaced.  ?   Pulses: Normal pulses.  ?   Heart sounds: Normal heart sounds. No murmur heard. ?  No friction rub. No gallop.  ?Pulmonary:  ?   Effort: Pulmonary effort is normal. No respiratory distress.  ?   Breath sounds: Normal breath sounds. No wheezing.  ?Abdominal:  ?   General: Abdomen is protuberant. Bowel sounds are normal. There is no distension or abdominal bruit.  There are no signs of injury.  ?   Palpations: Abdomen is soft. There is no hepatomegaly or splenomegaly.  ?   Tenderness: There is no abdominal tenderness. There is no right CVA tenderness, left CVA tenderness

## 2021-04-19 ENCOUNTER — Other Ambulatory Visit: Payer: Self-pay | Admitting: Family Medicine

## 2021-04-19 LAB — CBC WITH DIFFERENTIAL/PLATELET
Basophils Absolute: 0.1 10*3/uL (ref 0.0–0.2)
Basos: 1 %
EOS (ABSOLUTE): 0.1 10*3/uL (ref 0.0–0.4)
Eos: 2 %
Hematocrit: 45.5 % (ref 37.5–51.0)
Hemoglobin: 15.7 g/dL (ref 13.0–17.7)
Immature Grans (Abs): 0 10*3/uL (ref 0.0–0.1)
Immature Granulocytes: 0 %
Lymphocytes Absolute: 1.4 10*3/uL (ref 0.7–3.1)
Lymphs: 30 %
MCH: 29.5 pg (ref 26.6–33.0)
MCHC: 34.5 g/dL (ref 31.5–35.7)
MCV: 86 fL (ref 79–97)
Monocytes Absolute: 0.3 10*3/uL (ref 0.1–0.9)
Monocytes: 6 %
Neutrophils Absolute: 2.9 10*3/uL (ref 1.4–7.0)
Neutrophils: 61 %
Platelets: 129 10*3/uL — ABNORMAL LOW (ref 150–450)
RBC: 5.32 x10E6/uL (ref 4.14–5.80)
RDW: 11.9 % (ref 11.6–15.4)
WBC: 4.7 10*3/uL (ref 3.4–10.8)

## 2021-04-19 LAB — CMP14+EGFR
ALT: 29 IU/L (ref 0–44)
AST: 20 IU/L (ref 0–40)
Albumin/Globulin Ratio: 2.3 — ABNORMAL HIGH (ref 1.2–2.2)
Albumin: 4.4 g/dL (ref 3.8–4.9)
Alkaline Phosphatase: 60 IU/L (ref 44–121)
BUN/Creatinine Ratio: 15 (ref 9–20)
BUN: 18 mg/dL (ref 6–24)
Bilirubin Total: 0.4 mg/dL (ref 0.0–1.2)
CO2: 26 mmol/L (ref 20–29)
Calcium: 9.5 mg/dL (ref 8.7–10.2)
Chloride: 103 mmol/L (ref 96–106)
Creatinine, Ser: 1.24 mg/dL (ref 0.76–1.27)
Globulin, Total: 1.9 g/dL (ref 1.5–4.5)
Glucose: 100 mg/dL — ABNORMAL HIGH (ref 70–99)
Potassium: 5 mmol/L (ref 3.5–5.2)
Sodium: 141 mmol/L (ref 134–144)
Total Protein: 6.3 g/dL (ref 6.0–8.5)
eGFR: 70 mL/min/{1.73_m2} (ref >59)

## 2021-04-19 LAB — THYROID PANEL WITH TSH
Free Thyroxine Index: 1.8 (ref 1.2–4.9)
T3 Uptake Ratio: 26 % (ref 24–39)
T4, Total: 7.1 ug/dL (ref 4.5–12.0)
TSH: 1.36 u[IU]/mL (ref 0.450–4.500)

## 2021-04-19 LAB — LIPASE: Lipase: 26 U/L (ref 13–78)

## 2021-04-19 LAB — VITAMIN D 25 HYDROXY (VIT D DEFICIENCY, FRACTURES): Vit D, 25-Hydroxy: 83 ng/mL (ref 30.0–100.0)

## 2021-04-19 LAB — AMYLASE: Amylase: 46 U/L (ref 31–110)

## 2021-04-22 ENCOUNTER — Other Ambulatory Visit: Payer: Self-pay | Admitting: Family Medicine

## 2021-04-22 NOTE — Telephone Encounter (Signed)
Last office visit 01/09/21 ?Upcoming appointment 07/28/21 ?Last refill 05/15/20, 180 ml, 5 refills ?

## 2021-04-24 ENCOUNTER — Other Ambulatory Visit: Payer: Self-pay | Admitting: *Deleted

## 2021-04-24 ENCOUNTER — Telehealth: Payer: Self-pay | Admitting: Family Medicine

## 2021-04-24 MED ORDER — NEBIVOLOL HCL 10 MG PO TABS: 10.0000 mg | ORAL_TABLET | Freq: Every day | ORAL | 2 refills | Status: DC

## 2021-05-02 ENCOUNTER — Ambulatory Visit (INDEPENDENT_AMBULATORY_CARE_PROVIDER_SITE_OTHER): Payer: No Typology Code available for payment source | Admitting: *Deleted

## 2021-05-02 DIAGNOSIS — Z23 Encounter for immunization: Secondary | ICD-10-CM | POA: Diagnosis not present

## 2021-07-24 ENCOUNTER — Other Ambulatory Visit: Payer: Self-pay | Admitting: Family Medicine

## 2021-07-28 ENCOUNTER — Ambulatory Visit: Payer: No Typology Code available for payment source | Admitting: Family Medicine

## 2021-07-28 ENCOUNTER — Encounter: Payer: Self-pay | Admitting: Family Medicine

## 2021-07-28 ENCOUNTER — Other Ambulatory Visit: Payer: Self-pay | Admitting: Family Medicine

## 2021-07-28 VITALS — BP 122/74 | HR 80 | Temp 98.0°F | Ht 69.0 in | Wt 259.4 lb

## 2021-07-28 DIAGNOSIS — I10 Essential (primary) hypertension: Secondary | ICD-10-CM

## 2021-07-28 DIAGNOSIS — R002 Palpitations: Secondary | ICD-10-CM | POA: Diagnosis not present

## 2021-07-28 DIAGNOSIS — G4733 Obstructive sleep apnea (adult) (pediatric): Secondary | ICD-10-CM

## 2021-07-28 DIAGNOSIS — R5381 Other malaise: Secondary | ICD-10-CM

## 2021-07-28 DIAGNOSIS — E782 Mixed hyperlipidemia: Secondary | ICD-10-CM | POA: Diagnosis not present

## 2021-07-28 DIAGNOSIS — R5383 Other fatigue: Secondary | ICD-10-CM

## 2021-07-28 DIAGNOSIS — R300 Dysuria: Secondary | ICD-10-CM

## 2021-07-28 LAB — URINALYSIS, COMPLETE
Bilirubin, UA: NEGATIVE
Glucose, UA: NEGATIVE
Ketones, UA: NEGATIVE
Leukocytes,UA: NEGATIVE
Nitrite, UA: NEGATIVE
Protein,UA: NEGATIVE
RBC, UA: NEGATIVE
Specific Gravity, UA: 1.02 (ref 1.005–1.030)
Urobilinogen, Ur: 0.2 mg/dL (ref 0.2–1.0)
pH, UA: 6.5 (ref 5.0–7.5)

## 2021-07-28 LAB — MICROSCOPIC EXAMINATION
Bacteria, UA: NONE SEEN
Epithelial Cells (non renal): NONE SEEN /hpf (ref 0–10)
RBC, Urine: NONE SEEN /hpf (ref 0–2)
Renal Epithel, UA: NONE SEEN /hpf

## 2021-07-28 MED ORDER — CELECOXIB 200 MG PO CAPS
200.0000 mg | ORAL_CAPSULE | Freq: Every day | ORAL | 5 refills | Status: DC
Start: 2021-07-28 — End: 2022-08-17

## 2021-07-28 MED ORDER — ALBUTEROL SULFATE HFA 108 (90 BASE) MCG/ACT IN AERS: 1.0000 | INHALATION_SPRAY | Freq: Four times a day (QID) | RESPIRATORY_TRACT | 3 refills | Status: AC | PRN

## 2021-07-28 MED ORDER — BELSOMRA 10 MG PO TABS: 10.0000 mg | ORAL_TABLET | Freq: Every evening | ORAL | 1 refills | Status: DC | PRN

## 2021-07-28 MED ORDER — DICYCLOMINE HCL 20 MG PO TABS
20.0000 mg | ORAL_TABLET | Freq: Three times a day (TID) | ORAL | 23 refills | Status: DC
Start: 2021-07-28 — End: 2022-08-17

## 2021-07-28 MED ORDER — TRAZODONE HCL 150 MG PO TABS: ORAL_TABLET | ORAL | 2 refills | Status: DC

## 2021-07-28 MED ORDER — NEBIVOLOL HCL 10 MG PO TABS: 10.0000 mg | ORAL_TABLET | Freq: Every day | ORAL | 3 refills | Status: DC

## 2021-07-28 MED ORDER — RIZATRIPTAN BENZOATE 10 MG PO TBDP: ORAL_TABLET | ORAL | 5 refills | Status: DC

## 2021-07-29 ENCOUNTER — Ambulatory Visit: Payer: No Typology Code available for payment source

## 2021-07-29 ENCOUNTER — Inpatient Hospital Stay: Payer: No Typology Code available for payment source

## 2021-07-29 DIAGNOSIS — R002 Palpitations: Secondary | ICD-10-CM

## 2021-07-29 LAB — CBC WITH DIFFERENTIAL/PLATELET
Basophils Absolute: 0.1 10*3/uL (ref 0.0–0.2)
Basos: 1 %
EOS (ABSOLUTE): 0.1 10*3/uL (ref 0.0–0.4)
Eos: 2 %
Hematocrit: 45.4 % (ref 37.5–51.0)
Hemoglobin: 15.1 g/dL (ref 13.0–17.7)
Immature Grans (Abs): 0 10*3/uL (ref 0.0–0.1)
Immature Granulocytes: 0 %
Lymphocytes Absolute: 1.5 10*3/uL (ref 0.7–3.1)
Lymphs: 31 %
MCH: 28.8 pg (ref 26.6–33.0)
MCHC: 33.3 g/dL (ref 31.5–35.7)
MCV: 87 fL (ref 79–97)
Monocytes Absolute: 0.3 10*3/uL (ref 0.1–0.9)
Monocytes: 6 %
Neutrophils Absolute: 2.7 10*3/uL (ref 1.4–7.0)
Neutrophils: 60 %
Platelets: 123 10*3/uL — ABNORMAL LOW (ref 150–450)
RBC: 5.24 x10E6/uL (ref 4.14–5.80)
RDW: 12.7 % (ref 11.6–15.4)
WBC: 4.6 10*3/uL (ref 3.4–10.8)

## 2021-07-29 LAB — LIPID PANEL
Chol/HDL Ratio: 4.2 ratio (ref 0.0–5.0)
Cholesterol, Total: 142 mg/dL (ref 100–199)
HDL: 34 mg/dL — ABNORMAL LOW (ref >39)
LDL Chol Calc (NIH): 89 mg/dL (ref 0–99)
Triglycerides: 102 mg/dL (ref 0–149)
VLDL Cholesterol Cal: 19 mg/dL (ref 5–40)

## 2021-07-29 LAB — CMP14+EGFR
ALT: 35 IU/L (ref 0–44)
AST: 20 IU/L (ref 0–40)
Albumin/Globulin Ratio: 2.7 — ABNORMAL HIGH (ref 1.2–2.2)
Albumin: 4.3 g/dL (ref 3.8–4.9)
Alkaline Phosphatase: 54 IU/L (ref 44–121)
BUN/Creatinine Ratio: 18 (ref 9–20)
BUN: 20 mg/dL (ref 6–24)
Bilirubin Total: 0.3 mg/dL (ref 0.0–1.2)
CO2: 24 mmol/L (ref 20–29)
Calcium: 9.3 mg/dL (ref 8.7–10.2)
Chloride: 105 mmol/L (ref 96–106)
Creatinine, Ser: 1.13 mg/dL (ref 0.76–1.27)
Globulin, Total: 1.6 g/dL (ref 1.5–4.5)
Glucose: 109 mg/dL — ABNORMAL HIGH (ref 70–99)
Potassium: 4.8 mmol/L (ref 3.5–5.2)
Sodium: 142 mmol/L (ref 134–144)
Total Protein: 5.9 g/dL — ABNORMAL LOW (ref 6.0–8.5)
eGFR: 78 mL/min/{1.73_m2} (ref >59)

## 2021-07-29 LAB — VITAMIN B12: Vitamin B-12: 611 pg/mL (ref 232–1245)

## 2021-07-29 NOTE — Progress Notes (Signed)
Heart monitor was applied and instructions were given on usage and returning monitor.  Patient voiced understanding.

## 2021-08-11 ENCOUNTER — Encounter: Payer: Self-pay | Admitting: Cardiology

## 2021-08-11 DIAGNOSIS — R002 Palpitations: Secondary | ICD-10-CM | POA: Insufficient documentation

## 2021-08-11 NOTE — Progress Notes (Unsigned)
Cardiology Office Note   Date:  08/13/2021   ID:  Roy Lawrence [birth year], MRN [id number]  PCP:  Claretta Fraise, MD  Cardiologist:   Minus Breeding, MD Referring:  Claretta Fraise, MD   Chief Complaint  Patient presents with   Palpitations   Chest Pain      History of Present Illness: Roy Lawrence is a 51 y.o. male who presents for evaluation of palpitations and chest discomfort.  He is referred by Claretta Fraise, MD .  I have seen the patient in the distant past.  He has had previous evaluation for chest discomfort with a negative POET (Plain Old Exercise Treadmill) in 2017.  In 2016 he had an echo that was unremarkable.  He says that for the last month or so he has had decreased exercise tolerance.  Has had increased fatigue.  He says he is just walking across [employer] working works and going up some stairs he feels he can stop to rest.  Leg weakness.  He has some increased shortness of breath.  Has had some discomfort with swallowing chest tightness.  He is still goes away after he rests.  Has not had any resting shortness of breath, PND orthopnea.  He does describe palpitations.  He shows me his watch and his heart rate does go into the 110s  for brief periods although he does not have an EKG.  He did wear a monitor for 10 days but I don't have these results.  He does not have a trigger for the rate going up.  He says it goes away spontaneously.  He does not do regular but just rapid.  This again has been for the last 4 weeks.  I do see the blood work was done to include thyroid earlier this year.  Electrolytes were normal.  He is not anemic.   Past Medical History:  Diagnosis Date   Allergy    SEASONAL   Anxiety    Asthma    Depression    Diverticulitis    Diverticulosis    Esophageal stricture    GERD (gastroesophageal reflux disease)    Hypertension    Prehypertension   Obesity    Sleep apnea    Spondylosis     Past Surgical History:  Procedure  Laterality Date   ESOPHAGEAL DILATION     FINGER SURGERY Right    3rd digit; imbedded glass   SHOULDER SURGERY Right    x 2     Current Outpatient Medications  Medication Sig Dispense Refill   acetaminophen (TYLENOL) 325 MG tablet Take 650 mg by mouth every 6 (six) hours as needed for pain.     albuterol (VENTOLIN HFA) 108 (90 Base) MCG/ACT inhaler Inhale 1 puff into the lungs every 6 (six) hours as needed for wheezing. 18 each 3   b complex vitamins tablet Take 1 tablet by mouth daily.     celecoxib (CELEBREX) 200 MG capsule Take 1 capsule (200 mg total) by mouth daily. With food (Patient taking differently: Take 200 mg by mouth as needed. With food) 30 capsule 5   Cholecalciferol (VITAMIN D3) 5000 UNITS CAPS Take 5,000 Units by mouth 2 (two) times daily.     dicyclomine (BENTYL) 20 MG tablet Take 1 tablet (20 mg total) by mouth 3 (three) times daily before meals. 180 tablet 23   esomeprazole (NEXIUM 24HR) 20 MG capsule Take two capsules twice a day on an empty stomach 360 capsule 3  fexofenadine (ALLEGRA) 180 MG tablet Take 1 tablet (180 mg total) by mouth daily. For allergy symptoms (Patient taking differently: Take 180 mg by mouth as needed. For allergy symptoms) 30 tablet 11   fluticasone furoate-vilanterol (BREO ELLIPTA) 200-25 MCG/ACT AEPB Inhale 1 puff into the lungs daily. 1 each 11   Magnesium Oxide (MAG-OX PO) Take 1 tablet by mouth daily.      metoprolol tartrate (LOPRESSOR) 100 MG tablet Take 1 tablet (100 mg total) by mouth as directed. Take 1 tablet 2 hours before your CT scan 1 tablet 0   nebivolol (BYSTOLIC) 10 MG tablet Take 1 tablet (10 mg total) by mouth daily. 90 tablet 3   ondansetron (ZOFRAN) 4 MG tablet Take 1 tablet (4 mg total) by mouth every 8 (eight) hours as needed for nausea or vomiting. 20 tablet 0   Psyllium 30.9 % POWD Drink 1 tablespoon dissolved in water, twice Daily  11   rizatriptan (MAXALT-MLT) 10 MG disintegrating tablet TAKE 1 TABLET BY MOUTH AS  NEEDED FOR migraine MAY REPEAT in TWO hours if neeeded 10 tablet 5   sildenafil (VIAGRA) 100 MG tablet Take 1 tablet (100 mg total) by mouth daily as needed for erectile dysfunction. 10 tablet 5   Suvorexant (BELSOMRA) 10 MG TABS Take 10 mg by mouth at bedtime as needed. 30 tablet 1   Testosterone 30 MG/ACT SOLN APPPLY TWO PUMPS TO EACH ARM ONCE DAILY 180 mL 5   traZODone (DESYREL) 150 MG tablet Take 150 mg by mouth at bedtime.     No current facility-administered medications for this visit.    Allergies:   Diovan [valsartan], Quinapril hcl, and Quinapril hcl    Social History:  The patient  reports that he has never smoked. He has never used smokeless tobacco. He reports current alcohol use. He reports that he does not use drugs.   Family History:  The patient's family history includes Asthma in his mother; Cancer in his father; Dementia in his maternal grandfather and maternal grandmother; Diabetes in his father; Diverticulitis in his mother; GER disease in his mother; Berenice Primas' disease in his father; Hodgkin's lymphoma in his father; Hyperlipidemia in his mother; Hypertension in his maternal grandfather, maternal grandmother, and mother; Liver cancer in his father; Thyroid disease in his father and sister.    ROS:  Please see the history of present illness.   Otherwise, review of systems are positive for none.   All other systems are reviewed and negative.    PHYSICAL EXAM: VS:  BP 118/80   Pulse 68   Ht '5\' 9"'$  (1.753 m)   Wt 259 lb (117.5 kg)   BMI 38.25 kg/m  , BMI Body mass index is 38.25 kg/m. GENERAL:  Well appearing HEENT:  Pupils equal round and reactive, fundi not visualized, oral mucosa unremarkable NECK:  No jugular venous distention, waveform within normal limits, carotid upstroke brisk and symmetric, no bruits, no thyromegaly LYMPHATICS:  No cervical, inguinal adenopathy LUNGS:  Clear to auscultation bilaterally BACK:  No CVA tenderness CHEST:  Unremarkable HEART:  PMI  not displaced or sustained,S1 and S2 within normal limits, no S3, no S4, no clicks, no rubs, no murmurs ABD:  Flat, positive bowel sounds normal in frequency in pitch, no bruits, no rebound, no guarding, no midline pulsatile mass, no hepatomegaly, no splenomegaly EXT:  2 plus pulses throughout, no edema, no cyanosis no clubbing SKIN:  No rashes no nodules NEURO:  Cranial nerves II through XII grossly intact, motor grossly intact  throughout [facility] [place]:  Cognitively intact, oriented to person place and time    EKG:  EKG is ordered today. The ekg ordered today demonstrates sinus rhythm, rate 68, axis within normal limits, intervals within normal limits, no acute ST-T wave changes.   Recent Labs: 04/18/2021: TSH 1.360 07/28/2021: ALT 35; BUN 20; Creatinine, Ser 1.13; Hemoglobin 15.1; Platelets 123; Potassium 4.8; Sodium 142    Lipid Panel    Component Value Date/Time   CHOL 142 07/28/2021 0903   CHOL 132 05/04/2012 1053   TRIG 102 07/28/2021 0903   TRIG 170 (H) 09/13/2015 0923   TRIG 111 05/04/2012 1053   HDL 34 (L) 07/28/2021 0903   HDL 34 (L) 09/13/2015 0923   HDL 38 (L) 05/04/2012 1053   CHOLHDL 4.2 07/28/2021 0903   LDLCALC 89 07/28/2021 0903   LDLCALC 95 06/19/2013 1053   LDLCALC 72 05/04/2012 1053      Wt Readings from Last 3 Encounters:  08/13/21 259 lb (117.5 kg)  07/28/21 259 lb 6.4 oz (117.7 kg)  04/18/21 258 lb 4 oz (117.1 kg)      Other studies Reviewed: Additional studies/ records that were reviewed today include: Labs. Review of the above records demonstrates:  Please see elsewhere in the note.     ASSESSMENT AND PLAN:  Palpitations: I will wait to see the results of his monitor.  We can then discuss therapy versus conservative management.  Chest discomfort: He has some decreased exercise tolerance and chest discomfort.  The pretest probability of obstructive coronary disease is moderate.  Coronary CTA is indicated.  Sleep apnea: I encouraged him to get  follow-up with Dr. Halford Chessman as he has not use CPAP in a few years.  Obesity:  We talked about weight loss with diet and exercise.   Current medicines are reviewed at length with the patient today.  The patient does not have concerns regarding medicines.  The following changes have been made:  no change  Labs/ tests ordered today include:   Orders Placed This Encounter  Procedures   CT CORONARY MORPH W/CTA COR W/SCORE W/CA W/CM &/OR WO/CM   EKG 12-Lead     Disposition:   FU with me based on the results of the above   Signed, Minus Breeding, MD  08/13/2021 11:46 AM    [place]

## 2021-08-13 ENCOUNTER — Encounter: Payer: Self-pay | Admitting: Cardiology

## 2021-08-13 ENCOUNTER — Ambulatory Visit (INDEPENDENT_AMBULATORY_CARE_PROVIDER_SITE_OTHER): Payer: No Typology Code available for payment source | Admitting: Cardiology

## 2021-08-13 VITALS — BP 118/80 | HR 68 | Ht 69.0 in | Wt 259.0 lb

## 2021-08-13 DIAGNOSIS — I10 Essential (primary) hypertension: Secondary | ICD-10-CM | POA: Diagnosis not present

## 2021-08-13 DIAGNOSIS — R002 Palpitations: Secondary | ICD-10-CM | POA: Diagnosis not present

## 2021-08-13 DIAGNOSIS — R072 Precordial pain: Secondary | ICD-10-CM | POA: Diagnosis not present

## 2021-08-13 MED ORDER — METOPROLOL TARTRATE 100 MG PO TABS
100.0000 mg | ORAL_TABLET | ORAL | 0 refills | Status: DC
Start: 2021-08-13 — End: 2021-09-11

## 2021-08-13 NOTE — Patient Instructions (Signed)
Medication Instructions:  The current medical regimen is effective;  continue present plan and medications.  *If you need a refill on your cardiac medications before your next appointment, please call your pharmacy*  Lab Work: None today If you have labs (blood work) drawn today and your tests are completely normal, you will receive your results only by: [place] (if you have MyChart) OR A paper copy in the mail If you have any lab test that is abnormal or we need to change your treatment, we will call you to review the results.  Testing/Procedures:   Your cardiac CT will be scheduled at:   [facility] [address] [place], [place] [address] [phone]  [place] arrive at the [facility] and Children's Entrance (Entrance C2) of [facility] 30 minutes prior to test start time. You can use the FREE valet parking offered at entrance C (encouraged to control the heart rate for the test)  Proceed to the [facility] Radiology Department (first floor) to check-in and test prep.  All radiology patients and guests should use entrance C2 at [facility], accessed from [facility], even though the hospital's physical address listed is [address]     Please follow these instructions carefully (unless otherwise directed):  Hold all erectile dysfunction medications at least 3 days (72 hrs) prior to test.  On the Night Before the Test: Be sure to Drink plenty of water. Do not consume any caffeinated/decaffeinated beverages or chocolate 12 hours prior to your test. Do not take any antihistamines 12 hours prior to your test.  On the Day of the Test: Drink plenty of water until 1 hour prior to the test. Do not eat any food 4 hours prior to the test. You may take your regular medications prior to the test.  Take metoprolol (Lopressor) two hours prior to test. HOLD Furosemide/Hydrochlorothiazide morning of the test.      After the  Test: Drink plenty of water. After receiving IV contrast, you may experience a mild flushed feeling. This is normal. On occasion, you may experience a mild rash up to 24 hours after the test. This is not dangerous. If this occurs, you can take Benadryl 25 mg and increase your fluid intake. If you experience trouble breathing, this can be serious. If it is severe call 911 IMMEDIATELY. If it is mild, please call our office.  We will call to schedule your test 2-4 weeks out understanding that some insurance companies will need an authorization prior to the service being performed.   For non-scheduling related questions, please contact the cardiac imaging nurse navigator should you have any questions/concerns: Marchia Bond, Cardiac Imaging Nurse Navigator Gordy Clement, Cardiac Imaging Nurse Navigator [facility] Direct Office Dial: [phone]   For scheduling needs, including cancellations and rescheduling, please call [place], [phone].  Follow-Up: At [facility], you and your health needs are our priority.  As part of our continuing mission to provide you with exceptional heart care, we have created designated Provider Care Teams.  These Care Teams include your primary Cardiologist (physician) and Advanced Practice Providers (APPs -  Physician Assistants and Nurse Practitioners) who all work together to provide you with the care you need, when you need it.  We recommend signing up for the patient portal called "MyChart".  Sign up information is provided on this After Visit Summary.  MyChart is used to connect with patients for Virtual Visits (Telemedicine).  Patients are  able to view lab/test results, encounter notes, upcoming appointments, etc.  Non-urgent messages can be sent to your provider as well.   To learn more about what you can do with MyChart, go to [email or web address].    Your next appointment:   Will be determined on the results of the  above testing.  Important Information About Sugar

## 2021-08-19 ENCOUNTER — Encounter: Payer: Self-pay | Admitting: Family Medicine

## 2021-09-02 ENCOUNTER — Other Ambulatory Visit: Payer: Self-pay | Admitting: Internal Medicine

## 2021-09-08 ENCOUNTER — Telehealth (HOSPITAL_COMMUNITY): Payer: Self-pay | Admitting: Emergency Medicine

## 2021-09-08 NOTE — Telephone Encounter (Signed)
Reaching out to patient to offer assistance regarding upcoming cardiac imaging study; pt verbalizes understanding of appt date/time, parking situation and where to check in, pre-test NPO status and medications ordered, and verified current allergies; name and call back number provided for further questions should they arise Roy Bond RN Navigator Cardiac Imaging [facility] [phone] office [phone] cell  Denies iv issues Arrival 900 w/c entrance '100mg'$  metoprolol tart , holding bystolic, allergy meds, inhaler, viagra Aware of nitro

## 2021-09-09 ENCOUNTER — Ambulatory Visit (HOSPITAL_COMMUNITY)
Admission: RE | Admit: 2021-09-09 | Discharge: 2021-09-09 | Disposition: A | Discharge status: Home / Self Care | Payer: No Typology Code available for payment source | Source: Ambulatory Visit | Attending: Cardiology | Admitting: Cardiology

## 2021-09-09 DIAGNOSIS — I10 Essential (primary) hypertension: Secondary | ICD-10-CM | POA: Diagnosis present

## 2021-09-09 DIAGNOSIS — R072 Precordial pain: Secondary | ICD-10-CM | POA: Insufficient documentation

## 2021-09-09 DIAGNOSIS — R002 Palpitations: Secondary | ICD-10-CM | POA: Diagnosis present

## 2021-09-09 MED ORDER — IOHEXOL 350 MG/ML SOLN
100.0000 mL | Freq: Once | INTRAVENOUS | Status: AC | PRN
  Administered 2021-09-09: 100 mL via INTRAVENOUS

## 2021-09-09 MED ORDER — NITROGLYCERIN 0.4 MG SL SUBL
SUBLINGUAL_TABLET | SUBLINGUAL | Status: AC
  Filled 2021-09-09: qty 2

## 2021-09-09 MED ORDER — NITROGLYCERIN 0.4 MG SL SUBL
0.8000 mg | SUBLINGUAL_TABLET | Freq: Once | SUBLINGUAL | Status: AC
  Administered 2021-09-09: 0.8 mg via SUBLINGUAL

## 2021-09-11 ENCOUNTER — Encounter: Payer: Self-pay | Admitting: Family Medicine

## 2021-09-11 ENCOUNTER — Ambulatory Visit: Payer: No Typology Code available for payment source | Admitting: Family Medicine

## 2021-09-11 VITALS — BP 115/75 | HR 73 | Temp 97.3°F | Ht 69.0 in | Wt 259.4 lb

## 2021-09-11 DIAGNOSIS — I471 Supraventricular tachycardia, unspecified: Secondary | ICD-10-CM

## 2021-09-11 DIAGNOSIS — Z6838 Body mass index (BMI) 38.0-38.9, adult: Secondary | ICD-10-CM

## 2021-09-11 DIAGNOSIS — E6609 Other obesity due to excess calories: Secondary | ICD-10-CM

## 2021-09-11 MED ORDER — SEMAGLUTIDE-WEIGHT MANAGEMENT 0.5 MG/0.5ML ~~LOC~~ SOAJ: SUBCUTANEOUS | Status: DC

## 2021-09-11 MED ORDER — METOPROLOL SUCCINATE ER 50 MG PO TB24: 50.0000 mg | ORAL_TABLET | Freq: Every day | ORAL | 2 refills | Status: DC

## 2021-09-11 NOTE — Progress Notes (Signed)
Subjective:  Patient ID: Roy Lawrence, male    DOB: Apr 18, 1970  Age: 50 y.o. MRN: [id number]  CC: Follow-up   HPI Roy Lawrence presents for recheck of palpitations. Still having racing and tightness. Sometimes has to sit down and take a break. Occurring daily lasting a few minutes.Pt. has at least 2 cups coffee and a soft drink daily.      09/11/2021    8:07 AM 09/11/2021    8:04 AM 07/28/2021    8:03 AM  Depression screen PHQ 2/9  Decreased Interest 0 0 0  Down, Depressed, Hopeless 0 0 0  PHQ - 2 Score 0 0 0  Altered sleeping 2    Tired, decreased energy 3    Change in appetite 0    Feeling bad or failure about yourself  0    Trouble concentrating 0    Moving slowly or fidgety/restless 1    Suicidal thoughts 0    PHQ-9 Score 6    Difficult doing work/chores Not difficult at all      History Roy Lawrence has a past medical history of Allergy, Anxiety, Asthma, Depression, Diverticulitis, Diverticulosis, Esophageal stricture, GERD (gastroesophageal reflux disease), Hypertension, Obesity, Sleep apnea, and Spondylosis.   He has a past surgical history that includes Shoulder surgery (Right); Finger surgery (Right); and Esophageal dilation.   His family history includes Asthma in his mother; Cancer in his father; Dementia in his maternal grandfather and maternal grandmother; Diabetes in his father; Diverticulitis in his mother; GER disease in his mother; Berenice Primas' disease in his father; Hodgkin's lymphoma in his father; Hyperlipidemia in his mother; Hypertension in his maternal grandfather, maternal grandmother, and mother; Liver cancer in his father; Thyroid disease in his father and sister.He reports that he has never smoked. He has never used smokeless tobacco. He reports current alcohol use. He reports that he does not use drugs.    ROS Review of Systems  Constitutional: Negative.  Negative for fever.  HENT: Negative.    Eyes:  Negative for visual disturbance.  Respiratory:   Negative for cough and shortness of breath.   Cardiovascular:  Negative for chest pain and leg swelling.  Gastrointestinal:  Negative for abdominal pain, diarrhea, nausea and vomiting.  Genitourinary:  Negative for difficulty urinating.  Musculoskeletal:  Negative for arthralgias and myalgias.  Skin:  Negative for rash.  Neurological:  Negative for headaches.  Psychiatric/Behavioral:  Negative for sleep disturbance.     Objective:  BP 115/75   Pulse 73   Temp (!) 97.3 F (36.3 C)   Ht '5\' 9"'$  (1.753 m)   Wt 259 lb 6.4 oz (117.7 kg)   SpO2 96%   BMI 38.31 kg/m   BP Readings from Last 3 Encounters:  09/11/21 115/75  09/09/21 124/67  08/13/21 118/80    Wt Readings from Last 3 Encounters:  09/11/21 259 lb 6.4 oz (117.7 kg)  08/13/21 259 lb (117.5 kg)  07/28/21 259 lb 6.4 oz (117.7 kg)     Physical Exam Vitals reviewed.  Constitutional:      Appearance: He is well-developed.  HENT:     Head: Normocephalic and atraumatic.     Right Ear: External ear normal.     Left Ear: External ear normal.     Mouth/Throat:     Pharynx: No oropharyngeal exudate or posterior oropharyngeal erythema.  Eyes:     Pupils: Pupils are equal, round, and reactive to light.  Cardiovascular:     Rate and Rhythm: Normal rate  and regular rhythm.     Heart sounds: No murmur heard. Pulmonary:     Effort: Pulmonary effort is normal. No respiratory distress.  Musculoskeletal:     Cervical back: Normal range of motion and neck supple.  Neurological:     Mental Status: He is alert and oriented to person, place, and time.       Assessment & Plan:   Roy Lawrence was seen today for follow-up.  Diagnoses and all orders for this visit:  SVT (supraventricular tachycardia) (HCC)  Class 2 obesity due to excess calories without serious comorbidity with body mass index (BMI) of 38.0 to 38.9 in adult  Other orders -     metoprolol succinate (TOPROL-XL) 50 MG 24 hr tablet; Take 1 tablet (50 mg total) by  mouth daily. For blood pressure control -     Semaglutide-Weight Management 0.5 MG/0.5ML SOAJ; Pt. Taking sublingual 5 West Progression Recent Vital Signs  '@VS'$ @   Past Medical History: No date: Allergy     Comment:  SEASONAL No date: Anxiety No date: Asthma No date: Depression No date: Diverticulitis No date: Diverticulosis No date: Esophageal stricture No date: GERD (gastroesophageal reflux disease) No date: Hypertension     Comment:  Prehypertension No date: Obesity No date: Sleep apnea No date: Spondylosis   Expected Discharge Date    Diet Order    None      VTE Documentation     Work Intensity Score/Level of Care    '@LEVELOFCARE'$ @   Mobility       Significant Events   DC Barriers  Abnormal Labs:  Claretta Fraise 09/11/2021, 9:42 AM   Daily, compounded at [place] [facility]       I have discontinued Leonie Green. Roy Lawrence's nebivolol and metoprolol tartrate. I am also having him start on metoprolol succinate and Semaglutide-Weight Management. Additionally, I am having him maintain his Magnesium Oxide (MAG-OX PO), Vitamin D3, acetaminophen, b complex vitamins, fexofenadine, Psyllium, fluticasone furoate-vilanterol, sildenafil, esomeprazole, ondansetron, Testosterone, celecoxib, Belsomra, rizatriptan, albuterol, dicyclomine, and traZODone.  Allergies as of 09/11/2021       Reactions   Diovan [valsartan] Swelling   Quinapril Hcl Swelling   Quinapril Hcl         Medication List        Accurate as of September 11, 2021  9:48 AM. If you have any questions, ask your nurse or doctor.          STOP taking these medications    metoprolol tartrate 100 MG tablet Commonly known as: LOPRESSOR Stopped by: Claretta Fraise, MD   nebivolol 10 MG tablet Commonly known as: Bystolic Stopped by: Claretta Fraise, MD       TAKE these medications    acetaminophen 325 MG tablet Commonly known as: TYLENOL Take 650 mg by mouth every 6 (six) hours as needed for pain.    albuterol 108 (90 Base) MCG/ACT inhaler Commonly known as: Ventolin HFA Inhale 1 puff into the lungs every 6 (six) hours as needed for wheezing.   b complex vitamins tablet Take 1 tablet by mouth daily.   Belsomra 10 MG Tabs Generic drug: Suvorexant Take 10 mg by mouth at bedtime as needed.   celecoxib 200 MG capsule Commonly known as: CeleBREX Take 1 capsule (200 mg total) by mouth daily. With food What changed:  when to take this reasons to take this   dicyclomine 20 MG tablet Commonly known as: BENTYL Take 1 tablet (20 mg total) by mouth 3 (three) times daily before meals.  esomeprazole 20 MG capsule Commonly known as: NexIUM 24HR Take two capsules twice a day on an empty stomach   fexofenadine 180 MG tablet Commonly known as: ALLEGRA Take 1 tablet (180 mg total) by mouth daily. For allergy symptoms What changed:  when to take this reasons to take this   fluticasone furoate-vilanterol 200-25 MCG/ACT Aepb Commonly known as: Breo Ellipta Inhale 1 puff into the lungs daily.   MAG-OX PO Take 1 tablet by mouth daily.   metoprolol succinate 50 MG 24 hr tablet Commonly known as: TOPROL-XL Take 1 tablet (50 mg total) by mouth daily. For blood pressure control Started by: Claretta Fraise, MD   ondansetron 4 MG tablet Commonly known as: Zofran Take 1 tablet (4 mg total) by mouth every 8 (eight) hours as needed for nausea or vomiting.   Psyllium 30.9 % Powd Drink 1 tablespoon dissolved in water, twice Daily   rizatriptan 10 MG disintegrating tablet Commonly known as: MAXALT-MLT TAKE 1 TABLET BY MOUTH AS NEEDED FOR migraine MAY REPEAT in TWO hours if neeeded   Semaglutide-Weight Management 0.5 MG/0.5ML Soaj Pt. Taking sublingual 5 [place] Progression Recent Vital Signs  '@VS'$ @   Past Medical History: No date: Allergy     Comment:  SEASONAL No date: Anxiety No date: Asthma No date: Depression No date: Diverticulitis No date: Diverticulosis No date: Esophageal  stricture No date: GERD (gastroesophageal reflux disease) No date: Hypertension     Comment:  Prehypertension No date: Obesity No date: Sleep apnea No date: Spondylosis   Expected Discharge Date    Diet Order    None      VTE Documentation     Work Intensity Score/Level of Care    '@LEVELOFCARE'$ @   Mobility       Significant Events   DC Barriers  Abnormal Labs:  Claretta Fraise 09/11/2021, 9:42 AM   Daily, compounded at [place] Started by: Claretta Fraise, MD   sildenafil 100 MG tablet Commonly known as: Viagra Take 1 tablet (100 mg total) by mouth daily as needed for erectile dysfunction.   Testosterone 30 MG/ACT Soln APPPLY TWO PUMPS TO EACH ARM ONCE DAILY   traZODone 150 MG tablet Commonly known as: DESYREL Take 150 mg by mouth at bedtime.   Vitamin D3 125 MCG (5000 UT) Caps Take 5,000 Units by mouth 2 (two) times daily.       Wants to try compounded semaglutide. Will fax to [facility]  Follow-up: Return in about 3 months (around 12/12/2021).  Claretta Fraise, M.D.

## 2021-09-12 ENCOUNTER — Encounter: Payer: Self-pay | Admitting: Cardiology

## 2021-09-18 ENCOUNTER — Other Ambulatory Visit: Payer: Self-pay | Admitting: Family Medicine

## 2021-10-01 ENCOUNTER — Ambulatory Visit: Payer: No Typology Code available for payment source | Admitting: Pulmonary Disease

## 2021-10-01 ENCOUNTER — Encounter: Payer: Self-pay | Admitting: Pulmonary Disease

## 2021-10-01 VITALS — BP 116/90 | HR 68 | Ht 69.0 in | Wt 261.0 lb

## 2021-10-01 DIAGNOSIS — F99 Mental disorder, not otherwise specified: Secondary | ICD-10-CM | POA: Diagnosis not present

## 2021-10-01 DIAGNOSIS — F5105 Insomnia due to other mental disorder: Secondary | ICD-10-CM

## 2021-10-01 DIAGNOSIS — R0683 Snoring: Secondary | ICD-10-CM | POA: Diagnosis not present

## 2021-10-01 DIAGNOSIS — G4726 Circadian rhythm sleep disorder, shift work type: Secondary | ICD-10-CM | POA: Diagnosis not present

## 2021-10-01 NOTE — Progress Notes (Signed)
[facility]  Chief Complaint  Patient presents with   Consult    Consult: Sleep apnea not using CPAP    Past Surgical History:  He  has a past surgical history that includes Shoulder surgery (Right); Finger surgery (Right); and Esophageal dilation.  Past Medical History:  Asthma, Allergies, HTN, Spondylosis, GERD, Esophageal stricture, Diverticulosis, Anxiety, Depression, PTSD  Constitutional:  BP (!) 116/90 (BP Location: Left Arm)   Pulse 68   Ht '5\' 9"'$  (1.753 m)   Wt 261 lb (118.4 kg)   SpO2 95%   BMI 38.54 kg/m   Brief Summary:  Roy Lawrence is a 51 y.o. male with obstructive sleep apnea.      Subjective:   I last saw him in 2017.  Home sleep study from 2016 showed moderate sleep apnea.  He went through a difficult divorce in 2018.  He wasn't able to keep up with CPAP during this process, and didn't restart.    He remarried.  His wife says he snores and stops breathing at night.  He will grind his teeth.  He is restless at night.  He naps on weekend, but still feels tired.  He goes to sleep at 1030 to 1130 pm.  He falls asleep after a while.  He wakes up 2 or 3 times to use the bathroom.  He gets out of bed at 630 am.  He feels okay in the morning, but gets tired as the day goes on.  He gets headaches later in the day.  He drinks 2 to 3 cups of coffee in the morning.  He works second shift and gets home at 10 pm.  He takes trazodone and belsomra at this time.  These help him fall asleep, but he is still waking up at night.  He denies sleep walking, sleep talking, bruxism, or nightmares.  There is no history of restless legs.  He denies sleep hallucinations, sleep paralysis, or cataplexy.  The Epworth score is 6 out of 24.   Physical Exam:   Appearance - well kempt   ENMT - no sinus tenderness, no oral exudate, no LAN, Mallampati 4 airway, no stridor  Respiratory - equal breath sounds bilaterally, no wheezing or  rales  CV - s1s2 regular rate and rhythm, no murmurs  Ext - no clubbing, no edema  Skin - no rashes  Psych - normal mood and affect   Pulmonary testing:    Chest Imaging:    Sleep Tests:  HST 05/18/14 >> AHI 15 Auto CPAP 12/04/15 to 01/02/16 >> used on 6 of 30 nights with average 7 hrs 31 min.  Average AHI 0.7 with median CPAP 8 and 95 th percentile CPAP 10 cm H2O    Cardiac Tests:  Echo 12/21/14 >> EF 55 to 60%, grade 1 DD  Social History:  He  reports that he has never smoked. He has never used smokeless tobacco. He reports current alcohol use. He reports that he does not use drugs.  Family History:  His family history includes Asthma in his mother; Cancer in his father; Dementia in his maternal grandfather and maternal grandmother; Diabetes in his father; Diverticulitis in his mother; GER disease in his mother; Berenice Primas' disease in his father; Hodgkin's lymphoma in his father; Hyperlipidemia in his mother; Hypertension in his maternal grandfather, maternal grandmother, and mother; Liver cancer in his father; Thyroid disease in his father and sister.    Discussion:  He has snoring, sleep  disruption, apnea, and daytime sleepiness.  He has history of hypertension and depression.  His BMI is > 35.  He has history of sleep apnea and I am concerned he still has significant sleep apnea.  Assessment/Plan:   Snoring with excessive daytime sleepiness. - will need to arrange for a home sleep study  Insomnia. - in setting of anxiety, depression, and PTSD - previously seen by behavioral health - trazodone, belsomra prescribed by his PCP  Shift work schedule. - he works 2nd shift  Obesity. - discussed how weight can impact sleep and risk for sleep disordered breathing - discussed options to assist with weight loss: combination of diet modification, cardiovascular and strength training exercises  Cardiovascular risk. - had an extensive discussion regarding the adverse health  consequences related to untreated sleep disordered breathing - specifically discussed the risks for hypertension, coronary artery disease, cardiac dysrhythmias, cerebrovascular disease, and diabetes - lifestyle modification discussed  Safe driving practices. - discussed how sleep disruption can increase risk of accidents, particularly when driving - safe driving practices were discussed  Therapies for obstructive sleep apnea. - if the sleep study shows significant sleep apnea, then various therapies for treatment were reviewed: CPAP, oral appliance, and surgical interventions  Time Spent Involved in Patient Care on Day of Examination:  37 minutes  Follow up:   Patient Instructions  Will arrange for home sleep study from the [facility] office Will call to arrange for follow up after sleep study reviewed   Medication List:   Allergies as of 10/01/2021       Reactions   Diovan [valsartan] Swelling   Quinapril Hcl Swelling   Quinapril Hcl         Medication List        Accurate as of October 01, 2021  9:36 AM. If you have any questions, ask your nurse or doctor.          acetaminophen 325 MG tablet Commonly known as: TYLENOL Take 650 mg by mouth every 6 (six) hours as needed for pain.   albuterol 108 (90 Base) MCG/ACT inhaler Commonly known as: Ventolin HFA Inhale 1 puff into the lungs every 6 (six) hours as needed for wheezing.   b complex vitamins tablet Take 1 tablet by mouth daily.   Belsomra 10 MG Tabs Generic drug: Suvorexant TAKE 1 TABLET BY MOUTH AT BEDTIME AS NEEDED   celecoxib 200 MG capsule Commonly known as: CeleBREX Take 1 capsule (200 mg total) by mouth daily. With food What changed:  when to take this reasons to take this   dicyclomine 20 MG tablet Commonly known as: BENTYL Take 1 tablet (20 mg total) by mouth 3 (three) times daily before meals.   esomeprazole 20 MG capsule Commonly known as: NexIUM 24HR Take two capsules twice a day on  an empty stomach   fexofenadine 180 MG tablet Commonly known as: ALLEGRA Take 1 tablet (180 mg total) by mouth daily. For allergy symptoms What changed:  when to take this reasons to take this   fluticasone furoate-vilanterol 200-25 MCG/ACT Aepb Commonly known as: Breo Ellipta Inhale 1 puff into the lungs daily.   MAG-OX PO Take 1 tablet by mouth daily.   metoprolol succinate 50 MG 24 hr tablet Commonly known as: TOPROL-XL Take 1 tablet (50 mg total) by mouth daily. For blood pressure control   ondansetron 4 MG tablet Commonly known as: Zofran Take 1 tablet (4 mg total) by mouth every 8 (eight) hours as needed for nausea or  vomiting.   Psyllium 30.9 % Powd Drink 1 tablespoon dissolved in water, twice Daily   rizatriptan 10 MG disintegrating tablet Commonly known as: MAXALT-MLT TAKE 1 TABLET BY MOUTH AS NEEDED FOR migraine MAY REPEAT in TWO hours if neeeded   Semaglutide-Weight Management 0.5 MG/0.5ML Soaj Pt. Taking sublingual 5 [place]gression Recent Vital Signs  '@VS'$ @   Past Medical History: No date: Allergy     Comment:  SEASONAL No date: Anxiety No date: Asthma No date: Depression No date: Diverticulitis No date: Diverticulosis No date: Esophageal stricture No date: GERD (gastroesophageal reflux disease) No date: Hypertension     Comment:  Prehypertension No date: Obesity No date: Sleep apnea No date: Spondylosis   Expected Discharge Date    Diet Order    None      VTE Documentation     Work Intensity Score/Level of Care    '@LEVELOFCARE'$ @   Mobility       Significant Events   DC Barriers  Abnormal Labs:  Claretta Fraise 09/11/2021, 9:42 AM   Daily, compounded at [facility]   sildenafil 100 MG tablet Commonly known as: Viagra Take 1 tablet (100 mg total) by mouth daily as needed for erectile dysfunction.   Testosterone 30 MG/ACT Soln APPPLY TWO PUMPS TO EACH ARM ONCE DAILY   traZODone 150 MG tablet Commonly known as:  DESYREL Take 150 mg by mouth at bedtime.   Vitamin D3 125 MCG (5000 UT) Caps Take 5,000 Units by mouth 2 (two) times daily.        Signature:  Chesley Mires, MD [place] Pager - [phone] 10/01/2021, 9:36 AM

## 2021-10-01 NOTE — Patient Instructions (Signed)
Will arrange for home sleep study from the [place] office Will call to arrange for follow up after sleep study reviewed

## 2021-11-13 ENCOUNTER — Ambulatory Visit (INDEPENDENT_AMBULATORY_CARE_PROVIDER_SITE_OTHER): Payer: No Typology Code available for payment source | Admitting: Family Medicine

## 2021-11-13 ENCOUNTER — Encounter: Payer: Self-pay | Admitting: Family Medicine

## 2021-11-13 VITALS — BP 104/67 | HR 68 | Temp 97.8°F | Ht 69.0 in | Wt 257.4 lb

## 2021-11-13 DIAGNOSIS — I10 Essential (primary) hypertension: Secondary | ICD-10-CM

## 2021-11-13 DIAGNOSIS — Z125 Encounter for screening for malignant neoplasm of prostate: Secondary | ICD-10-CM

## 2021-11-13 DIAGNOSIS — E559 Vitamin D deficiency, unspecified: Secondary | ICD-10-CM | POA: Diagnosis not present

## 2021-11-13 DIAGNOSIS — E782 Mixed hyperlipidemia: Secondary | ICD-10-CM

## 2021-11-13 DIAGNOSIS — Z23 Encounter for immunization: Secondary | ICD-10-CM

## 2021-11-13 DIAGNOSIS — E349 Endocrine disorder, unspecified: Secondary | ICD-10-CM

## 2021-11-13 DIAGNOSIS — Z0001 Encounter for general adult medical examination with abnormal findings: Secondary | ICD-10-CM

## 2021-11-13 DIAGNOSIS — Z0000 Encounter for general adult medical examination without abnormal findings: Secondary | ICD-10-CM

## 2021-11-13 DIAGNOSIS — D485 Neoplasm of uncertain behavior of skin: Secondary | ICD-10-CM

## 2021-11-13 LAB — URINALYSIS
Bilirubin, UA: NEGATIVE
Glucose, UA: NEGATIVE
Ketones, UA: NEGATIVE
Leukocytes,UA: NEGATIVE
Nitrite, UA: NEGATIVE
Protein,UA: NEGATIVE
RBC, UA: NEGATIVE
Specific Gravity, UA: 1.02 (ref 1.005–1.030)
Urobilinogen, Ur: 0.2 mg/dL (ref 0.2–1.0)
pH, UA: 6.5 (ref 5.0–7.5)

## 2021-11-13 MED ORDER — FEXOFENADINE HCL 180 MG PO TABS: 180.0000 mg | ORAL_TABLET | Freq: Every day | ORAL | 3 refills | Status: DC

## 2021-11-13 MED ORDER — SEMAGLUTIDE-WEIGHT MANAGEMENT 0.5 MG/0.5ML ~~LOC~~ SOAJ: SUBCUTANEOUS | Status: DC

## 2021-11-13 MED ORDER — NEBIVOLOL HCL 10 MG PO TABS: 10.0000 mg | ORAL_TABLET | Freq: Every day | ORAL | 3 refills | Status: DC

## 2021-11-13 NOTE — Progress Notes (Signed)
Subjective:  Patient ID: Roy Lawrence, male    DOB: 17-May-1970  Age: 51 y.o. MRN: [id number]  CC: Annual Exam   HPI Roy Lawrence presents for Annual exam. Went back to bystolic after seeing Dr. Percival Spanish for SVT.   Awakening in the night. Getting home sleep study next week. Having to use trazodone and Belsomra to help with sleep. Has used CPAP in the past but not for 5 years or more.   REcent increase in the semaglutide in order to improve weight loss outcome. Now using 1 ml sl daily per [place]     11/13/2021    8:14 AM 11/13/2021    8:04 AM 09/11/2021    8:07 AM  Depression screen PHQ 2/9  Decreased Interest 0 0 0  Down, Depressed, Hopeless 0 0 0  PHQ - 2 Score 0 0 0  Altered sleeping 2  2  Tired, decreased energy 2  3  Change in appetite 0  0  Feeling bad or failure about yourself  0  0  Trouble concentrating 1  0  Moving slowly or fidgety/restless 0  1  Suicidal thoughts 0  0  PHQ-9 Score 5  6  Difficult doing work/chores Not difficult at all  Not difficult at all    History Roy Lawrence has a past medical history of Allergy, Anxiety, Asthma, Depression, Diverticulitis, Diverticulosis, Esophageal stricture, GERD (gastroesophageal reflux disease), Hypertension, Obesity, Sleep apnea, and Spondylosis.   Roy Lawrence has a past surgical history that includes Shoulder surgery (Right); Finger surgery (Right); and Esophageal dilation.   His family history includes Asthma in his mother; Cancer in his father; Dementia in his maternal grandfather and maternal grandmother; Diabetes in his father; Diverticulitis in his mother; GER disease in his mother; Berenice Primas' disease in his father; Hodgkin's lymphoma in his father; Hyperlipidemia in his mother; Hypertension in his maternal grandfather, maternal grandmother, and mother; Liver cancer in his father; Thyroid disease in his father and sister.Roy Lawrence reports that Roy Lawrence has never smoked. Roy Lawrence has never used smokeless tobacco. Roy Lawrence reports current alcohol  use. Roy Lawrence reports that Roy Lawrence does not use drugs.    ROS Review of Systems  Constitutional:  Negative for activity change, fatigue and unexpected weight change.  HENT:  Negative for congestion, ear pain, hearing loss, postnasal drip and trouble swallowing.   Eyes:  Negative for pain and visual disturbance.  Respiratory:  Negative for cough, chest tightness and shortness of breath.   Cardiovascular:  Negative for chest pain, palpitations and leg swelling.  Gastrointestinal:  Negative for abdominal distention, abdominal pain, blood in stool, constipation, diarrhea, nausea and vomiting.  Endocrine: Negative for cold intolerance, heat intolerance and polydipsia.  Genitourinary:  Negative for difficulty urinating, dysuria, flank pain, frequency and urgency.  Musculoskeletal:  Negative for arthralgias and joint swelling.  Skin:  Negative for color change, rash and wound.  Neurological:  Negative for dizziness, syncope, speech difficulty, weakness, light-headedness, numbness and headaches.  Hematological:  Does not bruise/bleed easily.  Psychiatric/Behavioral:  Negative for confusion, decreased concentration, dysphoric mood and sleep disturbance. The patient is not nervous/anxious.     Objective:  BP 104/67   Pulse 68   Temp 97.8 F (36.6 C)   Ht 5' 9"  (1.753 m)   Wt 257 lb 6.4 oz (116.8 kg)   SpO2 96%   BMI 38.01 kg/m   BP Readings from Last 3 Encounters:  11/13/21 104/67  10/01/21 (!) 116/90  09/11/21 115/75    Wt Readings from Last  3 Encounters:  11/13/21 257 lb 6.4 oz (116.8 kg)  10/01/21 261 lb (118.4 kg)  09/11/21 259 lb 6.4 oz (117.7 kg)     Physical Exam Constitutional:      Appearance: Roy Lawrence is well-developed.  HENT:     Head: Normocephalic and atraumatic.  Eyes:     Pupils: Pupils are equal, round, and reactive to light.  Neck:     Thyroid: No thyromegaly.     Trachea: No tracheal deviation.  Cardiovascular:     Rate and Rhythm: Normal rate and regular rhythm.      Heart sounds: Normal heart sounds. No murmur heard.    No friction rub. No gallop.  Pulmonary:     Breath sounds: Normal breath sounds. No wheezing or rales.  Abdominal:     General: Bowel sounds are normal. There is no distension.     Palpations: Abdomen is soft. There is no mass.     Tenderness: There is no abdominal tenderness.     Hernia: There is no hernia in the left inguinal area.  Genitourinary:    Penis: Normal.      Testes: Normal.  Musculoskeletal:        General: Normal range of motion.     Cervical back: Normal range of motion.  Lymphadenopathy:     Cervical: No cervical adenopathy.  Skin:    General: Skin is warm and dry.  Neurological:     Mental Status: Roy Lawrence is alert and oriented to person, place, and time.       Assessment & Plan:   Roy Lawrence was seen today for annual exam.  Diagnoses and all orders for this visit:  Well adult exam -     CBC with Differential/Platelet -     CMP14+EGFR -     Lipid panel -     Urinalysis -     VITAMIN D 25 Hydroxy (Vit-D Deficiency, Fractures) -     PSA, total and free  Vitamin D deficiency -     VITAMIN D 25 Hydroxy (Vit-D Deficiency, Fractures)  Mixed hyperlipidemia -     Lipid panel  Primary hypertension -     CBC with Differential/Platelet -     CMP14+EGFR  Testosterone deficiency -     Testosterone,Free and Total  Screening for prostate cancer -     PSA, total and free  Neoplasm of uncertain behavior of skin -     Ambulatory referral to Dermatology  Need for immunization against influenza -     Flu Vaccine QUAD 78moIM (Fluarix, Fluzone & Alfiuria Quad PF)  Other orders -     fexofenadine (ALLEGRA) 180 MG tablet; Take 1 tablet (180 mg total) by mouth daily. For allergy symptoms -     nebivolol (BYSTOLIC) 10 MG tablet; Take 1 tablet (10 mg total) by mouth daily. -     Semaglutide-Weight Management 0.5 MG/0.5ML SOAJ; 1 ML sl daily       I have discontinued DLeonie Green Lant's metoprolol succinate. I  have also changed his nebivolol and Semaglutide-Weight Management. Additionally, I am having him maintain his Magnesium Oxide (MAG-OX PO), Vitamin D3, acetaminophen, b complex vitamins, Psyllium, fluticasone furoate-vilanterol, sildenafil, esomeprazole, ondansetron, Testosterone, celecoxib, rizatriptan, albuterol, dicyclomine, traZODone, Belsomra, and fexofenadine.  Allergies as of 11/13/2021       Reactions   Diovan [valsartan] Swelling   Quinapril Hcl Swelling   Quinapril Hcl         Medication List  Accurate as of November 13, 2021 11:59 PM. If you have any questions, ask your nurse or doctor.          STOP taking these medications    metoprolol succinate 50 MG 24 hr tablet Commonly known as: TOPROL-XL Stopped by: Claretta Fraise, MD       TAKE these medications    acetaminophen 325 MG tablet Commonly known as: TYLENOL Take 650 mg by mouth every 6 (six) hours as needed for pain.   albuterol 108 (90 Base) MCG/ACT inhaler Commonly known as: Ventolin HFA Inhale 1 puff into the lungs every 6 (six) hours as needed for wheezing.   b complex vitamins tablet Take 1 tablet by mouth daily.   Belsomra 10 MG Tabs Generic drug: Suvorexant TAKE 1 TABLET BY MOUTH AT BEDTIME AS NEEDED   celecoxib 200 MG capsule Commonly known as: CeleBREX Take 1 capsule (200 mg total) by mouth daily. With food What changed:  when to take this reasons to take this   dicyclomine 20 MG tablet Commonly known as: BENTYL Take 1 tablet (20 mg total) by mouth 3 (three) times daily before meals.   esomeprazole 20 MG capsule Commonly known as: NexIUM 24HR Take two capsules twice a day on an empty stomach   fexofenadine 180 MG tablet Commonly known as: ALLEGRA Take 1 tablet (180 mg total) by mouth daily. For allergy symptoms What changed:  when to take this reasons to take this   fluticasone furoate-vilanterol 200-25 MCG/ACT Aepb Commonly known as: Breo Ellipta Inhale 1 puff into  the lungs daily.   MAG-OX PO Take 1 tablet by mouth daily.   nebivolol 10 MG tablet Commonly known as: BYSTOLIC Take 1 tablet (10 mg total) by mouth daily.   ondansetron 4 MG tablet Commonly known as: Zofran Take 1 tablet (4 mg total) by mouth every 8 (eight) hours as needed for nausea or vomiting.   Psyllium 30.9 % Powd Drink 1 tablespoon dissolved in water, twice Daily   rizatriptan 10 MG disintegrating tablet Commonly known as: MAXALT-MLT TAKE 1 TABLET BY MOUTH AS NEEDED FOR migraine MAY REPEAT in TWO hours if neeeded   Semaglutide-Weight Management 0.5 MG/0.5ML Soaj 1 ML sl daily What changed: additional instructions Changed by: Claretta Fraise, MD   sildenafil 100 MG tablet Commonly known as: Viagra Take 1 tablet (100 mg total) by mouth daily as needed for erectile dysfunction.   Testosterone 30 MG/ACT Soln APPPLY TWO PUMPS TO EACH ARM ONCE DAILY   traZODone 150 MG tablet Commonly known as: DESYREL Take 150 mg by mouth at bedtime.   Vitamin D3 125 MCG (5000 UT) Caps Take 5,000 Units by mouth 2 (two) times daily.         Follow-up: Return in about 6 months (around 05/15/2022).  Claretta Fraise, M.D.

## 2021-11-15 ENCOUNTER — Encounter: Payer: Self-pay | Admitting: Family Medicine

## 2021-11-17 ENCOUNTER — Other Ambulatory Visit: Payer: Self-pay | Admitting: Family Medicine

## 2021-11-18 LAB — VITAMIN D 25 HYDROXY (VIT D DEFICIENCY, FRACTURES): Vit D, 25-Hydroxy: 98.7 ng/mL (ref 30.0–100.0)

## 2021-11-18 LAB — CBC WITH DIFFERENTIAL/PLATELET
Basophils Absolute: 0.1 10*3/uL (ref 0.0–0.2)
Basos: 1 %
EOS (ABSOLUTE): 0.1 10*3/uL (ref 0.0–0.4)
Eos: 1 %
Hematocrit: 51.7 % — ABNORMAL HIGH (ref 37.5–51.0)
Hemoglobin: 17.2 g/dL (ref 13.0–17.7)
Immature Grans (Abs): 0 10*3/uL (ref 0.0–0.1)
Immature Granulocytes: 0 %
Lymphocytes Absolute: 1.5 10*3/uL (ref 0.7–3.1)
Lymphs: 29 %
MCH: 28.6 pg (ref 26.6–33.0)
MCHC: 33.3 g/dL (ref 31.5–35.7)
MCV: 86 fL (ref 79–97)
Monocytes Absolute: 0.3 10*3/uL (ref 0.1–0.9)
Monocytes: 6 %
Neutrophils Absolute: 3.3 10*3/uL (ref 1.4–7.0)
Neutrophils: 63 %
Platelets: 115 10*3/uL — ABNORMAL LOW (ref 150–450)
RBC: 6.02 x10E6/uL — ABNORMAL HIGH (ref 4.14–5.80)
RDW: 12.6 % (ref 11.6–15.4)
WBC: 5.3 10*3/uL (ref 3.4–10.8)

## 2021-11-18 LAB — CMP14+EGFR
ALT: 31 IU/L (ref 0–44)
AST: 21 IU/L (ref 0–40)
Albumin/Globulin Ratio: 2.6 — ABNORMAL HIGH (ref 1.2–2.2)
Albumin: 4.7 g/dL (ref 3.8–4.9)
Alkaline Phosphatase: 74 IU/L (ref 44–121)
BUN/Creatinine Ratio: 13 (ref 9–20)
BUN: 18 mg/dL (ref 6–24)
Bilirubin Total: 0.4 mg/dL (ref 0.0–1.2)
CO2: 25 mmol/L (ref 20–29)
Calcium: 9.7 mg/dL (ref 8.7–10.2)
Chloride: 102 mmol/L (ref 96–106)
Creatinine, Ser: 1.37 mg/dL — ABNORMAL HIGH (ref 0.76–1.27)
Globulin, Total: 1.8 g/dL (ref 1.5–4.5)
Glucose: 97 mg/dL (ref 70–99)
Potassium: 4.8 mmol/L (ref 3.5–5.2)
Sodium: 141 mmol/L (ref 134–144)
Total Protein: 6.5 g/dL (ref 6.0–8.5)
eGFR: 62 mL/min/{1.73_m2} (ref >59)

## 2021-11-18 LAB — LIPID PANEL
Chol/HDL Ratio: 4 ratio (ref 0.0–5.0)
Cholesterol, Total: 174 mg/dL (ref 100–199)
HDL: 43 mg/dL (ref >39)
LDL Chol Calc (NIH): 115 mg/dL — ABNORMAL HIGH (ref 0–99)
Triglycerides: 86 mg/dL (ref 0–149)
VLDL Cholesterol Cal: 16 mg/dL (ref 5–40)

## 2021-11-18 LAB — PSA, TOTAL AND FREE
PSA, Free Pct: 24.6 %
PSA, Free: 0.32 ng/mL
Prostate Specific Ag, Serum: 1.3 ng/mL (ref 0.0–4.0)

## 2021-11-18 LAB — TESTOSTERONE,FREE AND TOTAL
Testosterone, Free: 2.9 pg/mL — ABNORMAL LOW (ref 7.2–24.0)
Testosterone: 156 ng/dL — ABNORMAL LOW (ref 264–916)

## 2021-11-24 ENCOUNTER — Other Ambulatory Visit: Payer: Self-pay | Admitting: *Deleted

## 2021-11-24 DIAGNOSIS — E349 Endocrine disorder, unspecified: Secondary | ICD-10-CM

## 2021-12-24 ENCOUNTER — Ambulatory Visit (INDEPENDENT_AMBULATORY_CARE_PROVIDER_SITE_OTHER): Payer: No Typology Code available for payment source

## 2021-12-24 ENCOUNTER — Encounter: Payer: Self-pay | Admitting: Family Medicine

## 2021-12-24 ENCOUNTER — Ambulatory Visit: Payer: No Typology Code available for payment source | Admitting: Family Medicine

## 2021-12-24 VITALS — BP 124/69 | HR 67 | Temp 97.8°F | Ht 69.0 in | Wt 267.0 lb

## 2021-12-24 DIAGNOSIS — J40 Bronchitis, not specified as acute or chronic: Secondary | ICD-10-CM

## 2021-12-24 DIAGNOSIS — J069 Acute upper respiratory infection, unspecified: Secondary | ICD-10-CM

## 2021-12-24 DIAGNOSIS — J4541 Moderate persistent asthma with (acute) exacerbation: Secondary | ICD-10-CM

## 2021-12-24 DIAGNOSIS — J329 Chronic sinusitis, unspecified: Secondary | ICD-10-CM

## 2021-12-24 MED ORDER — LEVOFLOXACIN 500 MG PO TABS: 500.0000 mg | ORAL_TABLET | Freq: Every day | ORAL | 0 refills | Status: DC

## 2021-12-24 MED ORDER — BETAMETHASONE SOD PHOS & ACET 6 (3-3) MG/ML IJ SUSP
6.0000 mg | Freq: Once | INTRAMUSCULAR | Status: AC
  Administered 2021-12-24: 6 mg via INTRAMUSCULAR

## 2021-12-24 NOTE — Progress Notes (Signed)
Chief Complaint  Patient presents with   Nasal Congestion   Sinusitis   Cough    HPI  Patient presents today for Symptoms include congestion, facial pain, nasal congestion, non productive cough, post nasal drip and sinus pressure. There is no fever, chills, or sweats. Onset of symptoms was10 days ago, gradually worsening since that time. Last few days hs movedinto the xchest.  The slightest exertion causes dyspnea. Cough real bad with exertion such as walking up steps. Coughing yellow, green to brown sputum. Exposed to RSV.    PMH: Smoking status noted ROS: Review of Systems  Constitutional:  Positive for fatigue. Negative for chills and fever.  HENT:  Positive for congestion, postnasal drip, rhinorrhea and sinus pressure. Negative for sore throat.   Respiratory:  Positive for cough, shortness of breath and wheezing.   Musculoskeletal:  Positive for myalgias.  Skin:  Negative for color change and rash.  Neurological:  Negative for dizziness.      Objective: BP 124/69   Pulse 67   Temp 97.8 F (36.6 C)   Ht '5\' 9"'$  (1.753 m)   Wt 267 lb (121.1 kg)   SpO2 96%   BMI 39.43 kg/m  Gen: NAD, alert, cooperative with exam HEENT: NCAT, EOMI, PERRL CV: RRR, good S1/S2, no murmur Resp: CTABL, faint  wheezes, non-labored.  Ext: No edema, warm Neuro: Alert and oriented, No gross deficits  Assessment and plan:  1. Moderate persistent asthmatic bronchitis with acute exacerbation   2. Upper respiratory infection with cough and congestion   3. Sinobronchitis     Meds ordered this encounter  Medications   levofloxacin (LEVAQUIN) 500 MG tablet    Sig: Take 1 tablet (500 mg total) by mouth daily. For 10 days    Dispense:  10 tablet    Refill:  0   betamethasone acetate-betamethasone sodium phosphate (CELESTONE) injection 6 mg    Orders Placed This Encounter  Procedures   COVID-19, Flu A+B and RSV    Order Specific Question:   Previously tested for COVID-19    Answer:   Unknown     Order Specific Question:   Resident in a congregate (group) care setting    Answer:   No    Order Specific Question:   Is the patient student?    Answer:   No    Order Specific Question:   Employed in healthcare setting    Answer:   No    Order Specific Question:   Has patient completed COVID vaccination(s) (2 doses of Pfizer/Moderna 1 dose of [employer])    Answer:   Unknown   DG Chest 2 View    Standing Status:   Future    Standing Expiration Date:   01/23/2022    Order Specific Question:   Reason for Exam (SYMPTOM  OR DIAGNOSIS REQUIRED)    Answer:   cough, dyspnea, COPD    Order Specific Question:   Preferred imaging location?    Answer:   Internal    Follow up as needed.  Claretta Fraise, MD

## 2021-12-26 LAB — COVID-19, FLU A+B AND RSV
Influenza A, NAA: NOT DETECTED
Influenza B, NAA: NOT DETECTED
RSV, NAA: NOT DETECTED
SARS-CoV-2, NAA: NOT DETECTED

## 2021-12-29 ENCOUNTER — Telehealth: Payer: Self-pay | Admitting: Pulmonary Disease

## 2021-12-29 NOTE — Progress Notes (Signed)
Hello Mekhi,  Your lab result is normal and/or stable.Some minor variations that are not significant are commonly marked abnormal, but do not represent any medical problem for you.  Best regards, Jareth Pardee, M.D.

## 2021-12-29 NOTE — Progress Notes (Signed)
Your chest x-ray looked normal. Thanks, WS.

## 2022-01-12 ENCOUNTER — Other Ambulatory Visit: Payer: Self-pay | Admitting: Family Medicine

## 2022-01-12 ENCOUNTER — Ambulatory Visit: Payer: No Typology Code available for payment source

## 2022-01-12 DIAGNOSIS — R0683 Snoring: Secondary | ICD-10-CM

## 2022-01-12 DIAGNOSIS — G4733 Obstructive sleep apnea (adult) (pediatric): Secondary | ICD-10-CM

## 2022-01-16 ENCOUNTER — Telehealth: Payer: Self-pay | Admitting: Pulmonary Disease

## 2022-01-16 DIAGNOSIS — G4733 Obstructive sleep apnea (adult) (pediatric): Secondary | ICD-10-CM | POA: Diagnosis not present

## 2022-01-16 NOTE — Telephone Encounter (Signed)
HST 01/12/22 >> AHI 19.4, SpO2 low 75%   Please inform him that his sleep study shows moderate obstructive sleep apnea.  Please arrange for ROV with me or NP to discuss treatment options.

## 2022-01-20 NOTE — Telephone Encounter (Signed)
This patient has had his HST .

## 2022-01-20 NOTE — Telephone Encounter (Signed)
Spoke with patient regarding HST results. They verbalized understanding. No further questions. Patient already had an appt scheduled for 03/13/22.  Nothing further needed at this time.

## 2022-01-30 ENCOUNTER — Other Ambulatory Visit: Payer: No Typology Code available for payment source

## 2022-01-30 DIAGNOSIS — E349 Endocrine disorder, unspecified: Secondary | ICD-10-CM

## 2022-02-04 ENCOUNTER — Other Ambulatory Visit: Payer: Self-pay | Admitting: Family Medicine

## 2022-02-08 LAB — TESTOSTERONE,FREE AND TOTAL
Testosterone, Free: 15.6 pg/mL (ref 7.2–24.0)
Testosterone: 854 ng/dL (ref 264–916)

## 2022-02-16 ENCOUNTER — Encounter: Payer: Self-pay | Admitting: Family Medicine

## 2022-02-16 ENCOUNTER — Ambulatory Visit: Payer: No Typology Code available for payment source | Admitting: Family Medicine

## 2022-02-16 VITALS — BP 116/71 | HR 71 | Temp 97.9°F | Ht 69.0 in | Wt 270.2 lb

## 2022-02-16 DIAGNOSIS — E349 Endocrine disorder, unspecified: Secondary | ICD-10-CM | POA: Diagnosis not present

## 2022-02-16 DIAGNOSIS — I10 Essential (primary) hypertension: Secondary | ICD-10-CM | POA: Diagnosis not present

## 2022-02-16 LAB — CBC WITH DIFFERENTIAL/PLATELET
Basophils Absolute: 0.1 x10E3/uL (ref 0.0–0.2)
Basos: 1 %
EOS (ABSOLUTE): 0.1 x10E3/uL (ref 0.0–0.4)
Eos: 3 %
Hematocrit: 51 % (ref 37.5–51.0)
Hemoglobin: 16.4 g/dL (ref 13.0–17.7)
Immature Grans (Abs): 0 x10E3/uL (ref 0.0–0.1)
Immature Granulocytes: 1 %
Lymphocytes Absolute: 1.4 x10E3/uL (ref 0.7–3.1)
Lymphs: 28 %
MCH: 28.3 pg (ref 26.6–33.0)
MCHC: 32.2 g/dL (ref 31.5–35.7)
MCV: 88 fL (ref 79–97)
Monocytes Absolute: 0.4 x10E3/uL (ref 0.1–0.9)
Monocytes: 8 %
Neutrophils Absolute: 3.1 x10E3/uL (ref 1.4–7.0)
Neutrophils: 59 %
Platelets: 122 x10E3/uL — ABNORMAL LOW (ref 150–450)
RBC: 5.79 x10E6/uL (ref 4.14–5.80)
RDW: 13.4 % (ref 11.6–15.4)
WBC: 5.1 x10E3/uL (ref 3.4–10.8)

## 2022-02-16 LAB — CMP14+EGFR
ALT: 54 IU/L — ABNORMAL HIGH (ref 0–44)
AST: 28 IU/L (ref 0–40)
Albumin/Globulin Ratio: 2.3 — ABNORMAL HIGH (ref 1.2–2.2)
Albumin: 4.4 g/dL (ref 3.8–4.9)
Alkaline Phosphatase: 61 IU/L (ref 44–121)
BUN/Creatinine Ratio: 13 (ref 9–20)
BUN: 16 mg/dL (ref 6–24)
Bilirubin Total: 0.3 mg/dL (ref 0.0–1.2)
CO2: 24 mmol/L (ref 20–29)
Calcium: 9.7 mg/dL (ref 8.7–10.2)
Chloride: 104 mmol/L (ref 96–106)
Creatinine, Ser: 1.28 mg/dL — ABNORMAL HIGH (ref 0.76–1.27)
Globulin, Total: 1.9 g/dL (ref 1.5–4.5)
Glucose: 113 mg/dL — ABNORMAL HIGH (ref 70–99)
Potassium: 5.4 mmol/L — ABNORMAL HIGH (ref 3.5–5.2)
Sodium: 142 mmol/L (ref 134–144)
Total Protein: 6.3 g/dL (ref 6.0–8.5)
eGFR: 67 mL/min/1.73

## 2022-02-16 MED ORDER — BELSOMRA 10 MG PO TABS
1.0000 | ORAL_TABLET | Freq: Every evening | ORAL | 1 refills | Status: DC | PRN
Start: 2022-02-16 — End: 2022-08-17

## 2022-02-16 MED ORDER — TESTOSTERONE 30 MG/ACT TD SOLN: TRANSDERMAL | 1 refills | Status: DC

## 2022-02-16 MED ORDER — SILDENAFIL CITRATE 100 MG PO TABS
100.0000 mg | ORAL_TABLET | Freq: Every day | ORAL | 5 refills | Status: DC | PRN
Start: 2022-02-16 — End: 2022-08-17

## 2022-02-16 MED ORDER — TRAZODONE HCL 150 MG PO TABS: 150.0000 mg | ORAL_TABLET | Freq: Every day | ORAL | 3 refills | Status: DC

## 2022-02-16 NOTE — Progress Notes (Signed)
Subjective:  Patient ID: Roy Lawrence, male    DOB: [birth year]  Age: 52 y.o. MRN: [id number]  CC: Medical Management of Chronic Issues   HPI Roy Lawrence presents for OSA - seeing Dr. Halford Chessman on Feb. 9. Test showed moderate apnea. Lowe energy still. Feels it is due to his weight. Semaglutide not effective. Lost to 225 when he was avoiding sugar ad exercising more.   Eating now just because he is bored.   Out of breath more easily since he has gained weight.      02/16/2022    8:00 AM 12/24/2021    2:16 PM 12/24/2021    2:06 PM  Depression screen PHQ 2/9  Decreased Interest 0 1 0  Down, Depressed, Hopeless 0 0 0  PHQ - 2 Score 0 1 0  Altered sleeping 2 2   Tired, decreased energy 2 2   Change in appetite 2 1   Feeling bad or failure about yourself  1 0   Trouble concentrating 1 0   Moving slowly or fidgety/restless 0 1   Suicidal thoughts 0 0   PHQ-9 Score 8 7   Difficult doing work/chores Somewhat difficult Somewhat difficult     History Roy Lawrence has a past medical history of Allergy, Anxiety, Asthma, Depression, Diverticulitis, Diverticulosis, Esophageal stricture, GERD (gastroesophageal reflux disease), Hypertension, Obesity, Sleep apnea, and Spondylosis.   He has a past surgical history that includes Shoulder surgery (Right); Finger surgery (Right); and Esophageal dilation.   His family history includes Asthma in his mother; Cancer in his father; Dementia in his maternal grandfather and maternal grandmother; Diabetes in his father; Diverticulitis in his mother; GER disease in his mother; Berenice Primas' disease in his father; Hodgkin's lymphoma in his father; Hyperlipidemia in his mother; Hypertension in his maternal grandfather, maternal grandmother, and mother; Liver cancer in his father; Thyroid disease in his father and sister.He reports that he has never smoked. He has never used smokeless tobacco. He reports current alcohol use. He reports that he does not use  drugs.    ROS Review of Systems  Constitutional:  Negative for fever.  Respiratory:  Negative for shortness of breath.   Cardiovascular:  Negative for chest pain.  Musculoskeletal:  Negative for arthralgias.  Skin:  Negative for rash.    Objective:  BP 116/71   Pulse 71   Temp 97.9 F (36.6 C)   Ht '5\' 9"'$  (1.753 m)   Wt 270 lb 3.2 oz (122.6 kg)   SpO2 94%   BMI 39.90 kg/m   BP Readings from Last 3 Encounters:  02/16/22 116/71  12/24/21 124/69  11/13/21 104/67    Wt Readings from Last 3 Encounters:  02/16/22 270 lb 3.2 oz (122.6 kg)  12/24/21 267 lb (121.1 kg)  11/13/21 257 lb 6.4 oz (116.8 kg)     Physical Exam Vitals reviewed.  Constitutional:      Appearance: He is well-developed.  HENT:     Head: Normocephalic and atraumatic.     Right Ear: External ear normal.     Left Ear: External ear normal.     Mouth/Throat:     Pharynx: No oropharyngeal exudate or posterior oropharyngeal erythema.  Eyes:     Pupils: Pupils are equal, round, and reactive to light.  Cardiovascular:     Rate and Rhythm: Normal rate and regular rhythm.     Heart sounds: No murmur heard. Pulmonary:     Effort: No respiratory distress.     Breath sounds:  Normal breath sounds.  Musculoskeletal:     Cervical back: Normal range of motion and neck supple.  Neurological:     Mental Status: He is alert and oriented to person, place, and time.       Assessment & Plan:   Roy Lawrence was seen today for medical management of chronic issues.  Diagnoses and all orders for this visit:  Primary hypertension -     CBC with Differential/Platelet -     CMP14+EGFR  Testosterone deficiency -     CBC with Differential/Platelet -     CMP14+EGFR  Other orders -     Suvorexant (BELSOMRA) 10 MG TABS; Take 1 tablet by mouth at bedtime as needed. -     Testosterone 30 MG/ACT SOLN; APPPLY TWO PUMPS TO EACH ARM ONCE DAILY -     traZODone (DESYREL) 150 MG tablet; Take 1 tablet (150 mg total) by mouth  at bedtime. -     sildenafil (VIAGRA) 100 MG tablet; Take 1 tablet (100 mg total) by mouth daily as needed for erectile dysfunction.       I have discontinued Leonie Green. Cichy's ondansetron, Semaglutide-Weight Management, and levofloxacin. I have also changed his Belsomra and traZODone. Additionally, I am having him maintain his Magnesium Oxide (MAG-OX PO), Vitamin D3, acetaminophen, b complex vitamins, Psyllium, esomeprazole, celecoxib, rizatriptan, albuterol, dicyclomine, fexofenadine, nebivolol, Breo Ellipta, Testosterone, and sildenafil.  Allergies as of 02/16/2022       Reactions   Diovan [valsartan] Swelling   Quinapril Hcl Swelling   Quinapril Hcl         Medication List        Accurate as of February 16, 2022  8:37 AM. If you have any questions, ask your nurse or doctor.          STOP taking these medications    levofloxacin 500 MG tablet Commonly known as: LEVAQUIN Stopped by: Claretta Fraise, MD   ondansetron 4 MG tablet Commonly known as: Zofran Stopped by: Claretta Fraise, MD   Semaglutide-Weight Management 0.5 MG/0.5ML Soaj Stopped by: Claretta Fraise, MD       TAKE these medications    acetaminophen 325 MG tablet Commonly known as: TYLENOL Take 650 mg by mouth every 6 (six) hours as needed for pain.   albuterol 108 (90 Base) MCG/ACT inhaler Commonly known as: Ventolin HFA Inhale 1 puff into the lungs every 6 (six) hours as needed for wheezing.   b complex vitamins tablet Take 1 tablet by mouth daily.   Belsomra 10 MG Tabs Generic drug: Suvorexant Take 1 tablet by mouth at bedtime as needed.   Breo Ellipta 200-25 MCG/ACT Aepb Generic drug: fluticasone furoate-vilanterol INHALE 1 PUFF BY MOUTH EVERY DAY   celecoxib 200 MG capsule Commonly known as: CeleBREX Take 1 capsule (200 mg total) by mouth daily. With food What changed:  when to take this reasons to take this   dicyclomine 20 MG tablet Commonly known as: BENTYL Take 1 tablet (20 mg  total) by mouth 3 (three) times daily before meals.   esomeprazole 20 MG capsule Commonly known as: NexIUM 24HR Take two capsules twice a day on an empty stomach   fexofenadine 180 MG tablet Commonly known as: ALLEGRA Take 1 tablet (180 mg total) by mouth daily. For allergy symptoms   MAG-OX PO Take 1 tablet by mouth daily.   nebivolol 10 MG tablet Commonly known as: BYSTOLIC Take 1 tablet (10 mg total) by mouth daily.   Psyllium 30.9 % Powd Drink  1 tablespoon dissolved in water, twice Daily   rizatriptan 10 MG disintegrating tablet Commonly known as: MAXALT-MLT TAKE 1 TABLET BY MOUTH AS NEEDED FOR migraine MAY REPEAT in TWO hours if neeeded   sildenafil 100 MG tablet Commonly known as: Viagra Take 1 tablet (100 mg total) by mouth daily as needed for erectile dysfunction.   Testosterone 30 MG/ACT Soln APPPLY TWO PUMPS TO EACH ARM ONCE DAILY   traZODone 150 MG tablet Commonly known as: DESYREL Take 1 tablet (150 mg total) by mouth at bedtime.   Vitamin D3 125 MCG (5000 UT) Caps Take 5,000 Units by mouth 2 (two) times daily.         Follow-up: Return in about 6 months (around 08/17/2022).  Claretta Fraise, M.D.

## 2022-02-17 NOTE — Progress Notes (Signed)
Hello Roy Lawrence,  Your lab result is normal and/or stable.Some minor variations that are not significant are commonly marked abnormal, but do not represent any medical problem for you.  Best regards, Lister Brizzi, M.D.

## 2022-02-25 ENCOUNTER — Ambulatory Visit: Payer: No Typology Code available for payment source | Admitting: Pulmonary Disease

## 2022-03-09 ENCOUNTER — Other Ambulatory Visit: Payer: Self-pay | Admitting: Family Medicine

## 2022-03-13 ENCOUNTER — Encounter: Payer: Self-pay | Admitting: Pulmonary Disease

## 2022-03-13 ENCOUNTER — Ambulatory Visit (INDEPENDENT_AMBULATORY_CARE_PROVIDER_SITE_OTHER): Payer: No Typology Code available for payment source | Admitting: Pulmonary Disease

## 2022-03-13 VITALS — BP 134/82 | HR 72 | Ht 69.0 in | Wt 268.6 lb

## 2022-03-13 DIAGNOSIS — Z7189 Other specified counseling: Secondary | ICD-10-CM

## 2022-03-13 DIAGNOSIS — G4733 Obstructive sleep apnea (adult) (pediatric): Secondary | ICD-10-CM

## 2022-03-13 NOTE — Patient Instructions (Signed)
Will arrange for auto CPAP set up  Follow up in 4 months 

## 2022-03-13 NOTE — Progress Notes (Signed)
Pulmonary, Critical Care, and Sleep Medicine  Chief Complaint  Patient presents with   Follow-up    Follow up on HST     Past Surgical History:  He  has a past surgical history that includes Shoulder surgery (Right); Finger surgery (Right); and Esophageal dilation.  Past Medical History:  Asthma, Allergies, HTN, Spondylosis, GERD, Esophageal stricture, Diverticulosis, Anxiety, Depression, PTSD  Constitutional:  BP 134/82   Pulse 72   Ht 5' 9"$  (1.753 m)   Wt 268 lb 9.6 oz (121.8 kg)   SpO2 95% Comment: room air  BMI 39.67 kg/m   Brief Summary:  Roy Lawrence is a 52 y.o. male with obstructive sleep apnea.      Subjective:   Home sleep study showed moderate sleep apnea.  Still has snoring, sleep disruption and daytime sleepiness.  Had cardiac evaluation for palpitations.  Physical Exam:   Appearance - well kempt   ENMT - no sinus tenderness, no oral exudate, no LAN, Mallampati 4 airway, no stridor  Respiratory - equal breath sounds bilaterally, no wheezing or rales  CV - s1s2 regular rate and rhythm, no murmurs  Ext - no clubbing, no edema  Skin - no rashes  Psych - normal mood and affect   Sleep Tests:  HST 05/18/14 >> AHI 15 Auto CPAP 12/04/15 to 01/02/16 >> used on 6 of 30 nights with average 7 hrs 31 min.  Average AHI 0.7 with median CPAP 8 and 95 th percentile CPAP 10 cm H2O HST 01/12/22 >> AHI 19.4, SpO2 low 75%   Cardiac Tests:  Echo 12/21/14 >> EF 55 to 60%, grade 1 DD  Social History:  He  reports that he has never smoked. He has never used smokeless tobacco. He reports current alcohol use. He reports that he does not use drugs.  Family History:  His family history includes Asthma in his mother; Cancer in his father; Dementia in his maternal grandfather and maternal grandmother; Diabetes in his father; Diverticulitis in his mother; GER disease in his mother; Berenice Primas' disease in his father; Hodgkin's lymphoma in his father; Hyperlipidemia  in his mother; Hypertension in his maternal grandfather, maternal grandmother, and mother; Liver cancer in his father; Thyroid disease in his father and sister.     Assessment/Plan:   Obstructive sleep apnea. - reviewed his sleep study - discussed how sleep apnea can impact his health - treatment options reviewed - will arrange for [facility] auto CPAP 5 to 15 cm H2O  Insomnia. 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 If you have any questions, ask your nurse or doctor.          acetaminophen 325 MG tablet Commonly known as: TYLENOL Take 650 mg by mouth every 6 (six) hours as needed for pain.   albuterol 108 (90 Base) MCG/ACT inhaler Commonly known  as: Ventolin HFA Inhale 1 puff into the lungs every 6 (six) hours as needed for wheezing.   b complex vitamins tablet Take 1 tablet by mouth daily.   Belsomra 10 MG Tabs Generic drug: Suvorexant Take 1 tablet by mouth at bedtime as needed.   Breo Ellipta 200-25 MCG/ACT Aepb Generic drug: fluticasone furoate-vilanterol INHALE 1 PUFF BY MOUTH EVERY DAY   celecoxib 200 MG capsule Commonly known as: CeleBREX Take 1 capsule (200 mg total) by mouth daily. With food What changed:  when to take this reasons to take this   dicyclomine 20 MG tablet Commonly known as: BENTYL Take 1 tablet (20 mg total) by mouth 3 (three) times daily before meals.   esomeprazole 20 MG capsule Commonly known as: NexIUM 24HR Take  two capsules twice a day on an empty stomach   fexofenadine 180 MG tablet Commonly known as: ALLEGRA Take 1 tablet (180 mg total) by mouth daily. For allergy symptoms   MAG-OX PO Take 1 tablet by mouth daily.   nebivolol 10 MG tablet Commonly known as: BYSTOLIC Take 1 tablet (10 mg total) by mouth daily.   Psyllium 30.9 % Powd Drink 1 tablespoon dissolved in water, twice Daily   rizatriptan 10 MG disintegrating tablet Commonly known as: MAXALT-MLT TAKE 1 TABLET BY MOUTH AS NEEDED FOR migraine MAY REPEAT in TWO hours if neeeded   sildenafil 100 MG tablet Commonly known as: Viagra Take 1 tablet (100 mg total) by mouth daily as needed for erectile dysfunction.   Testosterone 30 MG/ACT Soln APPPLY TWO PUMPS TO EACH ARM ONCE DAILY   traZODone 150 MG tablet Commonly known as: DESYREL Take 1 tablet (150 mg total) by mouth at bedtime.   Vitamin D3 125 MCG (5000 UT) Caps Take 5,000 Units by mouth 2 (two) times daily.        Signature:  Chesley Mires, MD [place] Pager - [phone] 03/13/2022, 8:40 AM

## 2022-04-21 ENCOUNTER — Other Ambulatory Visit: Payer: Self-pay | Admitting: Family Medicine

## 2022-05-06 ENCOUNTER — Other Ambulatory Visit: Payer: Self-pay | Admitting: Family Medicine

## 2022-05-06 ENCOUNTER — Telehealth: Payer: Self-pay | Admitting: Family Medicine

## 2022-05-06 NOTE — Telephone Encounter (Signed)
TC back to pt after talking to pharmacy pt does have refills on file, pharmacy is having to order his Allegra and it is just not in yet. Pt aware.

## 2022-05-06 NOTE — Telephone Encounter (Signed)
  Prescription Request  05/06/2022  Is this a "Controlled Substance" medicine? fexofenadine (ALLEGRA) 180 MG tablet   Have you seen your PCP in the last 2 weeks? 08/17/2022  If YES, route message to pool  -  If NO, patient needs to be scheduled for appointment.  What is the name of the medication or equipment? fexofenadine (ALLEGRA) 180 MG tablet   Have you contacted your pharmacy to request a refill? Yes NA   Which pharmacy would you like this sent to? [facility]   Patient notified that their request is being sent to the clinical staff for review and that they should receive a response within 2 business days.

## 2022-05-12 ENCOUNTER — Other Ambulatory Visit: Payer: Self-pay | Admitting: Family Medicine

## 2022-06-14 ENCOUNTER — Other Ambulatory Visit: Payer: Self-pay | Admitting: Family Medicine

## 2022-07-27 ENCOUNTER — Ambulatory Visit: Payer: No Typology Code available for payment source | Admitting: Pulmonary Disease

## 2022-08-10 ENCOUNTER — Ambulatory Visit (INDEPENDENT_AMBULATORY_CARE_PROVIDER_SITE_OTHER): Payer: No Typology Code available for payment source | Admitting: Pulmonary Disease

## 2022-08-10 ENCOUNTER — Encounter: Payer: Self-pay | Admitting: Pulmonary Disease

## 2022-08-10 VITALS — BP 127/81 | HR 64 | Ht 69.0 in | Wt 267.0 lb

## 2022-08-10 DIAGNOSIS — G4733 Obstructive sleep apnea (adult) (pediatric): Secondary | ICD-10-CM

## 2022-08-10 NOTE — Progress Notes (Signed)
[place] Pulmonary, Critical Care, and Sleep Medicine  Chief Complaint  Patient presents with   Follow-up    Past Surgical History:  He  has a past surgical history that includes Shoulder surgery (Right); Finger surgery (Right); and Esophageal dilation.  Past Medical History:  Asthma, Allergies, HTN, Spondylosis, GERD, Esophageal stricture, Diverticulosis, Anxiety, Depression, PTSD  Constitutional:  BP 127/81   Pulse 64   Ht 5\' 9"  (1.753 m)   Wt 267 lb (121.1 kg)   SpO2 95%   BMI 39.43 kg/m   Brief Summary:  Roy Lawrence is a 52 y.o. male with obstructive sleep apnea.      Subjective:   Using CPAP nightly.  No issues with mask fit.  Still using trazodone and belsomra.  Feels tired during the day sometimes, but is able to keep up with daily activities.  Physical Exam:   Appearance - well kempt   ENMT - no sinus tenderness, no oral exudate, no LAN, Mallampati 3 airway, no stridor  Respiratory - equal breath sounds bilaterally, no wheezing or rales  CV - s1s2 regular rate and rhythm, no murmurs  Ext - no clubbing, no edema  Skin - no rashes  Psych - normal mood and affect   Sleep Tests:  HST 05/18/14 >> AHI 15 HST 01/12/22 >> AHI 19.4, SpO2 low 75%  Auto CPAP 07/11/22 to 08/09/22 >> used on 29 of 30 nights with average 6 hrs 49 min.  Average AHI 1.5 with median CPAP 9 and 95 th percentile CPAP 12 cm H2O  Cardiac Tests:  Echo 12/21/14 >> EF 55 to 60%, grade 1 DD  Social History:  He  reports that he has never smoked. He has never used smokeless tobacco. He reports current alcohol use. He reports that he does not use drugs.  Family History:  His family history includes Asthma in his mother; Cancer in his father; Dementia in his maternal grandfather and maternal grandmother; Diabetes in his father; Diverticulitis in his mother; GER disease in his mother; Luiz Blare' disease in his father; Hodgkin's lymphoma in his father; Hyperlipidemia in his mother; Hypertension  in his maternal grandfather, maternal grandmother, and mother; Liver cancer in his father; Thyroid disease in his father and sister.     Assessment/Plan:   Obstructive sleep apnea. - he is compliant with CPAP and reports benefit from therapy - uses [employer] for his DME - current CPAP ordered February 2024 - continue auto CPAP 5 to 15 cm H2O  Insomnia. 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 If you have any questions, ask your nurse or doctor.          acetaminophen 325 MG tablet Commonly known as: TYLENOL Take 650 mg by mouth every 6 (six) hours as needed for pain.   albuterol 108 (90 Base) MCG/ACT inhaler Commonly known as: Ventolin HFA Inhale 1 puff into the lungs  every 6 (six) hours as needed for wheezing.   b complex vitamins tablet Take 1 tablet by mouth daily.   Belsomra 10 MG Tabs Generic drug: Suvorexant Take 1 tablet by mouth at bedtime as needed.   Breo Ellipta 200-25 MCG/ACT Aepb Generic drug: fluticasone furoate-vilanterol INHALE 1 PUFF BY MOUTH EVERY DAY   celecoxib 200 MG capsule Commonly known as: CeleBREX Take 1 capsule (200 mg total) by mouth daily. With food What changed:  when to take this reasons to take this   dicyclomine 20 MG tablet Commonly known as: BENTYL Take 1 tablet (20 mg total) by mouth 3 (three) times daily before meals.   fexofenadine 180 MG tablet Commonly known as: ALLEGRA Take 1 tablet (180 mg total) by mouth daily. For allergy symptoms   MAG-OX  PO Take 1 tablet by mouth daily.   nebivolol 10 MG tablet Commonly known as: BYSTOLIC Take 1 tablet (10 mg total) by mouth daily.   NexIUM 24HR 20 MG capsule Generic drug: esomeprazole TAKE TWO CAPSULES BY MOUTH TWICE DAILY ON an EMPTY stomach   Psyllium 30.9 % Powd Drink 1 tablespoon dissolved in water, twice Daily   rizatriptan 10 MG disintegrating tablet Commonly known as: MAXALT-MLT TAKE 1 TABLET BY MOUTH AS NEEDED FOR MIGRAINE - MAY REPEAT IN TWO HOURS IF NEEDED.   sildenafil 100 MG tablet Commonly known as: Viagra Take 1 tablet (100 mg total) by mouth daily as needed for erectile dysfunction.   Testosterone 30 MG/ACT Soln APPPLY TWO PUMPS TO EACH ARM ONCE DAILY   traZODone 150 MG tablet Commonly known as: DESYREL Take 1 tablet (150 mg total) by mouth at bedtime.   Vitamin D3 125 MCG (5000 UT) Caps Take 5,000 Units by mouth 2 (two) times daily.        Signature:  Coralyn Helling, MD [facility] Pulmonary/Critical Care Pager - [phone] 08/10/2022, 8:53 AM

## 2022-08-10 NOTE — Patient Instructions (Signed)
Follow up in 1 year.

## 2022-08-12 ENCOUNTER — Other Ambulatory Visit: Payer: Self-pay | Admitting: Family Medicine

## 2022-08-17 ENCOUNTER — Encounter: Payer: Self-pay | Admitting: Family Medicine

## 2022-08-17 ENCOUNTER — Ambulatory Visit: Payer: No Typology Code available for payment source | Admitting: Family Medicine

## 2022-08-17 VITALS — BP 126/68 | HR 71 | Temp 97.6°F | Ht 69.0 in | Wt 265.2 lb

## 2022-08-17 DIAGNOSIS — I10 Essential (primary) hypertension: Secondary | ICD-10-CM | POA: Diagnosis not present

## 2022-08-17 DIAGNOSIS — E349 Endocrine disorder, unspecified: Secondary | ICD-10-CM

## 2022-08-17 DIAGNOSIS — D696 Thrombocytopenia, unspecified: Secondary | ICD-10-CM

## 2022-08-17 DIAGNOSIS — I471 Supraventricular tachycardia, unspecified: Secondary | ICD-10-CM | POA: Diagnosis not present

## 2022-08-17 LAB — CMP14+EGFR
AST: 29 IU/L (ref 0–40)
Alkaline Phosphatase: 67 IU/L (ref 44–121)
Bilirubin Total: 0.3 mg/dL (ref 0.0–1.2)
CO2: 25 mmol/L (ref 20–29)
Calcium: 9.4 mg/dL (ref 8.7–10.2)
Potassium: 4.6 mmol/L (ref 3.5–5.2)
Sodium: 142 mmol/L (ref 134–144)
eGFR: 65 mL/min/{1.73_m2} (ref >59)

## 2022-08-17 LAB — CBC WITH DIFFERENTIAL/PLATELET
Basophils Absolute: 0.1 10*3/uL (ref 0.0–0.2)
Hematocrit: 49.8 % (ref 37.5–51.0)
Lymphocytes Absolute: 1.3 10*3/uL (ref 0.7–3.1)
Lymphs: 18 %
MCH: 29.3 pg (ref 26.6–33.0)
MCV: 88 fL (ref 79–97)
Monocytes: 7 %
Neutrophils Absolute: 5.3 10*3/uL (ref 1.4–7.0)
Neutrophils: 72 %
Platelets: 124 10*3/uL — ABNORMAL LOW (ref 150–450)
RBC: 5.64 x10E6/uL (ref 4.14–5.80)
WBC: 7.3 10*3/uL (ref 3.4–10.8)

## 2022-08-17 LAB — LIPID PANEL
HDL: 34 mg/dL — ABNORMAL LOW (ref >39)
Triglycerides: 102 mg/dL (ref 0–149)

## 2022-08-17 LAB — TESTOSTERONE,FREE AND TOTAL

## 2022-08-17 MED ORDER — FLUTICASONE FUROATE-VILANTEROL 200-25 MCG/ACT IN AEPB
1.0000 | INHALATION_SPRAY | Freq: Every day | RESPIRATORY_TRACT | 5 refills | Status: DC
Start: 2022-08-17 — End: 2023-05-19

## 2022-08-17 MED ORDER — ESOMEPRAZOLE MAGNESIUM 20 MG PO CPDR: 40.0000 mg | DELAYED_RELEASE_CAPSULE | Freq: Two times a day (BID) | ORAL | 3 refills | Status: DC

## 2022-08-17 MED ORDER — TESTOSTERONE 30 MG/ACT TD SOLN: TRANSDERMAL | 1 refills | Status: DC

## 2022-08-17 MED ORDER — DICYCLOMINE HCL 20 MG PO TABS: 20.0000 mg | ORAL_TABLET | Freq: Three times a day (TID) | ORAL | 3 refills | Status: DC

## 2022-08-17 MED ORDER — BELSOMRA 10 MG PO TABS: 1.0000 | ORAL_TABLET | Freq: Every evening | ORAL | 1 refills | Status: DC | PRN

## 2022-08-17 MED ORDER — SILDENAFIL CITRATE 100 MG PO TABS
100.0000 mg | ORAL_TABLET | Freq: Every day | ORAL | 5 refills | Status: DC | PRN
Start: 2022-08-17 — End: 2022-11-23

## 2022-08-17 MED ORDER — CELECOXIB 400 MG PO CAPS: 400.0000 mg | ORAL_CAPSULE | Freq: Every day | ORAL | 1 refills | Status: DC

## 2022-08-17 NOTE — Progress Notes (Signed)
Subjective:  Patient ID: Roy Lawrence, male    DOB: 1970/06/14  Age: 52 y.o. MRN: [id number]  CC: Medical Management of Chronic Issues   HPI Roy Lawrence presents for  follow-up of hypertension. Patient has no history of headache chest pain or shortness of breath or recent cough. Patient also denies symptoms of TIA such as focal numbness or weakness. Patient denies side effects from medication. States taking it regularly.  E.D. not responding to med. Using testosterone 1 pump in each axilla since level was high. Not helping. Neither is sildenafil   History Roy Lawrence has a past medical history of Allergy, Anxiety, Asthma, Depression, Diverticulitis, Diverticulosis, Esophageal stricture, GERD (gastroesophageal reflux disease), Hypertension, Obesity, Sleep apnea, and Spondylosis.   He has a past surgical history that includes Shoulder surgery (Right); Finger surgery (Right); and Esophageal dilation.   His family history includes Asthma in his mother; Cancer in his father; Dementia in his maternal grandfather and maternal grandmother; Diabetes in his father; Diverticulitis in his mother; GER disease in his mother; Luiz Blare' disease in his father; Hodgkin's lymphoma in his father; Hyperlipidemia in his mother; Hypertension in his maternal grandfather, maternal grandmother, and mother; Liver cancer in his father; Thyroid disease in his father and sister.He reports that he has never smoked. He has never used smokeless tobacco. He reports current alcohol use. He reports that he does not use drugs.  Current Outpatient Medications on File Prior to Visit  Medication Sig Dispense Refill   acetaminophen (TYLENOL) 325 MG tablet Take 650 mg by mouth every 6 (six) hours as needed for pain.     albuterol (VENTOLIN HFA) 108 (90 Base) MCG/ACT inhaler Inhale 1 puff into the lungs every 6 (six) hours as needed for wheezing. 18 each 3   b complex vitamins tablet Take 1 tablet by mouth daily.     Cholecalciferol  (VITAMIN D3) 5000 UNITS CAPS Take 5,000 Units by mouth 2 (two) times daily.     fexofenadine (ALLEGRA) 180 MG tablet Take 1 tablet (180 mg total) by mouth daily. For allergy symptoms 90 tablet 3   Magnesium Oxide (MAG-OX PO) Take 1 tablet by mouth daily.      nebivolol (BYSTOLIC) 10 MG tablet Take 1 tablet (10 mg total) by mouth daily. 90 tablet 3   Psyllium 30.9 % POWD Drink 1 tablespoon dissolved in water, twice Daily  11   rizatriptan (MAXALT-MLT) 10 MG disintegrating tablet TAKE 1 TABLET BY MOUTH AS NEEDED FOR MIGRAINE - MAY REPEAT IN TWO HOURS IF NEEDED. 10 tablet 0   traZODone (DESYREL) 150 MG tablet Take 1 tablet (150 mg total) by mouth at bedtime. 90 tablet 3   No current facility-administered medications on file prior to visit.    ROS Review of Systems  Constitutional:  Negative for fever.  Respiratory:  Negative for shortness of breath.   Cardiovascular:  Positive for palpitations (infrequent). Negative for chest pain.  Musculoskeletal:  Positive for myalgias (frequent - all over). Negative for arthralgias.  Skin:  Negative for rash.    Objective:  BP 126/68   Pulse 71   Temp 97.6 F (36.4 C)   Ht 5\' 9"  (1.753 m)   Wt 265 lb 3.2 oz (120.3 kg)   SpO2 94%   BMI 39.16 kg/m   BP Readings from Last 3 Encounters:  08/17/22 126/68  08/10/22 127/81  03/13/22 134/82    Wt Readings from Last 3 Encounters:  08/17/22 265 lb 3.2 oz (120.3 kg)  08/10/22 267  lb (121.1 kg)  03/13/22 268 lb 9.6 oz (121.8 kg)     Physical Exam Vitals reviewed.  Constitutional:      Appearance: He is well-developed.  HENT:     Head: Normocephalic and atraumatic.     Right Ear: External ear normal.     Left Ear: External ear normal.     Mouth/Throat:     Pharynx: No oropharyngeal exudate or posterior oropharyngeal erythema.  Eyes:     Pupils: Pupils are equal, round, and reactive to light.  Cardiovascular:     Rate and Rhythm: Normal rate and regular rhythm.     Heart sounds: No murmur  heard. Pulmonary:     Effort: No respiratory distress.     Breath sounds: Normal breath sounds.  Musculoskeletal:     Cervical back: Normal range of motion and neck supple.  Neurological:     Mental Status: He is alert and oriented to person, place, and time.       Assessment & Plan:   Roy Lawrence was seen today for medical management of chronic issues.  Diagnoses and all orders for this visit:  SVT (supraventricular tachycardia) -     CBC with Differential/Platelet -     CMP14+EGFR -     Lipid panel  Thrombocytopenia (HCC) -     CBC with Differential/Platelet -     CMP14+EGFR -     Lipid panel  Testosterone deficiency -     CBC with Differential/Platelet -     CMP14+EGFR -     Testosterone,Free and Total -     Lipid panel  Primary hypertension -     CBC with Differential/Platelet -     CMP14+EGFR -     Lipid panel  Other orders -     dicyclomine (BENTYL) 20 MG tablet; Take 1 tablet (20 mg total) by mouth 3 (three) times daily before meals. -     esomeprazole (NEXIUM) 20 MG capsule; Take 2 capsules (40 mg total) by mouth 2 (two) times daily before a meal. TAKE TWO CAPSULES BY MOUTH TWICE DAILY ON an EMPTY stomach -     fluticasone furoate-vilanterol (BREO ELLIPTA) 200-25 MCG/ACT AEPB; Inhale 1 puff into the lungs daily. INHALE 1 PUFF BY MOUTH EVERY DAY -     sildenafil (VIAGRA) 100 MG tablet; Take 1 tablet (100 mg total) by mouth daily as needed for erectile dysfunction. -     Suvorexant (BELSOMRA) 10 MG TABS; Take 1 tablet (10 mg total) by mouth at bedtime as needed. -     Testosterone 30 MG/ACT SOLN; APPPLY TWO PUMPS TO EACH ARM ONCE DAILY -     celecoxib (CELEBREX) 400 MG capsule; Take 1 capsule (400 mg total) by mouth daily. With food   Allergies as of 08/17/2022       Reactions   Diovan [valsartan] Swelling   Quinapril Hcl Swelling   Quinapril Hcl         Medication List        Accurate as of August 17, 2022  9:12 AM. If you have any questions, ask your  nurse or doctor.          acetaminophen 325 MG tablet Commonly known as: TYLENOL Take 650 mg by mouth every 6 (six) hours as needed for pain.   albuterol 108 (90 Base) MCG/ACT inhaler Commonly known as: Ventolin HFA Inhale 1 puff into the lungs every 6 (six) hours as needed for wheezing.   b complex vitamins tablet Take  1 tablet by mouth daily.   Belsomra 10 MG Tabs Generic drug: Suvorexant Take 1 tablet (10 mg total) by mouth at bedtime as needed. What changed: how much to take Changed by: Broadus John Mac Dowdell   celecoxib 400 MG capsule Commonly known as: CeleBREX Take 1 capsule (400 mg total) by mouth daily. With food What changed:  medication strength how much to take Changed by: Lilyann Gravelle   dicyclomine 20 MG tablet Commonly known as: BENTYL Take 1 tablet (20 mg total) by mouth 3 (three) times daily before meals.   esomeprazole 20 MG capsule Commonly known as: NEXIUM Take 2 capsules (40 mg total) by mouth 2 (two) times daily before a meal. TAKE TWO CAPSULES BY MOUTH TWICE DAILY ON an EMPTY stomach What changed: See the new instructions. Changed by: Dayami Taitt   fexofenadine 180 MG tablet Commonly known as: ALLEGRA Take 1 tablet (180 mg total) by mouth daily. For allergy symptoms   fluticasone furoate-vilanterol 200-25 MCG/ACT Aepb Commonly known as: Breo Ellipta Inhale 1 puff into the lungs daily. INHALE 1 PUFF BY MOUTH EVERY DAY What changed: See the new instructions. Changed by: Yurem Viner   MAG-OX PO Take 1 tablet by mouth daily.   nebivolol 10 MG tablet Commonly known as: BYSTOLIC Take 1 tablet (10 mg total) by mouth daily.   Psyllium 30.9 % Powd Drink 1 tablespoon dissolved in water, twice Daily   rizatriptan 10 MG disintegrating tablet Commonly known as: MAXALT-MLT TAKE 1 TABLET BY MOUTH AS NEEDED FOR MIGRAINE - MAY REPEAT IN TWO HOURS IF NEEDED.   sildenafil 100 MG tablet Commonly known as: Viagra Take 1 tablet (100 mg total) by mouth  daily as needed for erectile dysfunction.   Testosterone 30 MG/ACT Soln APPPLY TWO PUMPS TO EACH ARM ONCE DAILY   traZODone 150 MG tablet Commonly known as: DESYREL Take 1 tablet (150 mg total) by mouth at bedtime.   Vitamin D3 125 MCG (5000 UT) Caps Take 5,000 Units by mouth 2 (two) times daily.        Meds ordered this encounter  Medications   dicyclomine (BENTYL) 20 MG tablet    Sig: Take 1 tablet (20 mg total) by mouth 3 (three) times daily before meals.    Dispense:  270 tablet    Refill:  3   esomeprazole (NEXIUM) 20 MG capsule    Sig: Take 2 capsules (40 mg total) by mouth 2 (two) times daily before a meal. TAKE TWO CAPSULES BY MOUTH TWICE DAILY ON an EMPTY stomach    Dispense:  360 capsule    Refill:  3   fluticasone furoate-vilanterol (BREO ELLIPTA) 200-25 MCG/ACT AEPB    Sig: Inhale 1 puff into the lungs daily. INHALE 1 PUFF BY MOUTH EVERY DAY    Dispense:  60 each    Refill:  5   sildenafil (VIAGRA) 100 MG tablet    Sig: Take 1 tablet (100 mg total) by mouth daily as needed for erectile dysfunction.    Dispense:  10 tablet    Refill:  5   Suvorexant (BELSOMRA) 10 MG TABS    Sig: Take 1 tablet (10 mg total) by mouth at bedtime as needed.    Dispense:  90 tablet    Refill:  1    This prescription was filled on 08/29/2021. Any refills authorized will be placed on file.   Testosterone 30 MG/ACT SOLN    Sig: APPPLY TWO PUMPS TO EACH ARM ONCE DAILY    Dispense:  180 mL    Refill:  1   celecoxib (CELEBREX) 400 MG capsule    Sig: Take 1 capsule (400 mg total) by mouth daily. With food    Dispense:  90 capsule    Refill:  1    Detailed discussion of weight loss strategies  Follow-up: Return in about 6 months (around 02/17/2023) for [employer] physical.  Mechele Claude, M.D.

## 2022-08-18 LAB — CBC WITH DIFFERENTIAL/PLATELET
Basos: 1 %
EOS (ABSOLUTE): 0.1 10*3/uL (ref 0.0–0.4)
Eos: 2 %
Hemoglobin: 16.5 g/dL (ref 13.0–17.7)
Immature Grans (Abs): 0 10*3/uL (ref 0.0–0.1)
Immature Granulocytes: 0 %
MCHC: 33.1 g/dL (ref 31.5–35.7)
Monocytes Absolute: 0.5 10*3/uL (ref 0.1–0.9)
RDW: 13.2 % (ref 11.6–15.4)

## 2022-08-18 LAB — CMP14+EGFR
ALT: 57 IU/L — ABNORMAL HIGH (ref 0–44)
Albumin: 4.4 g/dL (ref 3.8–4.9)
BUN/Creatinine Ratio: 11 (ref 9–20)
BUN: 15 mg/dL (ref 6–24)
Chloride: 104 mmol/L (ref 96–106)
Creatinine, Ser: 1.31 mg/dL — ABNORMAL HIGH (ref 0.76–1.27)
Globulin, Total: 1.9 g/dL (ref 1.5–4.5)
Glucose: 112 mg/dL — ABNORMAL HIGH (ref 70–99)
Total Protein: 6.3 g/dL (ref 6.0–8.5)

## 2022-08-18 LAB — LIPID PANEL
Chol/HDL Ratio: 4.4 ratio (ref 0.0–5.0)
Cholesterol, Total: 150 mg/dL (ref 100–199)
LDL Chol Calc (NIH): 97 mg/dL (ref 0–99)
VLDL Cholesterol Cal: 19 mg/dL (ref 5–40)

## 2022-08-18 LAB — TESTOSTERONE,FREE AND TOTAL: Testosterone, Free: 6.8 pg/mL — ABNORMAL LOW (ref 7.2–24.0)

## 2022-09-18 ENCOUNTER — Other Ambulatory Visit: Payer: Self-pay | Admitting: Family Medicine

## 2022-09-22 ENCOUNTER — Ambulatory Visit: Payer: No Typology Code available for payment source | Admitting: Family Medicine

## 2022-09-22 ENCOUNTER — Encounter: Payer: Self-pay | Admitting: Family Medicine

## 2022-09-22 VITALS — BP 118/70 | HR 60 | Temp 98.3°F | Ht 69.0 in | Wt 272.6 lb

## 2022-09-22 DIAGNOSIS — R002 Palpitations: Secondary | ICD-10-CM | POA: Diagnosis not present

## 2022-09-22 DIAGNOSIS — R001 Bradycardia, unspecified: Secondary | ICD-10-CM

## 2022-09-22 NOTE — Progress Notes (Signed)
Subjective:  Patient ID: Roy Lawrence, male    DOB: [birth year]  Age: 52 y.o. MRN: [id number]  CC: Palpitations and Bradycardia   HPI Roy Lawrence presents for HR under 50 this AM on awakening. Felt heart pounding. Lasted > 10 min. Feeling lethargic. Feels off.Slight chest tightness for 2 min. No radiation. His watch notified him of the decreased heart rate. He has a graph on his phone showing some tachycardia as well, however, the vast majority of his HR on his graph is in normal range. Pt. Denies job stress. He has some physical stress.     09/22/2022    2:08 PM 08/17/2022    8:10 AM 08/17/2022    8:02 AM  Depression screen PHQ 2/9  Decreased Interest 0 0 0  Down, Depressed, Hopeless 0 0 0  PHQ - 2 Score 0 0 0  Altered sleeping 3 3   Tired, decreased energy 3 3   Change in appetite 1 0   Feeling bad or failure about yourself  0 0   Trouble concentrating 0 0   Moving slowly or fidgety/restless 1 0   Suicidal thoughts 0 0   PHQ-9 Score 8 6   Difficult doing work/chores Not difficult at all Not difficult at all     History Roy Lawrence has a past medical history of Allergy, Anxiety, Asthma, Depression, Diverticulitis, Diverticulosis, Esophageal stricture, GERD (gastroesophageal reflux disease), Hypertension, Obesity, Sleep apnea, and Spondylosis.   He has a past surgical history that includes Shoulder surgery (Right); Finger surgery (Right); and Esophageal dilation.   His family history includes Asthma in his mother; Cancer in his father; Dementia in his maternal grandfather and maternal grandmother; Diabetes in his father; Diverticulitis in his mother; GER disease in his mother; Roy Blare' disease in his father; Hodgkin's lymphoma in his father; Hyperlipidemia in his mother; Hypertension in his maternal grandfather, maternal grandmother, and mother; Liver cancer in his father; Thyroid disease in his father and sister.He reports that he has never smoked. He has never used smokeless tobacco.  He reports current alcohol use. He reports that he does not use drugs.    ROS Review of Systems  Constitutional:  Positive for fatigue. Negative for diaphoresis and fever.  Respiratory:  Negative for shortness of breath.   Cardiovascular:  Negative for chest pain.  Gastrointestinal:  Negative for nausea and vomiting.  Musculoskeletal:  Negative for arthralgias.  Skin:  Negative for rash.    Objective:  BP 118/70   Pulse 60   Temp 98.3 F (36.8 C)   Ht 5\' 9"  (1.753 m)   Wt 272 lb 9.6 oz (123.7 kg)   SpO2 95%   BMI 40.26 kg/m   BP Readings from Last 3 Encounters:  09/22/22 118/70  08/17/22 126/68  08/10/22 127/81    Wt Readings from Last 3 Encounters:  09/22/22 272 lb 9.6 oz (123.7 kg)  08/17/22 265 lb 3.2 oz (120.3 kg)  08/10/22 267 lb (121.1 kg)     Physical Exam Vitals reviewed.  Constitutional:      Appearance: He is well-developed.  HENT:     Head: Normocephalic and atraumatic.     Right Ear: External ear normal.     Left Ear: External ear normal.     Mouth/Throat:     Pharynx: No oropharyngeal exudate or posterior oropharyngeal erythema.  Eyes:     Pupils: Pupils are equal, round, and reactive to light.  Cardiovascular:     Rate and Rhythm: Normal rate and  regular rhythm.     Heart sounds: No murmur heard. Pulmonary:     Effort: No respiratory distress.     Breath sounds: Normal breath sounds.  Musculoskeletal:     Cervical back: Normal range of motion and neck supple.  Neurological:     Mental Status: He is alert and oriented to person, place, and time.    EKG: Sinus brady at 55. Nml intervals, segments. No ischemic changes   Assessment & Plan:   Roy Lawrence was seen today for palpitations and bradycardia.  Diagnoses and all orders for this visit:  Palpitations -     EKG 12-Lead  Bradycardia       I am having Roy Lawrence maintain his Magnesium Oxide (MAG-OX PO), Vitamin D3, acetaminophen, b complex vitamins, Psyllium, albuterol,  fexofenadine, nebivolol, traZODone, dicyclomine, esomeprazole, fluticasone furoate-vilanterol, sildenafil, Belsomra, Testosterone, celecoxib, and rizatriptan.  Allergies as of 09/22/2022       Reactions   Diovan [valsartan] Swelling   Quinapril Hcl Swelling   Quinapril Hcl         Medication List        Accurate as of September 22, 2022  5:50 PM. If you have any questions, ask your nurse or doctor.          acetaminophen 325 MG tablet Commonly known as: TYLENOL Take 650 mg by mouth every 6 (six) hours as needed for pain.   albuterol 108 (90 Base) MCG/ACT inhaler Commonly known as: Ventolin HFA Inhale 1 puff into the lungs every 6 (six) hours as needed for wheezing.   b complex vitamins tablet Take 1 tablet by mouth daily.   Belsomra 10 MG Tabs Generic drug: Suvorexant Take 1 tablet (10 mg total) by mouth at bedtime as needed.   celecoxib 400 MG capsule Commonly known as: CeleBREX Take 1 capsule (400 mg total) by mouth daily. With food   dicyclomine 20 MG tablet Commonly known as: BENTYL Take 1 tablet (20 mg total) by mouth 3 (three) times daily before meals.   esomeprazole 20 MG capsule Commonly known as: NEXIUM Take 2 capsules (40 mg total) by mouth 2 (two) times daily before a meal. TAKE TWO CAPSULES BY MOUTH TWICE DAILY ON an EMPTY stomach   fexofenadine 180 MG tablet Commonly known as: ALLEGRA Take 1 tablet (180 mg total) by mouth daily. For allergy symptoms   fluticasone furoate-vilanterol 200-25 MCG/ACT Aepb Commonly known as: Breo Ellipta Inhale 1 puff into the lungs daily. INHALE 1 PUFF BY MOUTH EVERY DAY   MAG-OX PO Take 1 tablet by mouth daily.   nebivolol 10 MG tablet Commonly known as: BYSTOLIC Take 1 tablet (10 mg total) by mouth daily.   Psyllium 30.9 % Powd Drink 1 tablespoon dissolved in water, twice Daily   rizatriptan 10 MG disintegrating tablet Commonly known as: MAXALT-MLT TAKE 1 TABLET BY MOUTH AS NEEDED FOR MIGRAINE - MAY REPEAT  IN 2 HOURS IF NEEDED.   sildenafil 100 MG tablet Commonly known as: Viagra Take 1 tablet (100 mg total) by mouth daily as needed for erectile dysfunction.   Testosterone 30 MG/ACT Soln APPPLY TWO PUMPS TO EACH ARM ONCE DAILY   traZODone 150 MG tablet Commonly known as: DESYREL Take 1 tablet (150 mg total) by mouth at bedtime.   Vitamin D3 125 MCG (5000 UT) Caps Take 5,000 Units by mouth 2 (two) times daily.         Follow-up: Return if symptoms worsen or fail to improve.  Mechele Claude, M.D.

## 2022-10-11 ENCOUNTER — Other Ambulatory Visit: Payer: Self-pay | Admitting: Family Medicine

## 2022-10-22 ENCOUNTER — Other Ambulatory Visit: Payer: Self-pay | Admitting: Family Medicine

## 2022-11-17 ENCOUNTER — Other Ambulatory Visit: Payer: Self-pay | Admitting: Family Medicine

## 2022-11-19 ENCOUNTER — Ambulatory Visit: Payer: No Typology Code available for payment source | Admitting: Family Medicine

## 2022-11-19 ENCOUNTER — Encounter: Payer: Self-pay | Admitting: Family Medicine

## 2022-11-19 VITALS — BP 124/72 | HR 70 | Temp 97.7°F | Ht 69.0 in | Wt 269.4 lb

## 2022-11-19 DIAGNOSIS — Z0000 Encounter for general adult medical examination without abnormal findings: Secondary | ICD-10-CM

## 2022-11-19 DIAGNOSIS — I10 Essential (primary) hypertension: Secondary | ICD-10-CM

## 2022-11-19 DIAGNOSIS — E349 Endocrine disorder, unspecified: Secondary | ICD-10-CM

## 2022-11-19 DIAGNOSIS — Z0001 Encounter for general adult medical examination with abnormal findings: Secondary | ICD-10-CM | POA: Diagnosis not present

## 2022-11-19 DIAGNOSIS — F5101 Primary insomnia: Secondary | ICD-10-CM

## 2022-11-19 DIAGNOSIS — E782 Mixed hyperlipidemia: Secondary | ICD-10-CM

## 2022-11-19 DIAGNOSIS — Z23 Encounter for immunization: Secondary | ICD-10-CM

## 2022-11-19 DIAGNOSIS — E559 Vitamin D deficiency, unspecified: Secondary | ICD-10-CM

## 2022-11-19 DIAGNOSIS — Z125 Encounter for screening for malignant neoplasm of prostate: Secondary | ICD-10-CM

## 2022-11-19 DIAGNOSIS — R002 Palpitations: Secondary | ICD-10-CM

## 2022-11-19 LAB — URINALYSIS
Bilirubin, UA: NEGATIVE
Glucose, UA: NEGATIVE
Leukocytes,UA: NEGATIVE
Nitrite, UA: NEGATIVE
Protein,UA: NEGATIVE
RBC, UA: NEGATIVE
Specific Gravity, UA: >1.03 — ABNORMAL HIGH (ref 1.005–1.030)
Urobilinogen, Ur: 0.2 mg/dL (ref 0.2–1.0)
pH, UA: 6 (ref 5.0–7.5)

## 2022-11-19 MED ORDER — BELSOMRA 20 MG PO TABS: 20.0000 mg | ORAL_TABLET | Freq: Every evening | ORAL | 1 refills | Status: DC | PRN

## 2022-11-19 MED ORDER — FEXOFENADINE HCL 180 MG PO TABS: 180.0000 mg | ORAL_TABLET | Freq: Every day | ORAL | 3 refills | Status: DC

## 2022-11-19 NOTE — Addendum Note (Signed)
Addended by: Adella Hare B on: 11/19/2022 05:17 PM   Modules accepted: Orders

## 2022-11-19 NOTE — Progress Notes (Addendum)
Subjective:  Patient ID: Roy Lawrence, male    DOB: [birth year]  Age: 52 y.o. MRN: [id number]  CC: Annual Exam   HPI MASAKI ROTHBAUER presents for CPE. Poor sleep, awake a lot even with combo meds Tstosterone  use is consistent. Strength reemains good. Looking at ways to lose weight - advance meal prep etc.    presents for  follow-up of hypertension. Patient has no history of headache chest pain or shortness of breath or recent cough. Patient also denies symptoms of TIA such as focal numbness or weakness. Patient denies side effects from medication. States taking it regularly.      11/19/2022    9:01 AM 11/19/2022    8:54 AM 09/22/2022    2:08 PM  Depression screen PHQ 2/9  Decreased Interest 0 0 0  Down, Depressed, Hopeless 0 0 0  PHQ - 2 Score 0 0 0  Altered sleeping 3  3  Tired, decreased energy 3  3  Change in appetite 1  1  Feeling bad or failure about yourself  0  0  Trouble concentrating 1  0  Moving slowly or fidgety/restless 0  1  Suicidal thoughts 0  0  PHQ-9 Score 8  8  Difficult doing work/chores Somewhat difficult  Not difficult at all    History Tahmir has a past medical history of Allergy, Anxiety, Asthma, Depression, Diverticulitis, Diverticulosis, Esophageal stricture, GERD (gastroesophageal reflux disease), Hypertension, Obesity, Sleep apnea, and Spondylosis.   He has a past surgical history that includes Shoulder surgery (Right); Finger surgery (Right); and Esophageal dilation.   His family history includes Asthma in his mother; Cancer in his father; Dementia in his maternal grandfather and maternal grandmother; Diabetes in his father; Diverticulitis in his mother; GER disease in his mother; Luiz Blare' disease in his father; Hodgkin's lymphoma in his father; Hyperlipidemia in his mother; Hypertension in his maternal grandfather, maternal grandmother, and mother; Liver cancer in his father; Thyroid disease in his father and sister.He reports that he has never  smoked. He has never used smokeless tobacco. He reports current alcohol use. He reports that he does not use drugs.    ROS Review of Systems  Constitutional:  Negative for activity change, fatigue and unexpected weight change.  HENT:  Negative for congestion, ear pain, hearing loss, postnasal drip and trouble swallowing.   Eyes:  Negative for pain and visual disturbance.  Respiratory:  Negative for cough, chest tightness and shortness of breath.   Cardiovascular:  Positive for palpitations (a few short episodes. Occurring wit activity.). Negative for chest pain and leg swelling.  Gastrointestinal:  Negative for abdominal distention, abdominal pain, blood in stool, constipation, diarrhea, nausea and vomiting.  Endocrine: Negative for cold intolerance, heat intolerance and polydipsia.  Genitourinary:  Negative for difficulty urinating, dysuria, flank pain, frequency and urgency.  Musculoskeletal:  Positive for arthralgias (left knee, twisted it. Right shoulder). Negative for joint swelling.  Skin:  Negative for color change, rash and wound.  Neurological:  Negative for dizziness, syncope, speech difficulty, weakness, light-headedness, numbness and headaches.  Hematological:  Does not bruise/bleed easily.  Psychiatric/Behavioral:  Positive for sleep disturbance. Negative for confusion, decreased concentration and dysphoric mood. The patient is nervous/anxious (on edge due to wife's health).     Objective:  BP 124/72   Pulse 70   Temp 97.7 F (36.5 C)   Ht 5\' 9"  (1.753 m)   Wt 269 lb 6.4 oz (122.2 kg)   SpO2 94%   BMI 39.78  kg/m   BP Readings from Last 3 Encounters:  11/19/22 124/72  09/22/22 118/70  08/17/22 126/68    Wt Readings from Last 3 Encounters:  11/19/22 269 lb 6.4 oz (122.2 kg)  09/22/22 272 lb 9.6 oz (123.7 kg)  08/17/22 265 lb 3.2 oz (120.3 kg)     Physical Exam Constitutional:      Appearance: He is well-developed. He is obese.  HENT:     Head: Normocephalic  and atraumatic.  Eyes:     Pupils: Pupils are equal, round, and reactive to light.  Neck:     Thyroid: No thyromegaly.     Trachea: No tracheal deviation.  Cardiovascular:     Rate and Rhythm: Normal rate and regular rhythm.     Heart sounds: Normal heart sounds. No murmur heard.    No friction rub. No gallop.  Pulmonary:     Breath sounds: Normal breath sounds. No wheezing or rales.  Abdominal:     General: Bowel sounds are normal. There is no distension.     Palpations: Abdomen is soft. There is no mass.     Tenderness: There is no abdominal tenderness.     Hernia: There is no hernia in the left inguinal area.  Genitourinary:    Penis: Normal.      Testes: Normal.  Musculoskeletal:        General: Normal range of motion.     Cervical back: Normal range of motion.  Lymphadenopathy:     Cervical: No cervical adenopathy.  Skin:    General: Skin is warm and dry.  Neurological:     Mental Status: He is alert and oriented to person, place, and time.      Assessment & Plan:   Warner was seen today for annual exam.  Diagnoses and all orders for this visit:  Well adult exam  Primary hypertension -     CBC with Differential/Platelet -     CMP14+EGFR -     Urinalysis  Testosterone deficiency -     Testosterone,Free and Total  Mixed hyperlipidemia -     Lipid panel  Prostate cancer screening -     PSA, total and free  Vitamin D deficiency -     VITAMIN D 25 Hydroxy (Vit-D Deficiency, Fractures)  Primary insomnia  Palpitations -     TSH  Other orders -     fexofenadine (ALLEGRA) 180 MG tablet; Take 1 tablet (180 mg total) by mouth daily. For allergy symptoms -     Suvorexant (BELSOMRA) 20 MG TABS; Take 1 tablet (20 mg total) by mouth at bedtime as needed.       I have discontinued Piedad Climes. Safran's Belsomra. I am also having him start on Belsomra. Additionally, I am having him maintain his Magnesium Oxide (MAG-OX PO), Vitamin D3, acetaminophen, b complex  vitamins, Psyllium, albuterol, traZODone, dicyclomine, esomeprazole, fluticasone furoate-vilanterol, sildenafil, celecoxib, Testosterone, rizatriptan, nebivolol, and fexofenadine.  Allergies as of 11/19/2022       Reactions   Diovan [valsartan] Swelling   Quinapril Hcl Swelling   Quinapril Hcl         Medication List        Accurate as of November 19, 2022  9:45 AM. If you have any questions, ask your nurse or doctor.          acetaminophen 325 MG tablet Commonly known as: TYLENOL Take 650 mg by mouth every 6 (six) hours as needed for pain.   albuterol 108 (90  Base) MCG/ACT inhaler Commonly known as: Ventolin HFA Inhale 1 puff into the lungs every 6 (six) hours as needed for wheezing.   b complex vitamins tablet Take 1 tablet by mouth daily.   Belsomra 20 MG Tabs Generic drug: Suvorexant Take 1 tablet (20 mg total) by mouth at bedtime as needed. What changed:  medication strength how much to take Changed by: Broadus John Hassaan Crite   celecoxib 400 MG capsule Commonly known as: CeleBREX Take 1 capsule (400 mg total) by mouth daily. With food   dicyclomine 20 MG tablet Commonly known as: BENTYL Take 1 tablet (20 mg total) by mouth 3 (three) times daily before meals.   esomeprazole 20 MG capsule Commonly known as: NEXIUM Take 2 capsules (40 mg total) by mouth 2 (two) times daily before a meal. TAKE TWO CAPSULES BY MOUTH TWICE DAILY ON an EMPTY stomach   fexofenadine 180 MG tablet Commonly known as: ALLEGRA Take 1 tablet (180 mg total) by mouth daily. For allergy symptoms   fluticasone furoate-vilanterol 200-25 MCG/ACT Aepb Commonly known as: Breo Ellipta Inhale 1 puff into the lungs daily. INHALE 1 PUFF BY MOUTH EVERY DAY   MAG-OX PO Take 1 tablet by mouth daily.   nebivolol 10 MG tablet Commonly known as: BYSTOLIC TAKE 1 TABLET BY MOUTH EVERY DAY   Psyllium 30.9 % Powd Drink 1 tablespoon dissolved in water, twice Daily   rizatriptan 10 MG disintegrating  tablet Commonly known as: MAXALT-MLT TAKE 1 TABLET BY MOUTH AS NEEDED FOR MIGRAINE - MAY REPEAT IN 2 HOURS IF NEEDED.   sildenafil 100 MG tablet Commonly known as: Viagra Take 1 tablet (100 mg total) by mouth daily as needed for erectile dysfunction.   Testosterone 30 MG/ACT Soln apply 2 pumps TO each ARM ONCE DAILY   traZODone 150 MG tablet Commonly known as: DESYREL Take 1 tablet (150 mg total) by mouth at bedtime.   Vitamin D3 125 MCG (5000 UT) Caps Take 5,000 Units by mouth 2 (two) times daily.         Follow-up: Return in about 6 months (around 05/20/2023).  Mechele Claude, M.D.

## 2022-11-19 NOTE — Addendum Note (Signed)
Addended by: Mechele Claude on: 11/19/2022 09:45 AM   Modules accepted: Orders

## 2022-11-21 LAB — CBC WITH DIFFERENTIAL/PLATELET
Basophils Absolute: 0.1 10*3/uL (ref 0.0–0.2)
Basos: 1 %
EOS (ABSOLUTE): 0.1 10*3/uL (ref 0.0–0.4)
Eos: 2 %
Hematocrit: 50.1 % (ref 37.5–51.0)
Hemoglobin: 16.2 g/dL (ref 13.0–17.7)
Immature Grans (Abs): 0 10*3/uL (ref 0.0–0.1)
Immature Granulocytes: 1 %
Lymphocytes Absolute: 1.3 10*3/uL (ref 0.7–3.1)
Lymphs: 23 %
MCH: 28.7 pg (ref 26.6–33.0)
MCHC: 32.3 g/dL (ref 31.5–35.7)
MCV: 89 fL (ref 79–97)
Monocytes Absolute: 0.4 10*3/uL (ref 0.1–0.9)
Monocytes: 7 %
Neutrophils Absolute: 3.9 10*3/uL (ref 1.4–7.0)
Neutrophils: 66 %
Platelets: 124 10*3/uL — ABNORMAL LOW (ref 150–450)
RBC: 5.64 x10E6/uL (ref 4.14–5.80)
RDW: 12.3 % (ref 11.6–15.4)
WBC: 5.7 10*3/uL (ref 3.4–10.8)

## 2022-11-21 LAB — CMP14+EGFR
ALT: 44 [IU]/L (ref 0–44)
AST: 27 [IU]/L (ref 0–40)
Albumin: 4.2 g/dL (ref 3.8–4.9)
Alkaline Phosphatase: 58 [IU]/L (ref 44–121)
BUN/Creatinine Ratio: 14 (ref 9–20)
BUN: 19 mg/dL (ref 6–24)
Bilirubin Total: 0.4 mg/dL (ref 0.0–1.2)
CO2: 24 mmol/L (ref 20–29)
Calcium: 9.5 mg/dL (ref 8.7–10.2)
Chloride: 102 mmol/L (ref 96–106)
Creatinine, Ser: 1.37 mg/dL — ABNORMAL HIGH (ref 0.76–1.27)
Globulin, Total: 2 g/dL (ref 1.5–4.5)
Glucose: 103 mg/dL — ABNORMAL HIGH (ref 70–99)
Potassium: 4.7 mmol/L (ref 3.5–5.2)
Sodium: 140 mmol/L (ref 134–144)
Total Protein: 6.2 g/dL (ref 6.0–8.5)
eGFR: 61 mL/min/{1.73_m2} (ref >59)

## 2022-11-21 LAB — LIPID PANEL
Chol/HDL Ratio: 4.8 {ratio} (ref 0.0–5.0)
Cholesterol, Total: 169 mg/dL (ref 100–199)
HDL: 35 mg/dL — ABNORMAL LOW (ref >39)
LDL Chol Calc (NIH): 117 mg/dL — ABNORMAL HIGH (ref 0–99)
Triglycerides: 91 mg/dL (ref 0–149)
VLDL Cholesterol Cal: 17 mg/dL (ref 5–40)

## 2022-11-21 LAB — TESTOSTERONE,FREE AND TOTAL
Testosterone, Free: 21.5 pg/mL (ref 7.2–24.0)
Testosterone: 923 ng/dL — ABNORMAL HIGH (ref 264–916)

## 2022-11-21 LAB — PSA, TOTAL AND FREE
PSA, Free Pct: 27.1 %
PSA, Free: 0.46 ng/mL
Prostate Specific Ag, Serum: 1.7 ng/mL (ref 0.0–4.0)

## 2022-11-21 LAB — TSH: TSH: 1.93 u[IU]/mL (ref 0.450–4.500)

## 2022-11-21 LAB — VITAMIN D 25 HYDROXY (VIT D DEFICIENCY, FRACTURES): Vit D, 25-Hydroxy: 94.8 ng/mL (ref 30.0–100.0)

## 2022-11-22 NOTE — Progress Notes (Signed)
Hello Jabron,  Your lab result is normal and/or stable.Some minor variations that are not significant are commonly marked abnormal, but do not represent any medical problem for you.  Best regards, Emunah Texidor, M.D.

## 2022-11-23 ENCOUNTER — Other Ambulatory Visit: Payer: Self-pay | Admitting: Family Medicine

## 2022-11-23 ENCOUNTER — Telehealth: Payer: Self-pay | Admitting: Family Medicine

## 2022-11-23 MED ORDER — TADALAFIL 5 MG PO TABS: 5.0000 mg | ORAL_TABLET | Freq: Every day | ORAL | 11 refills | Status: DC

## 2022-11-23 NOTE — Telephone Encounter (Signed)
Please let the patient know that I sent their prescription to their pharmacy. Thanks, WS 

## 2022-11-23 NOTE — Telephone Encounter (Signed)
Patient aware.

## 2022-11-23 NOTE — Telephone Encounter (Signed)
Pt called stating that he had a visit with Dr Darlyn Read last week and was told that he was going to send in a Rx to help with his prostate but says the pharmacy doesn't have any Rx for that. Please advise.

## 2022-12-03 ENCOUNTER — Other Ambulatory Visit: Payer: Self-pay | Admitting: Family Medicine

## 2023-01-18 ENCOUNTER — Other Ambulatory Visit: Payer: Self-pay | Admitting: Family Medicine

## 2023-01-19 ENCOUNTER — Other Ambulatory Visit: Payer: Self-pay | Admitting: Family Medicine

## 2023-02-07 ENCOUNTER — Other Ambulatory Visit: Payer: Self-pay | Admitting: Family Medicine

## 2023-02-17 ENCOUNTER — Other Ambulatory Visit: Payer: Self-pay | Admitting: Family Medicine

## 2023-03-13 ENCOUNTER — Other Ambulatory Visit: Payer: Self-pay | Admitting: Family Medicine

## 2023-04-19 ENCOUNTER — Other Ambulatory Visit: Payer: Self-pay | Admitting: Family Medicine

## 2023-05-09 ENCOUNTER — Other Ambulatory Visit: Payer: Self-pay | Admitting: Family Medicine

## 2023-05-18 ENCOUNTER — Other Ambulatory Visit: Payer: Self-pay | Admitting: Family Medicine

## 2023-05-20 ENCOUNTER — Encounter: Payer: Self-pay | Admitting: Family Medicine

## 2023-05-20 ENCOUNTER — Ambulatory Visit: Payer: No Typology Code available for payment source | Admitting: Family Medicine

## 2023-05-20 VITALS — BP 131/79 | HR 74 | Temp 97.9°F | Ht 69.0 in | Wt 266.0 lb

## 2023-05-20 DIAGNOSIS — K219 Gastro-esophageal reflux disease without esophagitis: Secondary | ICD-10-CM | POA: Diagnosis not present

## 2023-05-20 DIAGNOSIS — E349 Endocrine disorder, unspecified: Secondary | ICD-10-CM

## 2023-05-20 DIAGNOSIS — M25531 Pain in right wrist: Secondary | ICD-10-CM

## 2023-05-20 DIAGNOSIS — R202 Paresthesia of skin: Secondary | ICD-10-CM | POA: Diagnosis not present

## 2023-05-20 DIAGNOSIS — I10 Essential (primary) hypertension: Secondary | ICD-10-CM

## 2023-05-20 DIAGNOSIS — M25532 Pain in left wrist: Secondary | ICD-10-CM

## 2023-05-20 DIAGNOSIS — G4733 Obstructive sleep apnea (adult) (pediatric): Secondary | ICD-10-CM

## 2023-05-20 DIAGNOSIS — E782 Mixed hyperlipidemia: Secondary | ICD-10-CM

## 2023-05-20 MED ORDER — TESTOSTERONE 30 MG/ACT TD SOLN: TRANSDERMAL | 1 refills | Status: DC

## 2023-05-20 MED ORDER — ESOMEPRAZOLE MAGNESIUM 20 MG PO CPDR: 40.0000 mg | DELAYED_RELEASE_CAPSULE | Freq: Two times a day (BID) | ORAL | 3 refills | Status: DC

## 2023-05-20 MED ORDER — BELSOMRA 20 MG PO TABS
20.0000 mg | ORAL_TABLET | Freq: Every evening | ORAL | 1 refills | Status: DC | PRN
Start: 2023-05-20 — End: 2023-11-19

## 2023-05-20 MED ORDER — RIZATRIPTAN BENZOATE 10 MG PO TBDP
ORAL_TABLET | ORAL | 1 refills | Status: DC
Start: 2023-05-20 — End: 2023-07-07

## 2023-05-20 MED ORDER — FLUTICASONE FUROATE-VILANTEROL 200-25 MCG/ACT IN AEPB: 1.0000 | INHALATION_SPRAY | Freq: Every day | RESPIRATORY_TRACT | 5 refills | Status: DC

## 2023-05-20 MED ORDER — DICLOFENAC SODIUM 75 MG PO TBEC: 75.0000 mg | DELAYED_RELEASE_TABLET | Freq: Two times a day (BID) | ORAL | 1 refills | Status: DC

## 2023-05-20 NOTE — Progress Notes (Signed)
 Subjective:  Patient ID: Roy Lawrence, male    DOB: 1970-11-26  Age: 53 y.o. MRN: [id number]  CC: Medical Management of Chronic Issues   HPI TEVIN SHILLINGFORD presents for Patient in for follow-up of GERD. Currently asymptomatic taking  PPI daily. There is no chest pain or heartburn. No hematemesis and no melena. No dysphagia or choking. Onset is remote. Progression is stable. Complicating factors, none.   presents for  follow-up of hypertension. Patient has no history of headache chest pain or shortness of breath or recent cough. Patient also denies symptoms of TIA such as focal numbness or weakness. Patient denies side effects from medication. States taking it regularly. Home readings running 130s/80s.   Follow up for testosterone  deficiency: Pt. Using medication as directed. Denies any sx referrable to DVT such as edema or erythema of legs. No dyspnea or chest pain. Energy level reported as being good. Libido is normal and denies E.D. Feels strength is adequate and improved from baseline.   Patient also takes Belsomra  and trazodone  for sleep.  Occasional wheezing, but not dyspneic.      05/20/2023    8:19 AM 11/19/2022    9:01 AM 11/19/2022    8:54 AM  Depression screen PHQ 2/9  Decreased Interest 0 0 0  Down, Depressed, Hopeless 1 0 0  PHQ - 2 Score 1 0 0  Altered sleeping 3 3   Tired, decreased energy 0 3   Change in appetite 0 1   Feeling bad or failure about yourself  0 0   Trouble concentrating 1 1   Moving slowly or fidgety/restless 1 0   Suicidal thoughts 0 0   PHQ-9 Score 6 8   Difficult doing work/chores Somewhat difficult Somewhat difficult     History Darean has a past medical history of Allergy, Anxiety, Asthma, Depression, Diverticulitis, Diverticulosis, Esophageal stricture, GERD (gastroesophageal reflux disease), Hypertension, Obesity, Sleep apnea, and Spondylosis.   He has a past surgical history that includes Shoulder surgery (Right); Finger surgery  (Right); and Esophageal dilation.   His family history includes Asthma in his mother; Cancer in his father; Dementia in his maternal grandfather and maternal grandmother; Diabetes in his father; Diverticulitis in his mother; GER disease in his mother; Murrell Arrant' disease in his father; Hodgkin's lymphoma in his father; Hyperlipidemia in his mother; Hypertension in his maternal grandfather, maternal grandmother, and mother; Liver cancer in his father; Thyroid disease in his father and sister.He reports that he has never smoked. He has never used smokeless tobacco. He reports current alcohol use. He reports that he does not use drugs.    ROS Review of Systems  Constitutional: Negative.  Negative for fever.  HENT: Negative.    Eyes:  Negative for visual disturbance.  Respiratory:  Negative for cough and shortness of breath.   Cardiovascular:  Negative for chest pain and leg swelling.  Gastrointestinal:  Negative for abdominal pain, diarrhea, nausea and vomiting.  Genitourinary:  Negative for difficulty urinating.  Musculoskeletal:  Positive for arthralgias (left knee and wrist pain. Arthritis and tendinitis). Negative for myalgias.  Skin:  Negative for rash.  Neurological:  Negative for headaches.  Psychiatric/Behavioral:  Negative for sleep disturbance.     Objective:  BP 131/79   Pulse 74   Temp 97.9 F (36.6 C)   Ht 5\' 9"  (1.753 m)   Wt 266 lb (120.7 kg)   SpO2 95%   BMI 39.28 kg/m   BP Readings from Last 3 Encounters:  05/20/23 131/79  11/19/22 124/72  09/22/22 118/70    Wt Readings from Last 3 Encounters:  05/20/23 266 lb (120.7 kg)  11/19/22 269 lb 6.4 oz (122.2 kg)  09/22/22 272 lb 9.6 oz (123.7 kg)     Physical Exam Vitals reviewed.  Constitutional:      Appearance: He is well-developed.  HENT:     Head: Normocephalic and atraumatic.     Right Ear: External ear normal.     Left Ear: External ear normal.     Mouth/Throat:     Pharynx: No oropharyngeal exudate or  posterior oropharyngeal erythema.  Eyes:     Pupils: Pupils are equal, round, and reactive to light.  Cardiovascular:     Rate and Rhythm: Normal rate and regular rhythm.     Heart sounds: No murmur heard. Pulmonary:     Effort: No respiratory distress.     Breath sounds: Normal breath sounds.  Musculoskeletal:     Cervical back: Normal range of motion and neck supple.  Neurological:     Mental Status: He is alert and oriented to person, place, and time.      Assessment & Plan:  Pain in both wrists -     Ambulatory referral to Neurology -     CMP14+EGFR -     Diclofenac  Sodium; Take 1 tablet (75 mg total) by mouth 2 (two) times daily. For muscle and  Joint pain  Dispense: 180 tablet; Refill: 1  Paresthesias -     Ambulatory referral to Neurology -     CMP14+EGFR  Gastroesophageal reflux disease without esophagitis -     CBC with Differential/Platelet -     CMP14+EGFR  Primary hypertension -     CBC with Differential/Platelet -     CMP14+EGFR  OSA (obstructive sleep apnea) -     CMP14+EGFR  Mixed hyperlipidemia -     CMP14+EGFR -     Lipid panel  Testosterone  deficiency -     Testosterone ,Free and Total -     CBC with Differential/Platelet  Other orders -     Rizatriptan  Benzoate; TAKE 1 TABLET BY MOUTH AS NEEDED FOR MIGRAINE - MAY REPEAT IN 2 HOURS IF NEEDED.  Dispense: 10 tablet; Refill: 1 -     Belsomra ; Take 1 tablet (20 mg total) by mouth at bedtime as needed.  Dispense: 90 tablet; Refill: 1 -     Testosterone ; apply 2 pumps TO each ARM ONCE DAILY  Dispense: 180 mL; Refill: 1 -     Fluticasone  Furoate-Vilanterol; Inhale 1 puff into the lungs daily. INHALE 1 PUFF BY MOUTH EVERY DAY  Dispense: 60 each; Refill: 5 -     Esomeprazole  Magnesium ; Take 2 capsules (40 mg total) by mouth 2 (two) times daily before a meal. TAKE TWO CAPSULES BY MOUTH TWICE DAILY ON an EMPTY stomach  Dispense: 360 capsule; Refill: 3     Follow-up: Return in about 6 months (around  11/19/2023) for [facility]ete physical.  Roise Cleaver, M.D.

## 2023-05-21 LAB — LIPID PANEL
Chol/HDL Ratio: 5 ratio (ref 0.0–5.0)
Cholesterol, Total: 170 mg/dL (ref 100–199)
HDL: 34 mg/dL — ABNORMAL LOW (ref >39)
LDL Chol Calc (NIH): 115 mg/dL — ABNORMAL HIGH (ref 0–99)
Triglycerides: 112 mg/dL (ref 0–149)
VLDL Cholesterol Cal: 21 mg/dL (ref 5–40)

## 2023-05-21 LAB — CBC WITH DIFFERENTIAL/PLATELET
Basophils Absolute: 0.1 10*3/uL (ref 0.0–0.2)
Basos: 1 %
EOS (ABSOLUTE): 0.1 10*3/uL (ref 0.0–0.4)
Eos: 2 %
Hematocrit: 48.9 % (ref 37.5–51.0)
Hemoglobin: 16.4 g/dL (ref 13.0–17.7)
Immature Grans (Abs): 0 10*3/uL (ref 0.0–0.1)
Immature Granulocytes: 1 %
Lymphocytes Absolute: 1.3 10*3/uL (ref 0.7–3.1)
Lymphs: 28 %
MCH: 29.9 pg (ref 26.6–33.0)
MCHC: 33.5 g/dL (ref 31.5–35.7)
MCV: 89 fL (ref 79–97)
Monocytes Absolute: 0.3 10*3/uL (ref 0.1–0.9)
Monocytes: 7 %
Neutrophils Absolute: 3 10*3/uL (ref 1.4–7.0)
Neutrophils: 61 %
Platelets: 124 10*3/uL — ABNORMAL LOW (ref 150–450)
RBC: 5.48 x10E6/uL (ref 4.14–5.80)
RDW: 13.2 % (ref 11.6–15.4)
WBC: 4.9 10*3/uL (ref 3.4–10.8)

## 2023-05-21 LAB — CMP14+EGFR
ALT: 39 IU/L (ref 0–44)
AST: 28 IU/L (ref 0–40)
Albumin: 4.5 g/dL (ref 3.8–4.9)
Alkaline Phosphatase: 60 IU/L (ref 44–121)
BUN/Creatinine Ratio: 14 (ref 9–20)
BUN: 18 mg/dL (ref 6–24)
Bilirubin Total: 0.4 mg/dL (ref 0.0–1.2)
CO2: 23 mmol/L (ref 20–29)
Calcium: 9.3 mg/dL (ref 8.7–10.2)
Chloride: 103 mmol/L (ref 96–106)
Creatinine, Ser: 1.3 mg/dL — ABNORMAL HIGH (ref 0.76–1.27)
Globulin, Total: 1.7 g/dL (ref 1.5–4.5)
Glucose: 107 mg/dL — ABNORMAL HIGH (ref 70–99)
Potassium: 4.7 mmol/L (ref 3.5–5.2)
Sodium: 140 mmol/L (ref 134–144)
Total Protein: 6.2 g/dL (ref 6.0–8.5)
eGFR: 65 mL/min/{1.73_m2} (ref >59)

## 2023-05-21 LAB — TESTOSTERONE,FREE AND TOTAL
Testosterone, Free: 3.6 pg/mL — ABNORMAL LOW (ref 7.2–24.0)
Testosterone: 136 ng/dL — ABNORMAL LOW (ref 264–916)

## 2023-05-23 ENCOUNTER — Encounter: Payer: Self-pay | Admitting: Family Medicine

## 2023-06-03 ENCOUNTER — Encounter: Payer: Self-pay | Admitting: *Deleted

## 2023-06-07 ENCOUNTER — Other Ambulatory Visit: Payer: Self-pay | Admitting: Family Medicine

## 2023-07-07 ENCOUNTER — Other Ambulatory Visit: Payer: Self-pay | Admitting: Family Medicine

## 2023-08-09 ENCOUNTER — Other Ambulatory Visit: Payer: Self-pay | Admitting: Family Medicine

## 2023-08-09 NOTE — Telephone Encounter (Signed)
 Pt saw you 05/20/2023 and you told him to follow up in 6 months for CPE-ok to fill till his appt with you?

## 2023-08-11 ENCOUNTER — Other Ambulatory Visit: Payer: Self-pay | Admitting: Family Medicine

## 2023-08-18 ENCOUNTER — Other Ambulatory Visit: Payer: Self-pay | Admitting: Family Medicine

## 2023-08-19 NOTE — Telephone Encounter (Signed)
Authorize 30 days only. Then contact the patient letting them know that they will need an appointment before any further prescriptions can be sent in. 

## 2023-10-06 ENCOUNTER — Other Ambulatory Visit: Payer: Self-pay | Admitting: Family Medicine

## 2023-11-04 ENCOUNTER — Other Ambulatory Visit: Payer: Self-pay | Admitting: Family Medicine

## 2023-11-04 DIAGNOSIS — M25531 Pain in right wrist: Secondary | ICD-10-CM

## 2023-11-15 ENCOUNTER — Other Ambulatory Visit: Payer: Self-pay | Admitting: Family Medicine

## 2023-11-19 ENCOUNTER — Other Ambulatory Visit: Payer: Self-pay

## 2023-11-19 NOTE — Telephone Encounter (Signed)
 Pt will run out of medication this weekend Originally had appt sched for 11/22/23, I had to resched appt for 12/08/23 when PCP was to be out for an ext amount of time but then recently cancelled this leave Please advise on refill.

## 2023-11-19 NOTE — Addendum Note (Signed)
 Addended by: Johnye Kist D on: 11/19/2023 04:05 PM   Modules accepted: Orders

## 2023-11-19 NOTE — Telephone Encounter (Signed)
 Copied from CRM [id number]. Topic: Clinical - Prescription Issue >> Nov 19, 2023  2:43 PM Ivette P wrote: Reason for CRM: Pt app was rescheduled for 11/05 and pt will run out of medication - Suvorexant  (BELSOMRA ) 20 MG TABS this weekend and pt will need a refill of medication to hold over until November.   Pls follow up with pt

## 2023-11-22 ENCOUNTER — Encounter: Admitting: Family Medicine

## 2023-11-22 MED ORDER — BELSOMRA 20 MG PO TABS: 20.0000 mg | ORAL_TABLET | Freq: Every evening | ORAL | 1 refills | Status: AC | PRN

## 2023-12-05 ENCOUNTER — Other Ambulatory Visit: Payer: Self-pay | Admitting: Family Medicine

## 2023-12-08 ENCOUNTER — Ambulatory Visit: Payer: Self-pay | Admitting: Family Medicine

## 2023-12-08 ENCOUNTER — Encounter: Payer: Self-pay | Admitting: Family Medicine

## 2023-12-08 ENCOUNTER — Ambulatory Visit (INDEPENDENT_AMBULATORY_CARE_PROVIDER_SITE_OTHER): Payer: Self-pay | Admitting: Family Medicine

## 2023-12-08 VITALS — BP 149/79 | HR 71 | Temp 97.3°F | Ht 69.0 in | Wt 277.0 lb

## 2023-12-08 DIAGNOSIS — K760 Fatty (change of) liver, not elsewhere classified: Secondary | ICD-10-CM

## 2023-12-08 DIAGNOSIS — I471 Supraventricular tachycardia, unspecified: Secondary | ICD-10-CM

## 2023-12-08 DIAGNOSIS — R002 Palpitations: Secondary | ICD-10-CM | POA: Diagnosis not present

## 2023-12-08 DIAGNOSIS — R161 Splenomegaly, not elsewhere classified: Secondary | ICD-10-CM

## 2023-12-08 DIAGNOSIS — F5101 Primary insomnia: Secondary | ICD-10-CM

## 2023-12-08 DIAGNOSIS — I10 Essential (primary) hypertension: Secondary | ICD-10-CM | POA: Diagnosis not present

## 2023-12-08 DIAGNOSIS — E349 Endocrine disorder, unspecified: Secondary | ICD-10-CM | POA: Diagnosis not present

## 2023-12-08 DIAGNOSIS — E782 Mixed hyperlipidemia: Secondary | ICD-10-CM | POA: Diagnosis not present

## 2023-12-08 DIAGNOSIS — Z0001 Encounter for general adult medical examination with abnormal findings: Secondary | ICD-10-CM | POA: Diagnosis not present

## 2023-12-08 DIAGNOSIS — R1011 Right upper quadrant pain: Secondary | ICD-10-CM

## 2023-12-08 DIAGNOSIS — Z0000 Encounter for general adult medical examination without abnormal findings: Secondary | ICD-10-CM

## 2023-12-08 DIAGNOSIS — R350 Frequency of micturition: Secondary | ICD-10-CM

## 2023-12-08 DIAGNOSIS — N401 Enlarged prostate with lower urinary tract symptoms: Secondary | ICD-10-CM

## 2023-12-08 DIAGNOSIS — R739 Hyperglycemia, unspecified: Secondary | ICD-10-CM

## 2023-12-08 LAB — URINALYSIS
Bilirubin, UA: NEGATIVE
Glucose, UA: NEGATIVE
Ketones, UA: NEGATIVE
Leukocytes,UA: NEGATIVE
Nitrite, UA: NEGATIVE
Protein,UA: NEGATIVE
RBC, UA: NEGATIVE
Specific Gravity, UA: 1.02 (ref 1.005–1.030)
Urobilinogen, Ur: 0.2 mg/dL (ref 0.2–1.0)
pH, UA: 7.5 (ref 5.0–7.5)

## 2023-12-08 LAB — BAYER DCA HB A1C WAIVED: HB A1C (BAYER DCA - WAIVED): 5.4 % (ref 4.8–5.6)

## 2023-12-08 MED ORDER — TRIAMCINOLONE ACETONIDE 0.1 % EX CREA: 1.0000 | TOPICAL_CREAM | Freq: Three times a day (TID) | CUTANEOUS | 0 refills | Status: AC

## 2023-12-08 MED ORDER — NEBIVOLOL HCL 20 MG PO TABS: 20.0000 mg | ORAL_TABLET | Freq: Every day | ORAL | 1 refills | Status: AC

## 2023-12-08 MED ORDER — FEXOFENADINE HCL 180 MG PO TABS
180.0000 mg | ORAL_TABLET | Freq: Every day | ORAL | 3 refills | Status: AC
Start: 2023-12-08 — End: ?

## 2023-12-08 MED ORDER — TADALAFIL 5 MG PO TABS: 5.0000 mg | ORAL_TABLET | Freq: Every day | ORAL | Status: AC

## 2023-12-08 MED ORDER — TESTOSTERONE 30 MG/ACT TD SOLN: TRANSDERMAL | 0 refills | Status: AC

## 2023-12-08 MED ORDER — ESOMEPRAZOLE MAGNESIUM 20 MG PO CPDR: 20.0000 mg | DELAYED_RELEASE_CAPSULE | Freq: Two times a day (BID) | ORAL | 0 refills | Status: AC

## 2023-12-08 MED ORDER — TRAZODONE HCL 150 MG PO TABS: 150.0000 mg | ORAL_TABLET | Freq: Every day | ORAL | 1 refills | Status: AC

## 2023-12-08 NOTE — Addendum Note (Signed)
 Addended by: ZOLLIE LOWERS on: 12/08/2023 08:53 AM   Modules accepted: Orders, Level of Service

## 2023-12-08 NOTE — Progress Notes (Signed)
 SABRA

## 2023-12-08 NOTE — Progress Notes (Addendum)
 Subjective:  Patient ID: Roy Lawrence, male    DOB: 1970/09/13  Age: 53 y.o. MRN: [id number]  CC: Annual Exam (No concerns at this time.) and Abdominal Pain (LLQ/Pain with bending over. Dicyclomine  not helping at all. Pain never goes away. )   HPI  Discussed the use of AI scribe software for clinical note transcription with the patient, who gave verbal consent to proceed.  History of Present Illness   Here for annual exam. Cancerned for LLQ pain that is ongoing for months. Occurs with bending over. Had CT with Roy Lawrence that showed diverticulosis. No relief with Bentyl . No diarrhea or constipation. Denies melena or blood in stool.   Continues to take testosterone . Also using cialis  daily. Both work well for him. Able to have erections as desired. No excessive frequency, etc due to BPH.   He has noted palpitations up to 185  BPM. Usually around 120. Occurring daily. Also BP remaining elevated   presents for  follow-up of hypertension. Patient has no history of headache chest pain or shortness of breath or recent cough. Patient also denies symptoms of TIA such as focal numbness or weakness. Patient denies side effects from medication. States taking it regularly.   in for follow-up of elevated cholesterol. Doing well without complaints on current medication. Denies side effects of statin including myalgia and arthralgia and nausea. Currently no chest pain, shortness of breath or other cardiovascular related symptoms noted.       12/08/2023    8:02 AM 05/20/2023    8:19 AM 11/19/2022    9:01 AM  Depression screen PHQ 2/9  Decreased Interest 0 0 0  Down, Depressed, Hopeless 0 1 0  PHQ - 2 Score 0 1 0  Altered sleeping 3 3 3   Tired, decreased energy 3 0 3  Change in appetite 2 0 1  Feeling bad or failure about yourself  2 0 0  Trouble concentrating 1 1 1   Moving slowly or fidgety/restless 1 1 0  Suicidal thoughts 0 0 0  PHQ-9 Score 12 6 8   Difficult doing work/chores Somewhat  difficult Somewhat difficult Somewhat difficult    History [name] has a past medical history of Allergy, Anxiety, Asthma, Depression, Diverticulitis, Diverticulosis, Esophageal stricture, GERD (gastroesophageal reflux disease), Hypertension, Obesity, Sleep apnea, and Spondylosis.   He has a past surgical history that includes Shoulder surgery (Right); Finger surgery (Right); and Esophageal dilation.   His family history includes Asthma in his mother; Cancer in his father; Dementia in his maternal grandfather and maternal grandmother; Diabetes in his father; Diverticulitis in his mother; GER disease in his mother; Yvone' disease in his father; Hodgkin's lymphoma in his father; Hyperlipidemia in his mother; Hypertension in his maternal grandfather, maternal grandmother, and mother; Liver cancer in his father; Thyroid disease in his father and sister.He reports that he has never smoked. He has never used smokeless tobacco. He reports current alcohol use. He reports that he does not use drugs.    ROS Review of Systems  Constitutional:  Negative for activity change, fatigue and unexpected weight change.  HENT:  Negative for congestion, ear pain, hearing loss, postnasal drip and trouble swallowing.   Eyes:  Negative for pain and visual disturbance.  Respiratory:  Negative for cough, chest tightness and shortness of breath.   Cardiovascular:  Negative for chest pain, palpitations and leg swelling.  Gastrointestinal:  Positive for abdominal pain. Negative for abdominal distention, blood in stool, constipation, diarrhea, nausea and vomiting.  Endocrine: Negative for  cold intolerance, heat intolerance and polydipsia.  Genitourinary:  Negative for difficulty urinating, dysuria, flank pain, frequency and urgency.  Musculoskeletal:  Positive for arthralgias (left knee pain, had injection several months ago.). Negative for joint swelling.  Skin:  Positive for rash (chest has bumps on sternum , like acne).  Negative for color change and wound.  Neurological:  Negative for dizziness, syncope, speech difficulty, weakness, light-headedness, numbness and headaches.  Hematological:  Does not bruise/bleed easily.  Psychiatric/Behavioral:  Negative for confusion, decreased concentration, dysphoric mood and sleep disturbance. The patient is not nervous/anxious.     Objective:  BP (!) 149/79   Pulse 71   Temp (!) 97.3 F (36.3 C)   Ht 5' 9 (1.753 m)   Wt 277 lb (125.6 kg)   SpO2 93%   BMI 40.91 kg/m   BP Readings from Last 3 Encounters:  12/08/23 (!) 149/79  05/20/23 131/79  11/19/22 124/72    Wt Readings from Last 3 Encounters:  12/08/23 277 lb (125.6 kg)  05/20/23 266 lb (120.7 kg)  11/19/22 269 lb 6.4 oz (122.2 kg)     Physical Exam Constitutional:      Appearance: He is well-developed.  HENT:     Head: Normocephalic and atraumatic.  Eyes:     Pupils: Pupils are equal, round, and reactive to light.  Neck:     Thyroid: No thyromegaly.     Trachea: No tracheal deviation.  Cardiovascular:     Rate and Rhythm: Normal rate and regular rhythm.     Heart sounds: Normal heart sounds. No murmur heard.    No friction rub. No gallop.  Pulmonary:     Breath sounds: Normal breath sounds. No wheezing or rales.  Abdominal:     General: Bowel sounds are normal. There is no distension.     Palpations: Abdomen is soft. There is no mass.     Tenderness: There is no abdominal tenderness.     Hernia: There is no hernia in the left inguinal area.  Genitourinary:    Penis: Normal.      Testes: Normal.  Musculoskeletal:        General: Normal range of motion.     Cervical back: Normal range of motion.  Lymphadenopathy:     Cervical: No cervical adenopathy.  Skin:    General: Skin is warm and dry.     Findings: Rash (few scattered papules on central chest) present.  Neurological:     Mental Status: He is alert and oriented to person, place, and time.    Physical  Exam    Assessment & Plan:  Well adult exam -     Urinalysis  Mixed hyperlipidemia -     Comprehensive metabolic panel with GFR -     Lipid panel  Testosterone  deficiency -     Testosterone ,Free and Total  Primary hypertension -     CBC with Differential/Platelet -     Comprehensive metabolic panel with GFR -     Urinalysis  SVT (supraventricular tachycardia) -     EKG 12-Lead  Palpitations -     Urinalysis -     EKG 12-Lead  Primary insomnia  Benign prostatic hyperplasia with urinary frequency -     PSA, total and free  Right upper quadrant abdominal pain -     US  Abdomen Complete; Future  Elevated serum glucose -     Bayer DCA Hb A1c Waived  Other orders -     Testosterone ; apply  2 pumps TO each ARM ONCE DAILY  Dispense: 180 mL; Refill: 0 -     Fexofenadine  HCl; Take 1 tablet (180 mg total) by mouth daily. For allergy symptoms  Dispense: 90 tablet; Refill: 3 -     traZODone  HCl; Take 1 tablet (150 mg total) by mouth at bedtime.  Dispense: 90 tablet; Refill: 1 -     Tadalafil ; Take 1 tablet (5 mg total) by mouth daily.  Dispense: 90 tablet; Refill: 03 -     Nebivolol  HCl; Take 1 tablet (20 mg total) by mouth daily.  Dispense: 90 tablet; Refill: 1 -     Esomeprazole  Magnesium ; Take 1 capsule (20 mg total) by mouth 2 (two) times daily before a meal.  Dispense: 360 capsule; Refill: 0 -     Triamcinolone Acetonide; Apply 1 Application topically 3 (three) times daily. Avoid face and genitalia  Dispense: 45 g; Refill: 0  .apsec  Assessment and Plan Assessment & Plan        Follow-up: Return in about 6 months (around 06/06/2024).  Butler Der, M.D.

## 2023-12-10 LAB — COMPREHENSIVE METABOLIC PANEL WITH GFR
ALT: 67 IU/L — ABNORMAL HIGH (ref 0–44)
AST: 34 IU/L (ref 0–40)
Albumin: 4.6 g/dL (ref 3.8–4.9)
Alkaline Phosphatase: 58 IU/L (ref 47–123)
BUN/Creatinine Ratio: 13 (ref 9–20)
BUN: 20 mg/dL (ref 6–24)
Bilirubin Total: 0.3 mg/dL (ref 0.0–1.2)
CO2: 26 mmol/L (ref 20–29)
Calcium: 9.5 mg/dL (ref 8.7–10.2)
Chloride: 100 mmol/L (ref 96–106)
Creatinine, Ser: 1.55 mg/dL — ABNORMAL HIGH (ref 0.76–1.27)
Globulin, Total: 2 g/dL (ref 1.5–4.5)
Glucose: 102 mg/dL — ABNORMAL HIGH (ref 70–99)
Potassium: 4.9 mmol/L (ref 3.5–5.2)
Sodium: 140 mmol/L (ref 134–144)
Total Protein: 6.6 g/dL (ref 6.0–8.5)
eGFR: 53 mL/min/1.73 — ABNORMAL LOW (ref >59)

## 2023-12-10 LAB — CBC WITH DIFFERENTIAL/PLATELET
Basophils Absolute: 0.1 x10E3/uL (ref 0.0–0.2)
Basos: 1 %
EOS (ABSOLUTE): 0.1 x10E3/uL (ref 0.0–0.4)
Eos: 1 %
Hematocrit: 51.5 % — ABNORMAL HIGH (ref 37.5–51.0)
Hemoglobin: 16.3 g/dL (ref 13.0–17.7)
Immature Grans (Abs): 0 x10E3/uL (ref 0.0–0.1)
Immature Granulocytes: 0 %
Lymphocytes Absolute: 1.4 x10E3/uL (ref 0.7–3.1)
Lymphs: 20 %
MCH: 27.7 pg (ref 26.6–33.0)
MCHC: 31.7 g/dL (ref 31.5–35.7)
MCV: 88 fL (ref 79–97)
Monocytes Absolute: 0.5 x10E3/uL (ref 0.1–0.9)
Monocytes: 7 %
Neutrophils Absolute: 4.9 x10E3/uL (ref 1.4–7.0)
Neutrophils: 70 %
Platelets: 132 x10E3/uL — ABNORMAL LOW (ref 150–450)
RBC: 5.88 x10E6/uL — ABNORMAL HIGH (ref 4.14–5.80)
RDW: 13.2 % (ref 11.6–15.4)
WBC: 6.9 x10E3/uL (ref 3.4–10.8)

## 2023-12-10 LAB — PSA, TOTAL AND FREE
PSA, Free Pct: 25 %
PSA, Free: 0.45 ng/mL
Prostate Specific Ag, Serum: 1.8 ng/mL (ref 0.0–4.0)

## 2023-12-10 LAB — TESTOSTERONE,FREE AND TOTAL
Testosterone, Free: 39.2 pg/mL — AB (ref 7.2–24.0)
Testosterone: >1500 ng/dL — AB (ref 264–916)

## 2023-12-10 LAB — LIPID PANEL
Chol/HDL Ratio: 5.4 ratio — ABNORMAL HIGH (ref 0.0–5.0)
Cholesterol, Total: 196 mg/dL (ref 100–199)
HDL: 36 mg/dL — ABNORMAL LOW (ref >39)
LDL Chol Calc (NIH): 138 mg/dL — ABNORMAL HIGH (ref 0–99)
Triglycerides: 118 mg/dL (ref 0–149)
VLDL Cholesterol Cal: 22 mg/dL (ref 5–40)

## 2023-12-21 ENCOUNTER — Ambulatory Visit (HOSPITAL_COMMUNITY)
Admission: RE | Admit: 2023-12-21 | Discharge: 2023-12-21 | Disposition: A | Discharge status: Home / Self Care | Source: Ambulatory Visit | Attending: Family Medicine | Admitting: Family Medicine

## 2023-12-21 DIAGNOSIS — R1011 Right upper quadrant pain: Secondary | ICD-10-CM | POA: Insufficient documentation

## 2023-12-28 ENCOUNTER — Other Ambulatory Visit (INDEPENDENT_AMBULATORY_CARE_PROVIDER_SITE_OTHER)

## 2023-12-28 ENCOUNTER — Ambulatory Visit (INDEPENDENT_AMBULATORY_CARE_PROVIDER_SITE_OTHER): Admitting: Nurse Practitioner

## 2023-12-28 ENCOUNTER — Encounter: Payer: Self-pay | Admitting: Nurse Practitioner

## 2023-12-28 ENCOUNTER — Ambulatory Visit: Payer: Self-pay | Admitting: Nurse Practitioner

## 2023-12-28 VITALS — BP 134/90 | HR 67 | Ht 69.0 in | Wt 274.4 lb

## 2023-12-28 DIAGNOSIS — K760 Fatty (change of) liver, not elsewhere classified: Secondary | ICD-10-CM | POA: Diagnosis not present

## 2023-12-28 DIAGNOSIS — R1011 Right upper quadrant pain: Secondary | ICD-10-CM | POA: Diagnosis not present

## 2023-12-28 DIAGNOSIS — R1032 Left lower quadrant pain: Secondary | ICD-10-CM | POA: Diagnosis not present

## 2023-12-28 DIAGNOSIS — R161 Splenomegaly, not elsewhere classified: Secondary | ICD-10-CM

## 2023-12-28 DIAGNOSIS — R7401 Elevation of levels of liver transaminase levels: Secondary | ICD-10-CM

## 2023-12-28 LAB — CBC
HCT: 50.3 % (ref 39.0–52.0)
Hemoglobin: 16.6 g/dL (ref 13.0–17.0)
MCHC: 33 g/dL (ref 30.0–36.0)
MCV: 83.3 fl (ref 78.0–100.0)
Platelets: 129 K/uL — ABNORMAL LOW (ref 150.0–400.0)
RBC: 6.04 Mil/uL — ABNORMAL HIGH (ref 4.22–5.81)
RDW: 14.2 % (ref 11.5–15.5)
WBC: 8 K/uL (ref 4.0–10.5)

## 2023-12-28 LAB — COMPREHENSIVE METABOLIC PANEL WITH GFR
ALT: 58 U/L — ABNORMAL HIGH (ref 0–53)
AST: 28 U/L (ref 0–37)
Albumin: 4.7 g/dL (ref 3.5–5.2)
Alkaline Phosphatase: 48 U/L (ref 39–117)
BUN: 17 mg/dL (ref 6–23)
CO2: 31 meq/L (ref 19–32)
Calcium: 9.2 mg/dL (ref 8.4–10.5)
Chloride: 102 meq/L (ref 96–112)
Creatinine, Ser: 1.33 mg/dL (ref 0.40–1.50)
GFR: 60.07 mL/min (ref >60.00)
Glucose, Bld: 85 mg/dL (ref 70–99)
Potassium: 4.2 meq/L (ref 3.5–5.1)
Sodium: 137 meq/L (ref 135–145)
Total Bilirubin: 0.5 mg/dL (ref 0.2–1.2)
Total Protein: 6.9 g/dL (ref 6.0–8.3)

## 2023-12-28 LAB — IRON: Iron: 56 ug/dL (ref 42–165)

## 2023-12-28 LAB — FERRITIN: Ferritin: 10.9 ng/mL — ABNORMAL LOW (ref 22.0–322.0)

## 2023-12-28 NOTE — Patient Instructions (Signed)
 Your provider has requested that you go to the basement level for lab work before leaving today. Press B on the elevator. The lab is located at the first door on the left as you exit the elevator.  You have been scheduled for a CT scan of the abdomen and pelvis at [facility], 1st floor Radiology. You are scheduled on 01/05/2024 at 4:00pm. You should arrive at 1:45pm registration and to drink the oral contrast.   You may take any medications as prescribed with a small amount of water, if necessary. If you take any of the following medications: METFORMIN, GLUCOPHAGE, GLUCOVANCE, AVANDAMET, RIOMET, FORTAMET, ACTOPLUS MET, JANUMET, GLUMETZA or METAGLIP, you MAY be asked to HOLD this medication 48 hours AFTER the exam.   Do not eat any solids after 12:00pm.  If you have any questions regarding your exam or if you need to reschedule, you may call [facility] at [phone] between the hours of 8:00 am and 5:00 pm, Monday-Friday.   _______________________________________________________  If your blood pressure at your visit was 140/90 or greater, please contact your primary care physician to follow up on this.  _______________________________________________________  If you are age 47 or older, your body mass index should be between 23-30. Your Body mass index is 40.52 kg/m. If this is out of the aforementioned range listed, please consider follow up with your Primary Care Provider.  If you are age 16 or younger, your body mass index should be between 19-25. Your Body mass index is 40.52 kg/m. If this is out of the aformentioned range listed, please consider follow up with your Primary Care Provider.   ________________________________________________________  The [facility] providers would like to encourage you to use MYCHART to communicate with providers for non-urgent requests or questions.  Due to long hold times on the telephone, sending your provider a message by [facility]  may be a faster and more efficient way to get a response.  Please allow 48 business hours for a response.  Please remember that this is for non-urgent requests.  _______________________________________________________  [facility] is using a team-based approach to care.  Your team is made up of your doctor and two to three APPS. Our APPS (Nurse Practitioners and Physician Assistants) work with your physician to ensure care continuity for you. They are fully qualified to address your health concerns and develop a treatment plan. They communicate directly with your gastroenterologist to care for you. Seeing the Advanced Practice Practitioners on your physician's team can help you by facilitating care more promptly, often allowing for earlier appointments, access to diagnostic testing, procedures, and other specialty referrals.

## 2023-12-28 NOTE — Progress Notes (Signed)
 12/28/2023 Roy Lawrence [id number] 04-26-1970   Chief Complaint: Abdominal pain, splenomegaly  History of Present Illness: Roy Lawrence is a 53 year old male with a past medical history of anxiety, depression, hypertension, SVT, asthma, sleep apnea, spondylosis, hepatic steatosis, GERD, diverticulitis and colon polyps.  He is known by Dr. Abran.    He presents to our office today as referred by Dr. Butler Der for further evaluation regarding hepatic steatosis and splenomegaly.  He is accompanied by his wife.  He endorses having RUQ pain which occurs intermittently for the past few months and intermittent LLQ pain which he attributes to having diverticulosis.  No severe LLQ pain at this time.  He was seen by his PCP 12/08/2023 and laboratory studies showed a BUN of 20, creatinine 1.55 up from 1.307 months ago, ALT 67 with normal total bili, alk phos and AST levels.  CBC with a WBC count of 6.9.  Hemoglobin 16.3.  Hematocrit 51.5.  Platelets 132.  He underwent an abdominal sonogram 12/21/2023 which showed evidence of hepatic steatosis and splenomegaly.  In review of his epic records, he has had mild thrombocytopenia since 12/2012.  The patient stated he was seen by hematologist many years ago due to having low platelet count and hematological workup was unrevealing.  He drinks 2 ounces of liquor a few times monthly.  No drug use.  He previously took diclofenac  twice daily which he stopped taking 1-1/2 weeks ago.  His father died from cholangiocarcinoma at the age of 13.  Father also had history of non-Hodgkin's lymphoma and melanoma.  He endorses passing normal brown formed stool most days.  No bloody or black stools.  He has intermittent LLQ pain as noted above.  No significant abdominal pain at this time.      Latest Ref Rng & Units 12/08/2023    9:14 AM 05/20/2023    8:58 AM 11/19/2022    9:53 AM  CBC  WBC 3.4 - 10.8 x10E3/uL 6.9  4.9  5.7   Hemoglobin 13.0 - 17.7 g/dL 83.6   83.5  83.7   Hematocrit 37.5 - 51.0 % 51.5  48.9  50.1   Platelets 150 - 450 x10E3/uL 132  124  124        Latest Ref Rng & Units 12/08/2023    9:14 AM 05/20/2023    8:58 AM 11/19/2022    9:53 AM  CMP  Glucose 70 - 99 mg/dL 897  892  896   BUN 6 - 24 mg/dL 20  18  19    Creatinine 0.76 - 1.27 mg/dL 8.44  8.69  8.62   Sodium 134 - 144 mmol/L 140  140  140   Potassium 3.5 - 5.2 mmol/L 4.9  4.7  4.7   Chloride 96 - 106 mmol/L 100  103  102   CO2 20 - 29 mmol/L 26  23  24    Calcium 8.7 - 10.2 mg/dL 9.5  9.3  9.5   Total Protein 6.0 - 8.5 g/dL 6.6  6.2  6.2   Total Bilirubin 0.0 - 1.2 mg/dL 0.3  0.4  0.4   Alkaline Phos 47 - 123 IU/L 58  60  58   AST 0 - 40 IU/L 34  28  27   ALT 0 - 44 IU/L 67  39  44    Abdominal sonogram 12/21/2023: FINDINGS: Gallbladder: No gallstones or wall thickening visualized. No sonographic Murphy sign noted by sonographer.   Common bile duct:  Diameter: Visualized portion measures 4 mm, within normal limits.   Liver: No definitive focal lesion identified. Diffusely increased in parenchymal echogenicity with poor acoustic penetration. Portal vein is patent on color Doppler imaging with normal direction of blood flow towards the liver.   IVC: No abnormality visualized.   Pancreas: Visualized portion unremarkable.   Spleen: Spleen is enlarged spanning 15.7 x 15.5 x 5.7 cm for an estimated volume of 721 ML.   Right Kidney: Length: 10.6 cm. Echogenicity within normal limits. No mass or hydronephrosis visualized.   Left Kidney: Length: 12.3 cm. Echogenicity within normal limits. No hydronephrosis visualized. Benign cyst is noted measuring 12 mm (for which no dedicated imaging follow-up is recommended).   Abdominal aorta: No aneurysm visualized.   Other findings: None.   IMPRESSION: 1. Hepatic steatosis. 2. Splenomegaly.  GI PROCEDURES:    Colonoscopy 02/17/2021 Dr. Abran: - One 1 mm polyp in the rectum, removed with a cold biopsy forceps.  Resected and retrieved.  - Two 2 to 3 mm polyps in the transverse colon, removed with a cold snare. Resected and retrieved.  - Diverticulosis in the entire examined colon. -  Internal hemorrhoids  - 5 year recall colonoscopy  1. Surgical [P], colon, transverse, polyp (2) TUBULAR ADENOMAS NEGATIVE FOR HIGH-GRADE DYSPLASIA AND CARCINOMA 2. Surgical [P], colon, rectum, polyp (1) TUBULAR ADENOMA NEGATIVE FOR HIGH-GRADE DYSPLASIA AND CARCINOMA  Colonoscopy 02/20/2013: Diverticulosis throughout the entire examined colon No polyps Recall colonoscopy 10 years  Past Medical History:  Diagnosis Date   Allergy    SEASONAL   Anxiety    Asthma    Depression    Diverticulitis    Diverticulosis    Esophageal stricture    GERD (gastroesophageal reflux disease)    Hypertension    Prehypertension   Obesity    Sleep apnea    Spondylosis    Past Surgical History:  Procedure Laterality Date   ESOPHAGEAL DILATION     FINGER SURGERY Right    3rd digit; imbedded glass   SHOULDER SURGERY Right    x 2   Current Outpatient Medications on File Prior to Visit  Medication Sig Dispense Refill   albuterol  (VENTOLIN  HFA) 108 (90 Base) MCG/ACT inhaler Inhale 1 puff into the lungs every 6 (six) hours as needed for wheezing. 18 each 3   ascorbic acid (VITAMIN C) 500 MG tablet Take 500 mg by mouth daily.     BREO ELLIPTA  200-25 MCG/ACT AEPB INHALE 1 PUFF INTO THE LUNGS DAILY 60 each 0   Cholecalciferol (VITAMIN D3) 5000 UNITS CAPS Take 5,000 Units by mouth 2 (two) times daily.     diclofenac  (VOLTAREN ) 75 MG EC tablet TAKE 1 TABLET BY MOUTH TWICE DAILY DAILY FOR muscle and joint pain 180 tablet 0   dicyclomine  (BENTYL ) 20 MG tablet TAKE 1 TABLET BY MOUTH THREE TIMES DAILY BEFORE MEALS 270 tablet 1   esomeprazole  (NEXIUM  24HR) 20 MG capsule Take 1 capsule (20 mg total) by mouth 2 (two) times daily before a meal. 360 capsule 0   fexofenadine  (ALLEGRA ) 180 MG tablet Take 1 tablet (180 mg total) by mouth daily.  For allergy symptoms 90 tablet 3   Magnesium  Oxide (MAG-OX PO) Take 1 tablet by mouth daily.      nebivolol  20 MG TABS Take 1 tablet (20 mg total) by mouth daily. 90 tablet 1   Psyllium 30.9 % POWD Drink 1 tablespoon dissolved in water, twice Daily  11   rizatriptan  (MAXALT -MLT) 10 MG disintegrating tablet  TAKE 1 TABLET BY MOUTH AS NEEDED FOR MIGRAINE - MAY REPEAT IN 2 HOURS IF NEEDED. 10 tablet 0   Suvorexant  (BELSOMRA ) 20 MG TABS Take 1 tablet (20 mg total) by mouth at bedtime as needed. 90 tablet 1   tadalafil  (CIALIS ) 5 MG tablet Take 1 tablet (5 mg total) by mouth daily. 90 tablet 03   Testosterone  30 MG/ACT SOLN apply 2 pumps TO each ARM ONCE DAILY 180 mL 0   traZODone  (DESYREL ) 150 MG tablet Take 1 tablet (150 mg total) by mouth at bedtime. 90 tablet 1   triamcinolone  cream (KENALOG ) 0.1 % Apply 1 Application topically 3 (three) times daily. Avoid face and genitalia 45 g 0   No current facility-administered medications on file prior to visit.   Allergies  Allergen Reactions   Diovan [Valsartan] Swelling   Quinapril Hcl Swelling   Quinapril Hcl    Current Medications, Allergies, Past Medical History, Past Surgical History, Family History and Social History were reviewed in [employer] record.  Review of Systems:   Constitutional: Negative for fever, sweats, chills or weight loss.  Respiratory: Negative for shortness of breath.   Cardiovascular: Negative for chest pain, palpitations and leg swelling.  Gastrointestinal: See HPI.  Musculoskeletal: Negative for back pain or muscle aches.  Neurological: Negative for dizziness, headaches or paresthesias.   Physical Exam: BP (!) 134/90   Pulse 67   Ht 5' 9 (1.753 m)   Wt 274 lb 6.4 oz (124.5 kg)   BMI 40.52 kg/m  General: 53 year old male in no acute distress in no acute distress. Head: Normocephalic and atraumatic. Eyes: No scleral icterus. Conjunctiva pink . Ears: Normal auditory acuity. Mouth:  Dentition intact. No ulcers or lesions.  Lungs: Clear throughout to auscultation. Heart: Regular rate and rhythm, no murmur. Abdomen: Soft, nondistended.  Mild RUQ tenderness and mild tenderness throughout the lower abdomen without rebound or guarding.  No masses or hepatomegaly. Normal bowel sounds x 4 quadrants.  Rectal: Deferred. Musculoskeletal: Symmetrical with no gross deformities. Extremities: No edema. Neurological: Alert oriented x 4. No focal deficits.  Psychological: Alert and cooperative. Normal mood and affect  Assessment and Recommendations:  53 year old male with hepatic steatosis and splenomegaly per abdominal sonogram with mild thrombocytopenia and elevated ALT levels concerning for advanced liver disease/cirrhosis. - Abdominal/pelvic CT with oral and IV contrast to further evaluate the liver, biliary tract and spleen - Check BUNs/creatinine prior to the patient receiving IV contrast - Avoid Diclofenac  and other NSAIDs - CBC, CMP, hepatitis A total antibody, hepatitis B surface antigen, hepatitis B surface antibody, hepatitis B core total antibody, hepatitis C antibody, ANA, SMA, AMA, IgG, alpha-1 antitrypsin level, ceruloplasmin, iron and ferritin - Further recommendations to be determined after the above lab results and CT results reviewed - Reduce carbohydrates in diet ( ie: reduce bread, potato, rice and sweets. Exercise as tolerated to facilitate weight loss.  History of diverticulitis with pandiverticulosis.  Intermittent LLQ pain likely secondary to diverticular disease. - CTAP as ordered above to also rule out diverticulitis  History of colon polyps.  Colonoscopy 02/2021 identified 3 tubular adenomatous polyps removed from the colon. - Next colon polyp surveillance colonoscopy due 02/2026  GERD - Continue Esomeprazole  20 mg twice daily  RUQ pain - Await the above lab and CTAP results - Consider EGD if RUQ pain persists - Continue esomeprazole  20 mg twice  daily

## 2023-12-29 NOTE — Progress Notes (Signed)
 If no obvious significant liver disease on workup, he should be referred back to hematology for splenomegaly and low platelets. Thanks Dr. Abran

## 2024-01-03 LAB — HEPATITIS B SURFACE ANTIGEN: Hepatitis B Surface Ag: NONREACTIVE

## 2024-01-03 LAB — HEPATITIS B CORE ANTIBODY, IGM: Hep B C IgM: NONREACTIVE

## 2024-01-03 LAB — CERULOPLASMIN: Ceruloplasmin: 22 mg/dL (ref 14–30)

## 2024-01-03 LAB — ALPHA-1-ANTITRYPSIN: A-1 Antitrypsin, Ser: 112 mg/dL (ref 83–199)

## 2024-01-03 LAB — HEPATITIS B SURFACE ANTIBODY,QUALITATIVE: Hep B S Ab: REACTIVE — AB

## 2024-01-03 LAB — HEPATITIS C ANTIBODY: Hepatitis C Ab: NONREACTIVE

## 2024-01-03 LAB — IGG: IgG (Immunoglobin G), Serum: 781 mg/dL (ref 600–1640)

## 2024-01-03 LAB — ANA: Anti Nuclear Antibody (ANA): NEGATIVE

## 2024-01-03 LAB — ANTI-SMOOTH MUSCLE ANTIBODY, IGG: Actin (Smooth Muscle) Antibody (IGG): <20 U (ref <20)

## 2024-01-03 LAB — MITOCHONDRIAL ANTIBODIES: Mitochondrial M2 Ab, IgG: <=20 U (ref <=20.0)

## 2024-01-03 LAB — HEPATITIS A ANTIBODY, TOTAL: Hepatitis A AB,Total: REACTIVE — AB

## 2024-01-05 ENCOUNTER — Ambulatory Visit (HOSPITAL_COMMUNITY): Admission: RE | Admit: 2024-01-05 | Discharge: 2024-01-05 | Attending: Nurse Practitioner

## 2024-01-05 DIAGNOSIS — R1011 Right upper quadrant pain: Secondary | ICD-10-CM | POA: Insufficient documentation

## 2024-01-05 DIAGNOSIS — R1032 Left lower quadrant pain: Secondary | ICD-10-CM | POA: Diagnosis present

## 2024-01-05 DIAGNOSIS — R161 Splenomegaly, not elsewhere classified: Secondary | ICD-10-CM | POA: Diagnosis present

## 2024-01-05 DIAGNOSIS — K760 Fatty (change of) liver, not elsewhere classified: Secondary | ICD-10-CM | POA: Diagnosis present

## 2024-01-05 DIAGNOSIS — R7401 Elevation of levels of liver transaminase levels: Secondary | ICD-10-CM | POA: Diagnosis present

## 2024-01-05 MED ORDER — SODIUM CHLORIDE (PF) 0.9 % IJ SOLN
INTRAMUSCULAR | Status: AC
  Filled 2024-01-05: qty 50

## 2024-01-05 MED ORDER — IOHEXOL 9 MG/ML PO SOLN
500.0000 mL | ORAL | Status: AC
  Administered 2024-01-05 (×2): 500 mL via ORAL

## 2024-01-05 MED ORDER — IOHEXOL 300 MG/ML  SOLN
100.0000 mL | Freq: Once | INTRAMUSCULAR | Status: AC | PRN
  Administered 2024-01-05: 100 mL via INTRAVENOUS

## 2024-02-11 ENCOUNTER — Ambulatory Visit (HOSPITAL_COMMUNITY)
Admission: RE | Admit: 2024-02-11 | Discharge: 2024-02-11 | Disposition: A | Source: Ambulatory Visit | Attending: Nurse Practitioner

## 2024-02-11 DIAGNOSIS — K760 Fatty (change of) liver, not elsewhere classified: Secondary | ICD-10-CM | POA: Diagnosis present

## 2024-02-11 DIAGNOSIS — R161 Splenomegaly, not elsewhere classified: Secondary | ICD-10-CM | POA: Diagnosis present

## 2024-02-11 DIAGNOSIS — R1011 Right upper quadrant pain: Secondary | ICD-10-CM | POA: Diagnosis present

## 2024-02-16 ENCOUNTER — Encounter: Payer: Self-pay | Admitting: Internal Medicine

## 2024-02-16 ENCOUNTER — Ambulatory Visit

## 2024-02-16 ENCOUNTER — Ambulatory Visit: Payer: Self-pay | Admitting: Nurse Practitioner

## 2024-02-16 VITALS — Ht 69.0 in | Wt 264.0 lb

## 2024-02-16 DIAGNOSIS — R161 Splenomegaly, not elsewhere classified: Secondary | ICD-10-CM

## 2024-02-16 DIAGNOSIS — R1011 Right upper quadrant pain: Secondary | ICD-10-CM

## 2024-02-16 DIAGNOSIS — D509 Iron deficiency anemia, unspecified: Secondary | ICD-10-CM

## 2024-02-16 DIAGNOSIS — K766 Portal hypertension: Secondary | ICD-10-CM

## 2024-02-16 DIAGNOSIS — D696 Thrombocytopenia, unspecified: Secondary | ICD-10-CM

## 2024-02-16 NOTE — Progress Notes (Signed)
 PCP MD at time of PV: Butler Der, MD  __________________________________________________________________________________________________________________________________________  No egg allergy known to patient  No soy allergy known to patient No issues known to pt with past sedation with any surgeries or procedures Patient denies ever being told they had issues or difficulty with intubation  No FH of Malignant Hyperthermia Pt is not on diet pills Pt is not on  home 02  Pt is not on blood thinners  No A fib or A flutter Have any cardiac testing pending--no  LOA: independent  No Chew or Snuff tobacco __________________________________________________________________________________________________________________________________________  PV completed with patient. Prep instructions reviewed and provided during apt. Rx sent to preferred pharmacy.

## 2024-02-23 ENCOUNTER — Encounter: Payer: Self-pay | Admitting: Internal Medicine

## 2024-02-23 ENCOUNTER — Ambulatory Visit: Admitting: Internal Medicine

## 2024-02-23 VITALS — BP 117/62 | HR 64 | Temp 98.1°F | Resp 11 | Ht 69.0 in | Wt 264.0 lb

## 2024-02-23 DIAGNOSIS — R1319 Other dysphagia: Secondary | ICD-10-CM

## 2024-02-23 DIAGNOSIS — K222 Esophageal obstruction: Secondary | ICD-10-CM | POA: Diagnosis present

## 2024-02-23 DIAGNOSIS — K219 Gastro-esophageal reflux disease without esophagitis: Secondary | ICD-10-CM

## 2024-02-23 DIAGNOSIS — K449 Diaphragmatic hernia without obstruction or gangrene: Secondary | ICD-10-CM

## 2024-02-23 DIAGNOSIS — D509 Iron deficiency anemia, unspecified: Secondary | ICD-10-CM

## 2024-02-23 DIAGNOSIS — R1011 Right upper quadrant pain: Secondary | ICD-10-CM

## 2024-02-23 MED ORDER — SODIUM CHLORIDE 0.9 % IV SOLN: 500.0000 mL | Freq: Once | INTRAVENOUS | Status: DC

## 2024-02-23 NOTE — Progress Notes (Signed)
 xpand All Collapse All        12/28/2023 Roy Lawrence [id number] 10-Mar-1970     Chief Complaint: Abdominal pain, splenomegaly   History of Present Illness: Roy Lawrence is a 54 year old male with a past medical history of anxiety, depression, hypertension, SVT, asthma, sleep apnea, spondylosis, hepatic steatosis, GERD, diverticulitis and colon polyps.  He is known by Dr. Abran.     He presents to our office today as referred by Dr. Butler Der for further evaluation regarding hepatic steatosis and splenomegaly.  He is accompanied by his wife.  He endorses having RUQ pain which occurs intermittently for the past few months and intermittent LLQ pain which he attributes to having diverticulosis.  No severe LLQ pain at this time.  He was seen by his PCP 12/08/2023 and laboratory studies showed a BUN of 20, creatinine 1.55 up from 1.307 months ago, ALT 67 with normal total bili, alk phos and AST levels.  CBC with a WBC count of 6.9.  Hemoglobin 16.3.  Hematocrit 51.5.  Platelets 132.  He underwent an abdominal sonogram 12/21/2023 which showed evidence of hepatic steatosis and splenomegaly.   In review of his epic records, he has had mild thrombocytopenia since 12/2012.  The patient stated he was seen by hematologist many years ago due to having low platelet count and hematological workup was unrevealing.   He drinks 2 ounces of liquor a few times monthly.  No drug use.  He previously took diclofenac  twice daily which he stopped taking 1-1/2 weeks ago.   His father died from cholangiocarcinoma at the age of 42.  Father also had history of non-Hodgkin's lymphoma and melanoma.   He endorses passing normal brown formed stool most days.  No bloody or black stools.  He has intermittent LLQ pain as noted above.  No significant abdominal pain at this time.         Latest Ref Rng & Units 12/08/2023    9:14 AM 05/20/2023    8:58 AM 11/19/2022    9:53 AM  CBC  WBC 3.4 - 10.8 x10E3/uL 6.9  4.9  5.7    Hemoglobin 13.0 - 17.7 g/dL 83.6  83.5  83.7   Hematocrit 37.5 - 51.0 % 51.5  48.9  50.1   Platelets 150 - 450 x10E3/uL 132  124  124         Latest Ref Rng & Units 12/08/2023    9:14 AM 05/20/2023    8:58 AM 11/19/2022    9:53 AM  CMP  Glucose 70 - 99 mg/dL 897  892  896   BUN 6 - 24 mg/dL 20  18  19    Creatinine 0.76 - 1.27 mg/dL 8.44  8.69  8.62   Sodium 134 - 144 mmol/L 140  140  140   Potassium 3.5 - 5.2 mmol/L 4.9  4.7  4.7   Chloride 96 - 106 mmol/L 100  103  102   CO2 20 - 29 mmol/L 26  23  24    Calcium 8.7 - 10.2 mg/dL 9.5  9.3  9.5   Total Protein 6.0 - 8.5 g/dL 6.6  6.2  6.2   Total Bilirubin 0.0 - 1.2 mg/dL 0.3  0.4  0.4   Alkaline Phos 47 - 123 IU/L 58  60  58   AST 0 - 40 IU/L 34  28  27   ALT 0 - 44 IU/L 67  39  44     Abdominal sonogram  12/21/2023: FINDINGS: Gallbladder: No gallstones or wall thickening visualized. No sonographic Murphy sign noted by sonographer.   Common bile duct: Diameter: Visualized portion measures 4 mm, within normal limits.   Liver: No definitive focal lesion identified. Diffusely increased in parenchymal echogenicity with poor acoustic penetration. Portal vein is patent on color Doppler imaging with normal direction of blood flow towards the liver.   IVC: No abnormality visualized.   Pancreas: Visualized portion unremarkable.   Spleen: Spleen is enlarged spanning 15.7 x 15.5 x 5.7 cm for an estimated volume of 721 ML.   Right Kidney: Length: 10.6 cm. Echogenicity within normal limits. No mass or hydronephrosis visualized.   Left Kidney: Length: 12.3 cm. Echogenicity within normal limits. No hydronephrosis visualized. Benign cyst is noted measuring 12 mm (for which no dedicated imaging follow-up is recommended).   Abdominal aorta: No aneurysm visualized.   Other findings: None.   IMPRESSION: 1. Hepatic steatosis. 2. Splenomegaly.   GI PROCEDURES:    Colonoscopy 02/17/2021 Dr. Abran: - One 1 mm polyp in the rectum,  removed with a cold biopsy forceps. Resected and retrieved.  - Two 2 to 3 mm polyps in the transverse colon, removed with a cold snare. Resected and retrieved.  - Diverticulosis in the entire examined colon. -  Internal hemorrhoids  - 5 year recall colonoscopy  1. Surgical [P], colon, transverse, polyp (2) TUBULAR ADENOMAS NEGATIVE FOR HIGH-GRADE DYSPLASIA AND CARCINOMA 2. Surgical [P], colon, rectum, polyp (1) TUBULAR ADENOMA NEGATIVE FOR HIGH-GRADE DYSPLASIA AND CARCINOMA   Colonoscopy 02/20/2013: Diverticulosis throughout the entire examined colon No polyps Recall colonoscopy 10 years       Past Medical History:  Diagnosis Date   Allergy      SEASONAL   Anxiety     Asthma     Depression     Diverticulitis     Diverticulosis     Esophageal stricture     GERD (gastroesophageal reflux disease)     Hypertension      Prehypertension   Obesity     Sleep apnea     Spondylosis               Past Surgical History:  Procedure Laterality Date   ESOPHAGEAL DILATION       FINGER SURGERY Right      3rd digit; imbedded glass   SHOULDER SURGERY Right      x 2        Medications Ordered Prior to Encounter        Current Outpatient Medications on File Prior to Visit  Medication Sig Dispense Refill   albuterol  (VENTOLIN  HFA) 108 (90 Base) MCG/ACT inhaler Inhale 1 puff into the lungs every 6 (six) hours as needed for wheezing. 18 each 3   ascorbic acid (VITAMIN C) 500 MG tablet Take 500 mg by mouth daily.       BREO ELLIPTA  200-25 MCG/ACT AEPB INHALE 1 PUFF INTO THE LUNGS DAILY 60 each 0   Cholecalciferol (VITAMIN D3) 5000 UNITS CAPS Take 5,000 Units by mouth 2 (two) times daily.       diclofenac  (VOLTAREN ) 75 MG EC tablet TAKE 1 TABLET BY MOUTH TWICE DAILY DAILY FOR muscle and joint pain 180 tablet 0   dicyclomine  (BENTYL ) 20 MG tablet TAKE 1 TABLET BY MOUTH THREE TIMES DAILY BEFORE MEALS 270 tablet 1   esomeprazole  (NEXIUM  24HR) 20 MG capsule Take 1 capsule (20 mg total) by mouth  2 (two) times daily before a meal. 360 capsule 0  fexofenadine  (ALLEGRA ) 180 MG tablet Take 1 tablet (180 mg total) by mouth daily. For allergy symptoms 90 tablet 3   Magnesium  Oxide (MAG-OX PO) Take 1 tablet by mouth daily.        nebivolol  20 MG TABS Take 1 tablet (20 mg total) by mouth daily. 90 tablet 1   Psyllium 30.9 % POWD Drink 1 tablespoon dissolved in water, twice Daily   11   rizatriptan  (MAXALT -MLT) 10 MG disintegrating tablet TAKE 1 TABLET BY MOUTH AS NEEDED FOR MIGRAINE - MAY REPEAT IN 2 HOURS IF NEEDED. 10 tablet 0   Suvorexant  (BELSOMRA ) 20 MG TABS Take 1 tablet (20 mg total) by mouth at bedtime as needed. 90 tablet 1   tadalafil  (CIALIS ) 5 MG tablet Take 1 tablet (5 mg total) by mouth daily. 90 tablet 03   Testosterone  30 MG/ACT SOLN apply 2 pumps TO each ARM ONCE DAILY 180 mL 0   traZODone  (DESYREL ) 150 MG tablet Take 1 tablet (150 mg total) by mouth at bedtime. 90 tablet 1   triamcinolone  cream (KENALOG ) 0.1 % Apply 1 Application topically 3 (three) times daily. Avoid face and genitalia 45 g 0    No current facility-administered medications on file prior to visit.      Allergies      Allergies  Allergen Reactions   Diovan [Valsartan] Swelling   Quinapril Hcl Swelling   Quinapril Hcl        Current Medications, Allergies, Past Medical History, Past Surgical History, Family History and Social History were reviewed in [employer] record.   Review of Systems:   Constitutional: Negative for fever, sweats, chills or weight loss.  Respiratory: Negative for shortness of breath.   Cardiovascular: Negative for chest pain, palpitations and leg swelling.  Gastrointestinal: See HPI.  Musculoskeletal: Negative for back pain or muscle aches.  Neurological: Negative for dizziness, headaches or paresthesias.    Physical Exam: BP (!) 134/90   Pulse 67   Ht 5' 9 (1.753 m)   Wt 274 lb 6.4 oz (124.5 kg)   BMI 40.52 kg/m  General: 54 year old male in no  acute distress in no acute distress. Head: Normocephalic and atraumatic. Eyes: No scleral icterus. Conjunctiva pink . Ears: Normal auditory acuity. Mouth: Dentition intact. No ulcers or lesions.  Lungs: Clear throughout to auscultation. Heart: Regular rate and rhythm, no murmur. Abdomen: Soft, nondistended.  Mild RUQ tenderness and mild tenderness throughout the lower abdomen without rebound or guarding.  No masses or hepatomegaly. Normal bowel sounds x 4 quadrants.  Rectal: Deferred. Musculoskeletal: Symmetrical with no gross deformities. Extremities: No edema. Neurological: Alert oriented x 4. No focal deficits.  Psychological: Alert and cooperative. Normal mood and affect   Assessment and Recommendations:   54 year old male with hepatic steatosis and splenomegaly per abdominal sonogram with mild thrombocytopenia and elevated ALT levels concerning for advanced liver disease/cirrhosis. - Abdominal/pelvic CT with oral and IV contrast to further evaluate the liver, biliary tract and spleen - Check BUNs/creatinine prior to the patient receiving IV contrast - Avoid Diclofenac  and other NSAIDs - CBC, CMP, hepatitis A total antibody, hepatitis B surface antigen, hepatitis B surface antibody, hepatitis B core total antibody, hepatitis C antibody, ANA, SMA, AMA, IgG, alpha-1 antitrypsin level, ceruloplasmin, iron and ferritin - Further recommendations to be determined after the above lab results and CT results reviewed - Reduce carbohydrates in diet ( ie: reduce bread, potato, rice and sweets. Exercise as tolerated to facilitate weight loss.   History of diverticulitis  with pandiverticulosis.  Intermittent LLQ pain likely secondary to diverticular disease. - CTAP as ordered above to also rule out diverticulitis   History of colon polyps.  Colonoscopy 02/2021 identified 3 tubular adenomatous polyps removed from the colon. - Next colon polyp surveillance colonoscopy due 02/2026   GERD - Continue  Esomeprazole  20 mg twice daily   RUQ pain - Await the above lab and CTAP results - Consider EGD if RUQ pain persists - Continue esomeprazole  20 mg twice daily  Patient well-known to me.  Previous H&P as above.  Liver workup reviewed with him today.  No interval changes.  He does report some intermittent solid food dysphagia.  Now for upper endoscopy with esophageal dilation

## 2024-02-23 NOTE — Progress Notes (Signed)
 Sedate, gd SR, tolerated procedure well, VSS, report to RN

## 2024-02-23 NOTE — Progress Notes (Signed)
 Called to room to assist during endoscopic procedure.  Patient ID and intended procedure confirmed with present staff. Received instructions for my participation in the procedure from the performing physician.

## 2024-02-23 NOTE — Patient Instructions (Signed)
 YOU HAD AN ENDOSCOPIC PROCEDURE TODAY AT THE [facility]:   Refer to the procedure report that was given to you for any specific questions about what was found during the examination.  If the procedure report does not answer your questions, please call your gastroenterologist to clarify.  If you requested that your care partner not be given the details of your procedure findings, then the procedure report has been included in a sealed envelope for you to review at your convenience later.  YOU SHOULD EXPECT: Some feelings of bloating in the abdomen. Passage of more gas than usual.  Walking can help get rid of the air that was put into your GI tract during the procedure and reduce the bloating. If you had a lower endoscopy (such as a colonoscopy or flexible sigmoidoscopy) you may notice spotting of blood in your stool or on the toilet paper. If you underwent a bowel prep for your procedure, you may not have a normal bowel movement for a few days.  Please Note:  You might notice some irritation and congestion in your nose or some drainage.  This is from the oxygen used during your procedure.  There is no need for concern and it should clear up in a day or so.  SYMPTOMS TO REPORT IMMEDIATELY:   Following upper endoscopy (EGD)  Vomiting of blood or coffee ground material  New chest pain or pain under the shoulder blades  Painful or persistently difficult swallowing  New shortness of breath  Fever of 100F or higher  Black, tarry-looking stools  Post dilation diet Continue present medications Return to normal activities tomorrow Resume general medical care with your primary provider   For urgent or emergent issues, a gastroenterologist can be reached at any hour by calling [phone]. Do not use MyChart messaging for urgent concerns.    DIET:  We do recommend a small meal at first, but then you may proceed to your regular diet.  Drink plenty of fluids but you should avoid  alcoholic beverages for 24 hours.  ACTIVITY:  You should plan to take it easy for the rest of today and you should NOT DRIVE or use heavy machinery until tomorrow (because of the sedation medicines used during the test).    FOLLOW UP: Our staff will call the number listed on your records the next business day following your procedure.  We will call around 7:15- 8:00 am to check on you and address any questions or concerns that you may have regarding the information given to you following your procedure. If we do not reach you, we will leave a message.     If any biopsies were taken you will be contacted by phone or by letter within the next 1-3 weeks.  Please call us  at [phone] if you have not heard about the biopsies in 3 weeks.    SIGNATURES/CONFIDENTIALITY: You and/or your care partner have signed paperwork which will be entered into your electronic medical record.  These signatures attest to the fact that that the information above on your After Visit Summary has been reviewed and is understood.  Full responsibility of the confidentiality of this discharge information lies with you and/or your care-partner.

## 2024-02-23 NOTE — Op Note (Signed)
 [facility] Patient Name: Roy Lawrence Procedure Date: 02/23/2024 9:14 AM MRN: [id number] Endoscopist: Norleen SAILOR. Abran , MD, [id number] Age: 54 Referring MD:  Date of Birth: 1970-03-15 Gender: Male Account #: [id number] Procedure:                Upper GI endoscopy with esophageal dilation. 20 mm                            max Indications:              Therapeutic procedure, Abdominal pain in the right                            upper quadrant, Dysphagia, Esophageal reflux Medicines:                Monitored Anesthesia Care Procedure:                Pre-Anesthesia Assessment:                           - Prior to the procedure, a History and Physical                            was performed, and patient medications and                            allergies were reviewed. The patient's tolerance of                            previous anesthesia was also reviewed. The risks                            and benefits of the procedure and the sedation                            options and risks were discussed with the patient.                            All questions were answered, and informed consent                            was obtained. Prior Anticoagulants: The patient has                            taken no anticoagulant or antiplatelet agents. ASA                            Grade Assessment: II - A patient with mild systemic                            disease. After reviewing the risks and benefits,                            the patient was deemed in satisfactory condition to  undergo the procedure.                           After obtaining informed consent, the endoscope was                            passed under direct vision. Throughout the                            procedure, the patient's blood pressure, pulse, and                            oxygen saturations were monitored continuously. The                            Olympus Scope [id number]  was introduced through the                            mouth, and advanced to the second part of duodenum.                            The upper GI endoscopy was accomplished without                            difficulty. The patient tolerated the procedure                            well. Scope In: Scope Out: Findings:                 The esophagus revealed a large caliber distal                            esophageal ring. No active inflammation. No                            varices.. After completing the endoscopic survey, A                            TTS dilator was passed through the scope. Dilation                            with an 18-19-20 mm balloon dilator was performed                            to 20 mm. No mucosal trauma                           The stomach revealed hiatal hernia. Otherwise                            normal. No portal hypertensive gastropathy or                            gastric varices.  The examined duodenum was normal.                           The cardia and gastric fundus were normal on                            retroflexion. Complications:            No immediate complications. Estimated Blood Loss:     Estimated blood loss: none. Impression:               1. GERD with esophageal stricture status post                            dilation                           2. Small hiatal hernia                           3. Otherwise unremarkable EGD. No evidence for                            portal hypertension. Recommendation:           - Patient has a contact number available for                            emergencies. The signs and symptoms of potential                            delayed complications were discussed with the                            patient. Return to normal activities tomorrow.                            Written discharge instructions were provided to the                            patient.                            - Post dilation diet.                           - Continue present medications.                           - Resume general medical care with your primary                            provider Norleen SAILOR. Abran, MD 02/23/2024 9:44:38 AM This report has been signed electronically.

## 2024-02-24 ENCOUNTER — Telehealth: Payer: Self-pay

## 2024-02-24 NOTE — Telephone Encounter (Signed)
 Left message on follow up call.

## 2024-03-02 ENCOUNTER — Other Ambulatory Visit: Payer: Self-pay | Admitting: Family Medicine

## 2024-06-05 ENCOUNTER — Ambulatory Visit: Admitting: Family Medicine
